# Patient Record
Sex: Male | Born: 1940 | Race: White | Hispanic: No | Marital: Married | Smoking: Heavy tobacco smoker
Health system: Southern US, Community
[De-identification: ages and names within clinical notes are randomized; demographics above are authoritative.]

## PROBLEM LIST (undated history)

## (undated) DIAGNOSIS — I714 Abdominal aortic aneurysm, without rupture, unspecified: Secondary | ICD-10-CM

## (undated) DIAGNOSIS — E119 Type 2 diabetes mellitus without complications: Secondary | ICD-10-CM

## (undated) HISTORY — PX: OTHER SURGICAL HISTORY: SHX169

## (undated) HISTORY — PX: ABDOMINAL AORTIC ANEURYSM REPAIR: SUR1152

## (undated) HISTORY — DX: Type 2 diabetes mellitus without complications: E11.9

---

## 2014-04-03 ENCOUNTER — Ambulatory Visit: Payer: Self-pay | Admitting: Internal Medicine

## 2014-04-04 ENCOUNTER — Ambulatory Visit: Payer: Self-pay | Admitting: Internal Medicine

## 2014-04-30 ENCOUNTER — Ambulatory Visit: Payer: Self-pay | Admitting: Vascular Surgery

## 2014-04-30 DIAGNOSIS — R079 Chest pain, unspecified: Secondary | ICD-10-CM

## 2014-04-30 LAB — BASIC METABOLIC PANEL
Anion Gap: 6 — ABNORMAL LOW (ref 7–16)
BUN: 14 mg/dL (ref 7–18)
CREATININE: 1.03 mg/dL (ref 0.60–1.30)
Calcium, Total: 9.4 mg/dL (ref 8.5–10.1)
Chloride: 106 mmol/L (ref 98–107)
Co2: 28 mmol/L (ref 21–32)
EGFR (African American): >60
EGFR (Non-African Amer.): >60
GLUCOSE: 200 mg/dL — AB (ref 65–99)
OSMOLALITY: 286 (ref 275–301)
POTASSIUM: 4.1 mmol/L (ref 3.5–5.1)
SODIUM: 140 mmol/L (ref 136–145)

## 2014-04-30 LAB — CBC
HCT: 46.9 % (ref 40.0–52.0)
HGB: 15.7 g/dL (ref 13.0–18.0)
MCH: 33.4 pg (ref 26.0–34.0)
MCHC: 33.5 g/dL (ref 32.0–36.0)
MCV: 100 fL (ref 80–100)
PLATELETS: 165 10*3/uL (ref 150–440)
RBC: 4.7 10*6/uL (ref 4.40–5.90)
RDW: 13.2 % (ref 11.5–14.5)
WBC: 6.7 10*3/uL (ref 3.8–10.6)

## 2014-05-14 ENCOUNTER — Inpatient Hospital Stay: Payer: Self-pay | Admitting: Vascular Surgery

## 2014-05-15 LAB — PROTIME-INR
INR: 1.3
Prothrombin Time: 15.5 secs — ABNORMAL HIGH (ref 11.5–14.7)

## 2014-05-15 LAB — CBC WITH DIFFERENTIAL/PLATELET
BASOS ABS: 0 10*3/uL (ref 0.0–0.1)
Basophil %: 0.5 %
EOS ABS: 0 10*3/uL (ref 0.0–0.7)
Eosinophil %: 0.5 %
HCT: 39.1 % — ABNORMAL LOW (ref 40.0–52.0)
HGB: 13.1 g/dL (ref 13.0–18.0)
LYMPHS ABS: 1.2 10*3/uL (ref 1.0–3.6)
Lymphocyte %: 14.8 %
MCH: 33.1 pg (ref 26.0–34.0)
MCHC: 33.5 g/dL (ref 32.0–36.0)
MCV: 99 fL (ref 80–100)
Monocyte #: 0.8 x10 3/mm (ref 0.2–1.0)
Monocyte %: 10.1 %
NEUTROS PCT: 74.1 %
Neutrophil #: 6.2 10*3/uL (ref 1.4–6.5)
PLATELETS: 130 10*3/uL — AB (ref 150–440)
RBC: 3.95 10*6/uL — ABNORMAL LOW (ref 4.40–5.90)
RDW: 12.8 % (ref 11.5–14.5)
WBC: 8.4 10*3/uL (ref 3.8–10.6)

## 2014-05-15 LAB — COMPREHENSIVE METABOLIC PANEL
ALBUMIN: 3.1 g/dL — AB (ref 3.4–5.0)
ALK PHOS: 69 U/L
Anion Gap: 7 (ref 7–16)
BUN: 10 mg/dL (ref 7–18)
Bilirubin,Total: 0.5 mg/dL (ref 0.2–1.0)
CALCIUM: 8.3 mg/dL — AB (ref 8.5–10.1)
Chloride: 109 mmol/L — ABNORMAL HIGH (ref 98–107)
Co2: 25 mmol/L (ref 21–32)
Creatinine: 1.05 mg/dL (ref 0.60–1.30)
EGFR (African American): >60
EGFR (Non-African Amer.): >60
GLUCOSE: 168 mg/dL — AB (ref 65–99)
Osmolality: 284 (ref 275–301)
Potassium: 3.9 mmol/L (ref 3.5–5.1)
SGOT(AST): 17 U/L (ref 15–37)
SGPT (ALT): 15 U/L
Sodium: 141 mmol/L (ref 136–145)
Total Protein: 6.1 g/dL — ABNORMAL LOW (ref 6.4–8.2)

## 2014-05-15 LAB — PHOSPHORUS: Phosphorus: 2.1 mg/dL — ABNORMAL LOW (ref 2.5–4.9)

## 2014-05-15 LAB — APTT: Activated PTT: 28.1 secs (ref 23.6–35.9)

## 2014-05-15 LAB — MAGNESIUM: Magnesium: 1.6 mg/dL — ABNORMAL LOW

## 2014-05-17 LAB — CBC WITH DIFFERENTIAL/PLATELET
BASOS ABS: 0 10*3/uL (ref 0.0–0.1)
Basophil %: 0.2 %
Eosinophil #: 0.2 10*3/uL (ref 0.0–0.7)
Eosinophil %: 1.9 %
HCT: 39.9 % — ABNORMAL LOW (ref 40.0–52.0)
HGB: 13.7 g/dL (ref 13.0–18.0)
Lymphocyte #: 1.2 10*3/uL (ref 1.0–3.6)
Lymphocyte %: 15.6 %
MCH: 33.1 pg (ref 26.0–34.0)
MCHC: 34.2 g/dL (ref 32.0–36.0)
MCV: 97 fL (ref 80–100)
MONO ABS: 0.9 x10 3/mm (ref 0.2–1.0)
Monocyte %: 10.7 %
NEUTROS PCT: 71.6 %
Neutrophil #: 5.7 10*3/uL (ref 1.4–6.5)
PLATELETS: 149 10*3/uL — AB (ref 150–440)
RBC: 4.13 10*6/uL — AB (ref 4.40–5.90)
RDW: 12.8 % (ref 11.5–14.5)
WBC: 7.9 10*3/uL (ref 3.8–10.6)

## 2014-05-17 LAB — BASIC METABOLIC PANEL
ANION GAP: 7 (ref 7–16)
BUN: 8 mg/dL (ref 7–18)
CALCIUM: 8.7 mg/dL (ref 8.5–10.1)
CHLORIDE: 105 mmol/L (ref 98–107)
Co2: 27 mmol/L (ref 21–32)
Creatinine: 0.95 mg/dL (ref 0.60–1.30)
EGFR (African American): >60
EGFR (Non-African Amer.): >60
GLUCOSE: 113 mg/dL — AB (ref 65–99)
OSMOLALITY: 277 (ref 275–301)
Potassium: 3.6 mmol/L (ref 3.5–5.1)
Sodium: 139 mmol/L (ref 136–145)

## 2014-05-20 DIAGNOSIS — I70219 Atherosclerosis of native arteries of extremities with intermittent claudication, unspecified extremity: Secondary | ICD-10-CM | POA: Insufficient documentation

## 2014-05-20 DIAGNOSIS — I739 Peripheral vascular disease, unspecified: Secondary | ICD-10-CM | POA: Insufficient documentation

## 2014-05-21 ENCOUNTER — Ambulatory Visit: Payer: Self-pay | Admitting: Internal Medicine

## 2014-05-21 ENCOUNTER — Encounter: Payer: Self-pay | Admitting: Internal Medicine

## 2014-05-26 ENCOUNTER — Encounter: Payer: Self-pay | Admitting: Internal Medicine

## 2014-06-17 ENCOUNTER — Encounter: Payer: Self-pay | Admitting: Internal Medicine

## 2014-06-24 ENCOUNTER — Encounter
Admit: 2014-06-24 | Disposition: A | Discharge status: Home / Self Care | Payer: Self-pay | Attending: Internal Medicine | Admitting: Internal Medicine

## 2014-06-24 ENCOUNTER — Encounter: Payer: Self-pay | Admitting: Internal Medicine

## 2014-07-25 ENCOUNTER — Encounter
Admit: 2014-07-25 | Disposition: A | Discharge status: Home / Self Care | Payer: Self-pay | Attending: Internal Medicine | Admitting: Internal Medicine

## 2014-08-18 LAB — SURGICAL PATHOLOGY

## 2014-08-24 NOTE — Op Note (Signed)
PATIENT NAME:  Kurt [name], Adger D MR#:  [id number] DATE OF BIRTH:  02/27/1941  DATE OF PROCEDURE:  05/14/2014  PREOPERATIVE DIAGNOSES:  1.  Abdominal aortic aneurysm.  2.  Peripheral arterial disease with claudication, bilateral lower extremities.  3.  Right external iliac artery occlusion.   POSTOPERATIVE DIAGNOSES:   1.  Abdominal aortic aneurysm.  2.  Peripheral arterial disease with claudication, bilateral lower extremities.  3.  Right external iliac artery occlusion.   PROCEDURE:   1.  Ultrasound guidance for vascular access to bilateral femoral arteries.  2.  Cut down the bilateral femoral arteries for placement of endoprosthesis.  3.  Crosser atherectomy to right iliac artery.  4.  Placement of coil embolization of the right common iliac artery for extravasation and exclusion of aneurysm.  5.  Placement of an aorto-uni-iliac iliac device using a 31 mm proximal Gore Excluder placed twice to create the aorto-uni-iliac device.  6.  Placement of a left iliac extension with an 18 mm bellbottom cuff to the left common iliac artery.  7.  Right common femoral, profunda femoris and superficial femoral endarterectomy.  8.  Placement of a left to right femoral-femoral bypass.  SURGEONS:  Dr. Marlow BaarsJason S. Errik Mitchelle and Dr. Renford DillsGregory G. Schnier.   ANESTHESIA: General.   ESTIMATED BLOOD LOSS: 100 mL.  INDICATIONS AND PROCEDURE:  This is a 74 year old gentleman who I saw in the office for abdominal aortic aneurysm. He also had lifestyle limiting claudication of both lower extremities with multilevel disease.  He had a right external iliac artery occlusion and we planned repair of his aneurysm. We had discussed crossing the occlusion if possible, and treating this with conventional stent graft and if not possible creating an aorto-uni-iliac stent in the femoral-femoral bypass. Risks and benefits were discussed. Informed consent was obtained.   DESCRIPTION OF PROCEDURE: The patient is brought to the vascular  suite and was placed on the table in the supine position. General anesthesia was achieved. The abdomen and groins were sterilely prepped and draped and a sterile surgical field was created. We started with a percutaneous approach with ultrasound guidance for both femoral arteries, and patent areas.  Dr. Gilda CreaseSchnier performed the left and I performed the right, and permanent image was recorded. On the right we upsized to a 6 French sheath and imaging was performed showing the right iliac occlusion with reconstitution in the common iliac artery. We approached this occlusion both from a retrograde fashion from the right common femoral, and an antegrade fashion from an up and over approach from the left femoral and despite multiple attempts with different catheters and wires were unable to cross the occlusion.  We switched out for the crosser atherectomy device using the Usher catheter in the S6 device, but were unable to cross the occlusion and, again, tried from both an antegrade and retrograde fashion. From the antegrade fashion there was actually some extravasation in the distal common iliac artery to treat this area as well as to exclude the aneurysm sac from retrograde flow.  Five 20 mm diameter coils were placed in the right common iliac artery, and we did this from the left femoral approach. We then proceeded with stiff wire and an 18 French sheath on the left. Pigtail catheter was placed up.  We were able to pass into a dissection plane in the common iliac and the aorta from the right femoral approach but could never re-enter the true lumen. The renal arteries were marked at about the same level  and we started with a 31 mm device using a short 13 cm length device.  A second device was placed up through there to create an aorto-uni-iliac device, crushing the contralateral limb and excluding the initial contralateral limb.  This functionally creates an aorto-uni-iliac device that terminated in the proximal left  common iliac artery. 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 At this point, it should be noted we had converted to full femoral artery cut downs for placement of the endoprosthesis on the left and planned femoral-femoral bypass on the right. At this point vertical incisions had been created. The 18 French sheath was removed on the left and the 6 French sheath was removed on the right. Vertical arteriotomies were created, on the right side there was dense plaque in the common femoral, profunda femoris, and proximal superficial femoral artery.  I elected to perform an endarterectomy. a nice eversion endarterectomy was performed on the profunda femoris artery with a very long piece of plaque that was removed and brisk backbleeding. The proximal endpoint was cut flush with tenotomy scissors on the common femoral artery and again, an eversion endarterectomy was performed on the proximal superficial femoral artery with good back bleed. After the specimen was removed all loose flecks were removed and the vessel was locally heparinized. We selected a 6 mm diameter ring Propaten PTFE graft and tunneled this in a suprafascial location. The femoral arteries anastomoses were performed with CV-6 sutures on each side after the grafts were cut and beveled to an appropriate length to match the arteriotomies.  Two CV-6 patch sutures was used on the left, the wounds were then irrigated. There was excellent pulse within the graft and in the femoral arteries distal to the graft on the left and the right. Surgicel and Evicel topical hemostatic agents were placed and hemostasis complete. The wound was then closed with a layer of 2-0 Vicryl, two layers of 3-0 Vicryl and 4-0 Monocryl in each side.  The patient was then awakened from anesthesia and taken  to the recovery room in stable condition, having tolerated the procedure well.      ____________________________ Annice Needy, MD jsd:at D: 05/14/2014 17:08:27 ET T: 05/14/2014 17:49:36 ET JOB#: [id number]  cc: Annice Needy, MD, <Dictator> Jillene Bucks. Arlana Pouch, MD Annice Needy MD ELECTRONICALLY SIGNED 05/15/2014 10:41

## 2014-08-24 NOTE — Op Note (Signed)
PATIENT NAME:  Kurt [name], Kurt [name] MR#:  [id number] DATE OF BIRTH:  1940/09/14  DATE OF PROCEDURE:  05/14/2014  PREOPERATIVE DIAGNOSES:  1. Abdominal aortic aneurysm.  2. Right external iliac occlusion.  3. Atherosclerosis bilateral lower extremities with lifestyle limiting claudication.   POSTOPERATIVE DIAGNOSES:  1. Abdominal aortic aneurysm.  2. Right external iliac occlusion.  3. Atherosclerosis bilateral lower extremities with lifestyle limiting claudication.   PROCEDURES PERFORMED:   1. Endovascular repair of abdominal aortic aneurysm using Gore Excluder in a uni-iliac configuration.  2. Coil embolization of the right common iliac secondary to extravasation.  3. Crosser atherectomy of right external iliac artery unsuccessful, complicated with extravasation of the contrast.  4. Right common femoral artery endarterectomy.  5. Right profunda femoris endarterectomy.  6. Femoral to femoral bypass grafting.   SURGEON: Festus BarrenJason Dew, MD, Levora DredgeGregory Schnier, MD, co-surgeons.   ANESTHESIA: General by endotracheal intubation.   FLUIDS: Per anesthesia record.   ESTIMATED BLOOD LOSS: 100 mL.   FLUOROSCOPY TIME: Per interventional record.    CONTRAST USED: Approximately 70 mL.   COMPLICATIONS: Extravasation during attempted atherectomy and crossing of occluded lesion.   INDICATIONS: Mr. Lanae BoastGarner is a 74 year old gentleman who presented to the office for evaluation of abdominal aortic aneurysm. It was found to be greater than 5 cm and he has been worked up by Dr. Wyn Quakerew for further repair to prevent lethal rupture. Risks and benefits were reviewed with the patient by Dr. Wyn Quakerew. All questions answered. He has agreed to proceed.   DESCRIPTION OF PROCEDURE: The patient is taken to the special procedure suite, placed in the supine position. After adequate general anesthesia is induced and appropriate invasive monitors are placed he is positioned supine and he is prepped and draped in a sterile fashion.  Appropriate timeout is called.   With Dr. Wyn Quakerew working on the right and myself working on the  left, ultrasound is utilized to evaluate the common femoral arteries. Common femoral arteries are identified. They are echolucent and pulsatile indicating patency. Image is recorded for the permanent record and on the right side access is obtained with a Seldinger needle under direct visualization, on the left side access is obtained with a microneedle followed by micro sheath, followed by J-wire and a 6 French sheath. On the left side 2 Perclose devices are then deployed in a pre-close fashion and the sheath is upsized to an 8 [place]French.   Initially working both from the right femoral approach as well as crossing over the bifurcation and working from the left femoral approach, attempts at crossing the occluded iliac on the right side are made. They are not successful. Ultimately a Crosser catheter is deployed from the right as well as from the left. Unfortunately on the left side after it appeared the Crosser had engaged the iliac and had begun to work its way through indeed we encountered extravasation. Based on the inability to cross, but of concern also is the active bleeding, it was elected to coil embolize the common iliac distally. This was done with 6 coils all 20 x 14 length. They were deployed without difficulty. We then committed to an aorta-uni-iliac configuration.   Bilateral femoral cutdowns were then performed, making vertical incisions with Dr. Wyn Quakerew again working on the right and myself working on the left. Common femoral arteries exposed, on the left it is looped proximally and distally, on the right the dissection is carried down to loop the origin of the SFA as well as the profunda femoris. Tunnel  is created with a Gore tunneling device.   The sheath on the left side is then upsized over an Amplatz Super Stiff wire to 18 [place]. Pigtail catheter is then inserted and an AP aortogram is obtained, noting  the level of the renals. Subsequently a main body device, a 31 x 14 x 13 was deployed just below the renals. The second main body 31 by 14 x 13 is then deployed in a reverse orientation into the original, this effectively occludes the contralateral limb extending the ipsilateral down.  An extension with an 18 x 10 extender limb was then deployed creating a seal in the left common iliac. The entire system was then ballooned with a Coda balloon. A 12 x 4 by angioplasty balloon was then also used to dilate an area in the iliac.  Then the system was flushed copiously with heparinized saline. The final imaging of the aneurysm demonstrated excellent flow through the system with no evidence of endoleak.   A 6 mm ringed PTFE graft was then pulled through the subcutaneous tunnel. The proximal and distal left common femoral was occluded as was the right side. Arteriotomy was made with Potts scissors. The graft was beveled and an end graft to side common femoral artery anastomosis was fashioned with running CV-6 suture.   After adjusting for length on the left side again the common femoral artery was opened in a vertical fashion with Potts scissors. Significant plaque was noted and the common femoral and profunda femoris were then treated with femoral endarterectomy and a specimen was sent to pathology for permanent section. The graft was then beveled and an end graft to side femoral artery anastomosis was fashioned with running CV-6 suture. Flushing maneuvers were performed and flow was established through the graft. It should be noted that the flushing maneuvers were performed prior to completing the suture line on the right side to allow for appropriate purging of the graft.   Both suture lines were inspected for hemostasis. Subsequently the wounds were irrigated, reinspected for hemostasis as well, and then Evicel and Surgicel were placed in both wounds and both wounds were closed in layers using 2-0 and 3-0 Vicryl  over approximately 4 levels of closure and then 4-0 Monocryl subcuticular for the skin.   The patient tolerated the procedure well. There were no immediate complications. Sponge and needle counts were correct and he was taken to the recovery area in excellent condition.  ____________________________ Renford Dills, MD ggs:bu [name]: 05/14/2014 17:38:00 ET T: 05/14/2014 20:00:18 ET JOB#: [id number]  cc: Renford Dills, MD, <Dictator> Renford Dills MD ELECTRONICALLY SIGNED 05/28/2014 17:43

## 2014-08-24 NOTE — Discharge Summary (Signed)
PATIENT NAME:  Kurt [name], Champ D MR#:  [id number] DATE OF BIRTH:  1941/02/07  DATE OF ADMISSION:  05/14/2014 DATE OF DISCHARGE:  05/20/2014   ADMITTING AND DISCHARGE DIAGNOSES:  1.  Peripheral arterial disease claudication, bilateral lower extremities.  2.  Abdominal aortic aneurysm.  3.  Right iliac artery occlusion.  4.  Tobacco dependence.   PROCEDURES PERFORMED WHILE IN HOSPITAL: Aorta uni-iliac with femoral to femoral bypass and right femoral endarterectomy.  For full details of that, please see that dictated operative summary.   BRIEF HISTORY: A 74 year old gentleman with the above-mentioned issues. He was prepared for elective surgery to address them as an outpatient and was brought into the hospital on 05/14/2014.   HOSPITAL COURSE: The patient was admitted after surgery on 05/14/2014.  He was monitored in the Critical Care Unit overnight and was able to be transferred to the floor on day 1.  His mobility was reasonably poor to baseline and this did not advance, particularly well, even with physical therapy seeing the patient on multiple occasions.  By postoperative day 3, he was advanced to a regular diet. He was able to be off of IV pain medications and was afebrile. Vital signs stable, but his mobility was not good.  It was recommended he get inpatient rehab at discharge and by postoperative day 6 this was available and ready.  The patient will be discharged to rehab accompanied by family. His diet will be regular. His activity will be as tolerated with physical therapy daily.   He had aspirin, Plavix, and pain medications added after surgery and was resuming all of his previous home medications.  He will see us back in the office in 3 to 4 weeks in follow-up.     ____________________________ Annice NeedyJason S. Brenner Visconti, MD jsd:DT D: 05/20/2014 15:31:57 ET T: 05/20/2014 16:01:49 ET JOB#: [id number]  cc: Annice NeedyJason S. Taraneh Metheney, MD, <Dictator> Annice NeedyJASON S Cassidey Barrales MD ELECTRONICALLY SIGNED 05/28/2014 11:53

## 2015-01-26 ENCOUNTER — Other Ambulatory Visit: Payer: Self-pay | Admitting: Neurology

## 2015-01-26 DIAGNOSIS — M48062 Spinal stenosis, lumbar region with neurogenic claudication: Secondary | ICD-10-CM

## 2015-01-26 DIAGNOSIS — G9519 Other vascular myelopathies: Secondary | ICD-10-CM

## 2015-02-03 ENCOUNTER — Ambulatory Visit
Admission: RE | Admit: 2015-02-03 | Discharge: 2015-02-03 | Disposition: A | Discharge status: Home / Self Care | Payer: Medicare PPO | Source: Ambulatory Visit | Attending: Neurology | Admitting: Neurology

## 2015-02-03 DIAGNOSIS — M4806 Spinal stenosis, lumbar region: Secondary | ICD-10-CM | POA: Diagnosis not present

## 2015-02-03 DIAGNOSIS — G9519 Other vascular myelopathies: Secondary | ICD-10-CM

## 2015-02-03 DIAGNOSIS — M48062 Spinal stenosis, lumbar region with neurogenic claudication: Secondary | ICD-10-CM

## 2015-02-18 ENCOUNTER — Ambulatory Visit: Payer: Medicare PPO | Attending: Neurology | Admitting: Physical Therapy

## 2015-02-18 ENCOUNTER — Encounter: Payer: Self-pay | Admitting: Physical Therapy

## 2015-02-18 DIAGNOSIS — R2681 Unsteadiness on feet: Secondary | ICD-10-CM | POA: Diagnosis present

## 2015-02-18 DIAGNOSIS — R262 Difficulty in walking, not elsewhere classified: Secondary | ICD-10-CM | POA: Diagnosis present

## 2015-02-18 DIAGNOSIS — R531 Weakness: Secondary | ICD-10-CM | POA: Diagnosis present

## 2015-02-18 NOTE — Therapy (Signed)
[facility] MAIN [facility] SERVICES [address] [place], [place], [address] Phone: [phone]   Fax:  [phone]  Physical Therapy Evaluation  Patient Details  Name: Kurt [name] MRN: [id number] Date of Birth: 02/13/1941 Referring Provider: shah  Encounter Date: 02/18/2015      PT End of Session - 02/18/15 1002    Visit Number 1   Number of Visits 25   Date for PT Re-Evaluation May 18, 2014   Authorization Type g codes   PT Start Time 0915   PT Stop Time 1015   PT Time Calculation (min) 60 min   Equipment Utilized During Treatment Gait belt   Activity Tolerance Patient limited by lethargy;Patient limited by fatigue;Patient limited by pain   Behavior During Therapy [facility] for tasks assessed/performed      No past medical history on file.  No past surgical history on file.  There were no vitals filed for this visit.  Visit Diagnosis:  Weakness  Unsteady gait  Difficulty walking      Subjective Assessment - 02/18/15 0923    Subjective Patient has unsteady gait and uses a spc outdoors.    Patient is accompained by: Family member   Currently in Pain? Yes   Pain Score 0-No pain   Pain Location Hip   Pain Orientation Right   Pain Descriptors / Indicators Aching   Pain Type Chronic pain   Pain Onset More than a month ago   Pain Frequency Intermittent        PAIN: intermittent right hip pain 0/10-8/10  POSTURE: WFL   PROM/AROM:  STRENGTH:  Graded on a 0-5 scale Muscle Group Left Right  Shoulder flex    Shoulder Abd    Shoulder Ext    Shoulder IR/ER    Elbow    Wrist/hand    Hip Flex 3+ 3+  Hip Abd 3 3  Hip Add 2 2  Hip Ext 2 2  Hip IR/ER 3 3  Knee Flex 4 4  Knee Ext 4 4  Ankle DF 4 4  Ankle PF 4 4   SENSATION: Numbness to RLE calf intermittent      FUNCTIONAL MOBILITY:   BALANCE: 44/56 berg balance, unable to single leg stand , unable to tandem stand   GAIT: Patient is able to ambulate with spc and  shuffling feet, decreased step length and decreased step height  OUTCOME MEASURES: TEST Outcome Interpretation  5 times sit<>stand 18.74sec >11 yo, >15 sec indicates increased risk for falls  10 meter walk test    . 76             m/s <1.0 m/s indicates increased risk for falls; limited community ambulator  Timed up and Go   14.16               sec <14 sec indicates increased risk for falls  6 minute walk test    650            Feet 1000 feet is community Forensic scientist Assessment 44/56 <36/56 (100% risk for falls), 37-45 (80% risk for falls); 46-51 (>50% risk for falls); 52-55 (lower risk <25% of falls)             [facility] PT Assessment - 02/18/15 0001    Assessment   Medical Diagnosis unsteady gait   Referring Provider shah   Hand Dominance Right   Next MD Visit 04/25/14   Balance Screen   Has the patient fallen  in the past 6 months Yes   How many times? 7   Has the patient had a decrease in activity level because of a fear of falling?  Yes   Is the patient reluctant to leave their home because of a fear of falling?  No   Home [employer] manag[employer]vironment   Living Environment Private residence   Living Arrangements Spouse/significant other   Available Help at Discharge Family   Type of Home House   Home Access Stairs to enter   Entrance Stairs-Number of Steps 2   Entrance Stairs-Rails Left   Home Layout One level   Home Equipment Walker - 2 wheels;Cane - single point   Prior Function   Level of Independence Independent   Vocation Retired     Therapeutic exercise: Leg press  90 lbsx 20 x 3 sets, heel raises 90 lbs x 20 x 3 sets Standing 4 way hip YTB x 20                           PT Long Term Goals - 02/18/15 1006    PT LONG TERM GOAL #1   Title Patient will increase BLE gross strength to 4+/5 as to improve functional strength for independent gait, increased standing tolerance and increased ADL ability   Time 12   Period Weeks   Status New   PT LONG  TERM GOAL #2   Title Patient will report a worst pain of 3/10 on VAS in right hip            to improve tolerance with ADLs and reduced symptoms with activities.    Time 12   Status New   PT LONG TERM GOAL #3   Title Patient will tolerate 5 seconds of single leg stance without loss of balance to improve ability to get in and out of shower safely.   Time 12   Period Weeks   Status New   PT LONG TERM GOAL #4   Title Patient will demonstrate an improved Berg Balance Score of > as to demonstrate improved balance with ADLs such as sitting/standing and transfer balance and reduced fall risk   Time 12   Period Weeks   Status New   PT LONG TERM GOAL #5   Title Patient will increase six minute walk test distance to >1000 for progression to community ambulator and improve gait ability   Time 12   Period Weeks   Status New               Plan - 02/18/15 1003    Clinical Impression Statement Patient is 74 yr old male with unsteady gait, decreased dynamic standing balance, decreased strength and right hip pain. He has decreased outcome measures  that indicate increased falls risk.    Pt will benefit from skilled therapeutic intervention in order to improve on the following deficits Abnormal gait;Decreased balance;Decreased endurance;Decreased mobility;Difficulty walking;Dizziness;Decreased activity tolerance;Decreased strength;Pain   Rehab Potential Good   PT Frequency 2x / week   PT Duration 12 weeks   PT Treatment/Interventions Manual techniques;Balance training;Neuromuscular re-education;Therapeutic exercise;Therapeutic activities;Gait training;Stair training;Cryotherapy;Canalith Repostioning;Electrical Stimulation   PT Next Visit Plan Elys maneuver, balance training, strengthening   PT Home Exercise Plan 4 way hip, sit to stand   Consulted and Agree with Plan of Care Patient          G-Codes - 02/18/15 1016    Functional Assessment Tool Used tug, 5 x sit to stand,  BERG, 6 MW    Functional Limitation Mobility: Walking and moving around   Mobility: Walking and Moving Around Current Status [phone]) At least 40 percent but less than 60 percent impaired, limited or restricted   Mobility: Walking and Moving Around Goal Status [phone]) At least 20 percent but less than 40 percent impaired, limited or restricted       Problem List There are no active problems to display for this patient.   Kurt [name] 02/18/2015, 10:18 AM  [facility] MAIN [facility] SERVICES [address] [place], [place], [address] Phone: [phone]   Fax:  [phone]  Name: Kurt [name] MRN: [id number] Date of Birth: 03-Jul-1940

## 2015-02-23 ENCOUNTER — Ambulatory Visit: Payer: Medicare PPO | Admitting: Physical Therapy

## 2015-02-23 ENCOUNTER — Encounter: Payer: Self-pay | Admitting: Physical Therapy

## 2015-02-23 DIAGNOSIS — R531 Weakness: Secondary | ICD-10-CM | POA: Diagnosis not present

## 2015-02-23 DIAGNOSIS — R262 Difficulty in walking, not elsewhere classified: Secondary | ICD-10-CM

## 2015-02-23 DIAGNOSIS — R2681 Unsteadiness on feet: Secondary | ICD-10-CM

## 2015-02-23 NOTE — Therapy (Signed)
[place] [facility] MAIN [facility] SERVICES [address] [place], [place], [address] Phone: [phone]   Fax:  [phone]  Physical Therapy Treatment  Patient Details  Name: Kurt [name] MRN: [id number] Date of Birth: 10-06-1940 Referring Provider: shah  Encounter Date: 02/23/2015      PT End of Session - 02/23/15 0854    Visit Number 2   Number of Visits 25   Date for PT Re-Evaluation 05/26/2014   Authorization Type g codes   PT Start Time 0830   PT Stop Time 0915   PT Time Calculation (min) 45 min   Equipment Utilized During Treatment Gait belt   Activity Tolerance Patient limited by lethargy;Patient limited by fatigue;Patient limited by pain   Behavior During Therapy [facility] for tasks assessed/performed      No past medical history on file.  No past surgical history on file.  There were no vitals filed for this visit.  Visit Diagnosis:  Weakness  Unsteady gait  Difficulty walking      Subjective Assessment - 02/23/15 0852    Subjective Patient has unsteady gait and uses a spc outdoors. Patient is not having any pain today in his hip     standing on balance foam and tapping 6 inch stool Standing from stool and eccentric step downs x 10 BLE  standing hip abd with YTB x 20  side stepping left and right in parallel bars 10 feet x 3 standing on blue foam with cone reaching x 20 across midline step ups from floor to 6 inch stool x 20 bilateral sit to stand x 10 marching in parallel bars x 20 stepping pattern with weight shifting fwd/bwd x 10.  Min cueing needed to appropriately perform  tasks with leg, hand, and head position. Decreased coordination demonstrated requiring consistent verbal cueing to correct form. Patient continues to demonstrate some in coordination of movement with select exercises such as rock and reach and stepping backwards. Patient responds well to verbal and tactile cues to correct form and technique.  CGA to SBA for  safety with activities.                          PT Education - 02/23/15 0854    Education provided Yes   Person(s) Educated Patient   Methods Explanation   Comprehension Verbalized understanding             PT Long Term Goals - 02/18/15 1006    PT LONG TERM GOAL #1   Title Patient will increase BLE gross strength to 4+/5 as to improve functional strength for independent gait, increased standing tolerance and increased ADL ability   Time 12   Period Weeks   Status New   PT LONG TERM GOAL #2   Title Patient will report a worst pain of 3/10 on VAS in right hip            to improve tolerance with ADLs and reduced symptoms with activities.    Time 12   Status New   PT LONG TERM GOAL #3   Title Patient will tolerate 5 seconds of single leg stance without loss of balance to improve ability to get in and out of shower safely.   Time 12   Period Weeks   Status New   PT LONG TERM GOAL #4   Title Patient will demonstrate an improved Berg Balance Score of > as to demonstrate improved balance with ADLs  such as sitting/standing and transfer balance and reduced fall risk   Time 12   Period Weeks   Status New   PT LONG TERM GOAL #5   Title Patient will increase six minute walk test distance to >1000 for progression to community ambulator and improve gait ability   Time 12   Period Weeks   Status New               Plan - 02/23/15 [id number]    Clinical Impression Statement PT provided min - moderate verbal instruction to improve set up, proper use of UE, and improved posture and gait mechanics. Patient responded moderately to instruction   Pt will benefit from skilled therapeutic intervention in order to improve on the following deficits Abnormal gait;Decreased balance;Decreased endurance;Decreased mobility;Difficulty walking;Dizziness;Decreased activity tolerance;Decreased strength;Pain   Rehab Potential Good   PT Frequency 2x / week   PT Duration 12 weeks    PT Treatment/Interventions Manual techniques;Balance training;Neuromuscular re-education;Therapeutic exercise;Therapeutic activities;Gait training;Stair training;Cryotherapy;Canalith Repostioning;Electrical Stimulation   PT Next Visit Plan Elys maneuver, balance training, strengthening   PT Home Exercise Plan 4 way hip, sit to stand   Consulted and Agree with Plan of Care Patient        Problem List There are no active problems to display for this patient.   Ezekiel InaMansfield, Dina Mobley S 02/23/2015, 9:01 AM  [place] [facility] [address] [place], [place], [address] Phone: [phone]   Fax:  [phone]  Name: Kurt [name] MRN: [id number] Date of Birth: 16-Sep-1940

## 2015-02-25 ENCOUNTER — Encounter: Payer: Self-pay | Admitting: Physical Therapy

## 2015-02-25 ENCOUNTER — Ambulatory Visit: Payer: Medicare PPO | Attending: Neurology | Admitting: Physical Therapy

## 2015-02-25 DIAGNOSIS — R2681 Unsteadiness on feet: Secondary | ICD-10-CM | POA: Diagnosis present

## 2015-02-25 DIAGNOSIS — R262 Difficulty in walking, not elsewhere classified: Secondary | ICD-10-CM | POA: Insufficient documentation

## 2015-02-25 DIAGNOSIS — R531 Weakness: Secondary | ICD-10-CM

## 2015-02-25 NOTE — Therapy (Signed)
[place] [facility] [address] [place], [place], [address] Phone: [phone]   Fax:  [phone]  Physical Therapy Treatment  Patient Details  Name: Kurt [name] MRN: [id number] Date of Birth: Oct 09, 1940 Referring Provider: shah  Encounter Date: 02/25/2015      PT End of Session - 02/25/15 0853    Visit Number 3   Number of Visits 25   Date for PT Re-Evaluation 05/12/14   Authorization Type g codes   PT Start Time 0830   PT Stop Time 0915   PT Time Calculation (min) 45 min   Equipment Utilized During Treatment Gait belt   Activity Tolerance Patient limited by lethargy;Patient limited by fatigue;Patient limited by pain   Behavior During Therapy [facility] for tasks assessed/performed      History reviewed. No pertinent past medical history.  History reviewed. No pertinent past surgical history.  There were no vitals filed for this visit.  Visit Diagnosis:  Weakness  Unsteady gait  Difficulty walking      Subjective Assessment - 02/25/15 0852    Subjective Patient has unsteady gait and uses a spc outdoors. Patient is not having any pain today in his hip, but he feels a little light headed.   Patient is accompained by: Family member   Pain Score 0-No pain   Pain Onset More than a month ago        standing hip abd with YTB x 20 x 2 Eccentric step downs from 6 inch stool x 10 x 2 Standing on 1/2 foam with correct head position side stepping left and right in parallel bars 10 feet x 3 standing on blue foam with cone reaching x 20 across midline step ups from floor to 6 inch stool x 20 bilateral sit to stand x 10 marching in parallel bars x 20 stepping pattern with weight shifting fwd/bwd x 10. . Min cueing needed to appropriately perform tasks with leg, hand, and head position. Decreased coordination demonstrated requiring consistent verbal cueing to correct form.  Patient continues to demonstrate some in  coordination of movement with select exercises such as stepping backwards. Patient responds well to verbal and tactile cues to correct form and technique.  CGA to SBA for safety with activities.  Uses to increase intensity of movements throughout session                          PT Education - 02/25/15 0853    Education provided Yes   Person(s) Educated Patient   Methods Explanation   Comprehension Verbalized understanding             PT Long Term Goals - 02/18/15 1006    PT LONG TERM GOAL #1   Title Patient will increase BLE gross strength to 4+/5 as to improve functional strength for independent gait, increased standing tolerance and increased ADL ability   Time 12   Period Weeks   Status New   PT LONG TERM GOAL #2   Title Patient will report a worst pain of 3/10 on VAS in right hip            to improve tolerance with ADLs and reduced symptoms with activities.    Time 12   Status New   PT LONG TERM GOAL #3   Title Patient will tolerate 5 seconds of single leg stance without loss of balance to improve ability to get in and out of shower  safely.   Time 12   Period Weeks   Status New   PT LONG TERM GOAL #4   Title Patient will demonstrate an improved Berg Balance Score of > as to demonstrate improved balance with ADLs such as sitting/standing and transfer balance and reduced fall risk   Time 12   Period Weeks   Status New   PT LONG TERM GOAL #5   Title Patient will increase six minute walk test distance to >1000 for progression to community ambulator and improve gait ability   Time 12   Period Weeks   Status New               Plan - 02/25/15 0854    Clinical Impression Statement Patient is negative for Epley test L and R . Patient feels light headed and he tested his sugar today and it was 175 this AM.  He participates in strengthening and balance training with improvement with practice.    Pt will benefit from skilled therapeutic intervention  in order to improve on the following deficits Abnormal gait;Decreased balance;Decreased endurance;Decreased mobility;Difficulty walking;Dizziness;Decreased activity tolerance;Decreased strength;Pain   Rehab Potential Good   PT Frequency 2x / week   PT Duration 12 weeks   PT Treatment/Interventions Manual techniques;Balance training;Neuromuscular re-education;Therapeutic exercise;Therapeutic activities;Gait training;Stair training;Cryotherapy;Canalith Repostioning;Electrical Stimulation   PT Next Visit Plan Elys maneuver, balance training, strengthening   PT Home Exercise Plan 4 way hip, sit to stand   Consulted and Agree with Plan of Care Patient        Problem List There are no active problems to display for this patient.   Ezekiel Ina 02/25/2015, 9:16 AM  [place] [facility] MAIN [facility] SERVICES [address] [place], [place], [address] Phone: [phone]   Fax:  [phone]  Name: Kurt [name] MRN: [id number] Date of Birth: 25-Jul-1940

## 2015-03-02 ENCOUNTER — Encounter: Payer: Self-pay | Admitting: Physical Therapy

## 2015-03-02 ENCOUNTER — Ambulatory Visit: Payer: Medicare PPO | Admitting: Physical Therapy

## 2015-03-02 DIAGNOSIS — R531 Weakness: Secondary | ICD-10-CM | POA: Diagnosis not present

## 2015-03-02 DIAGNOSIS — R262 Difficulty in walking, not elsewhere classified: Secondary | ICD-10-CM

## 2015-03-02 DIAGNOSIS — R2681 Unsteadiness on feet: Secondary | ICD-10-CM

## 2015-03-02 NOTE — Therapy (Signed)
[facility] [address] [place], [place], [address] Phone: [phone]   Fax:  [phone]  Physical Therapy Treatment  Patient Details  Name: Kurt GipLarry D [name] MRN: [id number] Date of Birth: 1940/11/26 Referring Provider: shah  Encounter Date: 03/02/2015      PT End of Session - 03/02/15 0839    Visit Number 4   Number of Visits 25   Date for PT Re-Evaluation 05/12/14   Authorization Type g codes   PT Start Time 0830   PT Stop Time 0915   PT Time Calculation (min) 45 min   Equipment Utilized During Treatment Gait belt   Activity Tolerance Patient limited by lethargy;Patient limited by fatigue;Patient limited by pain   Behavior During Therapy [facility] for tasks assessed/performed      History reviewed. No pertinent past medical history.  History reviewed. No pertinent past surgical history.  There were no vitals filed for this visit.  Visit Diagnosis:  Weakness  Unsteady gait  Difficulty walking  leg press 90 lbs x 20 x 3 Eccentric step downs x 10 x 3 BLE  standing hip abd with YTB x 20  side stepping left and right in parallel bars 10 feet x 3 standing on blue foam with cone reaching x 20 across midline step ups from floor to 6 inch stool x 20 bilateral sit to stand x 10 marching in parallel bars x 20 stepping pattern with weight shifting fwd/bwd x 10.  Min cueing needed to appropriately perform  tasks with leg, hand, and head position. . Patient continues to demonstrate some in coordination of movement with select exercises such as rock and reach and stepping backwards. Patient responds well to verbal and tactile cues to correct form and technique.  CGA to SBA for safety with activities.  Uses to increase intensity and amplitude of movements throughout session                            PT Education - 03/02/15 0839    Education provided Yes   Education Details progression of strengthening  exercises   Person(s) Educated Patient   Methods Explanation   Comprehension Verbalized understanding             PT Long Term Goals - 02/18/15 1006    PT LONG TERM GOAL #1   Title Patient will increase BLE gross strength to 4+/5 as to improve functional strength for independent gait, increased standing tolerance and increased ADL ability   Time 12   Period Weeks   Status New   PT LONG TERM GOAL #2   Title Patient will report a worst pain of 3/10 on VAS in right hip            to improve tolerance with ADLs and reduced symptoms with activities.    Time 12   Status New   PT LONG TERM GOAL #3   Title Patient will tolerate 5 seconds of single leg stance without loss of balance to improve ability to get in and out of shower safely.   Time 12   Period Weeks   Status New   PT LONG TERM GOAL #4   Title Patient will demonstrate an improved Berg Balance Score of > as to demonstrate improved balance with ADLs such as sitting/standing and transfer balance and reduced fall risk   Time 12   Period Weeks   Status New   PT  LONG TERM GOAL #5   Title Patient will increase six minute walk test distance to >1000 for progression to community ambulator and improve gait ability   Time 12   Period Weeks   Status New               Plan - 03/02/15 0840    Clinical Impression Statement Patient reports that his right hip is not hurting this AM. He is able to perform LE strengthening exercises and balance training without increased pdain.    Pt will benefit from skilled therapeutic intervention in order to improve on the following deficits Abnormal gait;Decreased balance;Decreased endurance;Decreased mobility;Difficulty walking;Dizziness;Decreased activity tolerance;Decreased strength;Pain   Rehab Potential Good   PT Frequency 2x / week   PT Duration 12 weeks   PT Treatment/Interventions Manual techniques;Balance training;Neuromuscular re-education;Therapeutic exercise;Therapeutic  activities;Gait training;Stair training;Cryotherapy;Canalith Repostioning;Electrical Stimulation   PT Next Visit Plan Elys maneuver, balance training, strengthening   PT Home Exercise Plan 4 way hip, sit to stand   Consulted and Agree with Plan of Care Patient        Problem List There are no active problems to display for this patient.   Ezekiel Ina 03/02/2015, 8:41 AM  [place] [facility] MAIN [facility] SERVICES [address] [place], [place], [address] Phone: [phone]   Fax:  [phone]  Name: Kurt [name] MRN: [id number] Date of Birth: Sep 06, 1940

## 2015-03-04 ENCOUNTER — Encounter: Payer: Self-pay | Admitting: Physical Therapy

## 2015-03-04 ENCOUNTER — Ambulatory Visit: Payer: Medicare PPO | Admitting: Physical Therapy

## 2015-03-04 DIAGNOSIS — R531 Weakness: Secondary | ICD-10-CM

## 2015-03-04 DIAGNOSIS — R2681 Unsteadiness on feet: Secondary | ICD-10-CM

## 2015-03-04 DIAGNOSIS — R262 Difficulty in walking, not elsewhere classified: Secondary | ICD-10-CM

## 2015-03-04 NOTE — Therapy (Signed)
[facility] [facility] [address] [place], [place], [address] Phone: [phone]   Fax:  [phone]  Physical Therapy Treatment  Patient Details  Name: Kurt [name] MRN: [id number] Date of Birth: 1940/11/08 Referring Provider: shah  Encounter Date: 03/04/2015      PT End of Session - 03/04/15 0850    Visit Number 5   Number of Visits 25   Date for PT Re-Evaluation 05/12/14   Authorization Type g codes   PT Start Time 0836   PT Stop Time 0915   PT Time Calculation (min) 39 min   Equipment Utilized During Treatment Gait belt   Activity Tolerance Patient limited by lethargy;Patient limited by fatigue;Patient limited by pain   Behavior During Therapy [facility] for tasks assessed/performed      History reviewed. No pertinent past medical history.  History reviewed. No pertinent past surgical history.  There were no vitals filed for this visit.  Visit Diagnosis:  Weakness  Unsteady gait  Difficulty walking      Subjective Assessment - 03/04/15 0848    Subjective Patient is not having right leg pain but does have some soreness.    Patient is accompained by: Family member   Pain Onset More than a month ago      standing hip abd with YTB x 20  side stepping left and right in parallel bars 10 feet x 3 standing on blue foam with cone reaching x 20 across midline step ups from floor to 6 inch stool x 20 bilateral sit to stand x 10 marching in parallel bars x 20 stepping pattern with weight shifting fwd/bwd x 10.  Patient needs occasional verbal cueing to improve posture and cueing to correctly perform exercises slowly, holding at end of range to increase motor firing of desired muscle to encourage fatigue.                            PT Education - 03/04/15 0849    Education provided Yes   Education Details progression of HEP   Person(s) Educated Patient   Methods Explanation   Comprehension  Verbalized understanding             PT Long Term Goals - 02/18/15 1006    PT LONG TERM GOAL #1   Title Patient will increase BLE gross strength to 4+/5 as to improve functional strength for independent gait, increased standing tolerance and increased ADL ability   Time 12   Period Weeks   Status New   PT LONG TERM GOAL #2   Title Patient will report a worst pain of 3/10 on VAS in right hip            to improve tolerance with ADLs and reduced symptoms with activities.    Time 12   Status New   PT LONG TERM GOAL #3   Title Patient will tolerate 5 seconds of single leg stance without loss of balance to improve ability to get in and out of shower safely.   Time 12   Period Weeks   Status New   PT LONG TERM GOAL #4   Title Patient will demonstrate an improved Berg Balance Score of > as to demonstrate improved balance with ADLs such as sitting/standing and transfer balance and reduced fall risk   Time 12   Period Weeks   Status New   PT LONG TERM GOAL #5   Title  Patient will increase six minute walk test distance to >1000 for progression to community ambulator and improve gait ability   Time 12   Period Weeks   Status New               Plan - 03/04/15 1610    Clinical Impression Statement PT provided min - moderate verbal instruction to improve set up, proper use of UE, and improved posture and gait mechanics. Patient responded moderately to instruction   Pt will benefit from skilled therapeutic intervention in order to improve on the following deficits Abnormal gait;Decreased balance;Decreased endurance;Decreased mobility;Difficulty walking;Dizziness;Decreased activity tolerance;Decreased strength;Pain   Rehab Potential Good   PT Frequency 2x / week   PT Duration 12 weeks   PT Treatment/Interventions Manual techniques;Balance training;Neuromuscular re-education;Therapeutic exercise;Therapeutic activities;Gait training;Stair training;Cryotherapy;Canalith  Repostioning;Electrical Stimulation   PT Next Visit Plan Elys maneuver, balance training, strengthening   PT Home Exercise Plan 4 way hip, sit to stand   Consulted and Agree with Plan of Care Patient        Problem List There are no active problems to display for this patient.   Ezekiel Ina 03/04/2015, 8:53 AM  [name] MAIN [facility] SERVICES [address] [place], [place], [address] Phone: [phone]   Fax:  [phone]  Name: Kurt [name] MRN: [id number] Date of Birth: October 02, 1940

## 2015-03-09 ENCOUNTER — Ambulatory Visit: Payer: Medicare PPO | Admitting: Physical Therapy

## 2015-03-09 ENCOUNTER — Encounter: Payer: Self-pay | Admitting: Physical Therapy

## 2015-03-09 DIAGNOSIS — R262 Difficulty in walking, not elsewhere classified: Secondary | ICD-10-CM

## 2015-03-09 DIAGNOSIS — R531 Weakness: Secondary | ICD-10-CM | POA: Diagnosis not present

## 2015-03-09 DIAGNOSIS — R2681 Unsteadiness on feet: Secondary | ICD-10-CM

## 2015-03-09 NOTE — Therapy (Signed)
[place] [facility] MAIN [facility] SERVICES [address] [place], [place], [address] Phone: [phone]   Fax:  [phone]  Physical Therapy Treatment  Patient Details  Name: Kurt [name] MRN: [id number] Date of Birth: [birth year] Referring Provider: shah  Encounter Date: 03/09/2015      PT End of Session - 03/09/15 0838    Visit Number 6   Number of Visits 25   Date for PT Re-Evaluation 05/12/14   PT Start Time 0830   PT Stop Time 0910   PT Time Calculation (min) 40 min      History reviewed. No pertinent past medical history.  History reviewed. No pertinent past surgical history.  There were no vitals filed for this visit.  Visit Diagnosis:  Weakness  Unsteady gait  Difficulty walking      Subjective Assessment - 03/09/15 0837    Subjective Patient is having pain today in both legs 4/10.   Patient is accompained by: Family member   Pain Score 4    Pain Location Leg   Pain Orientation Right;Left   Pain Descriptors / Indicators Aching   Pain Onset More than a month ago      Neuromuscular training: Blue foam with head turns left and right, catching a ball, tandem stand on blue foam and modified tandem on blue foam, cone reaching on foam 1/2 foam with static stand,  Walking on blue foam balance beam, side stepping on blue foam balance , balloon tapping on blue foam 4 square side stepping, fwd/bwd stepping, diagonal stepping left and diagonal stepping right CGA and Min to mod verbal cues used throughout with increased in postural sway and LOB most seen with narrow base of support and while on uneven surfaces.                           PT Education - 03/09/15 [id number]    Education provided Yes   Education Details HEP   Person([name]) Educated Patient   Methods Explanation   Comprehension Verbalized understanding             PT Long Term Goals - 02/18/15 1006    PT LONG TERM GOAL #1   Title Patient will  increase BLE gross strength to 4+/5 as to improve functional strength for independent gait, increased standing tolerance and increased ADL ability   Time 12   Period Weeks   Status New   PT LONG TERM GOAL #2   Title Patient will report a worst pain of 3/10 on VAS in right hip            to improve tolerance with ADLs and reduced symptoms with activities.    Time 12   Status New   PT LONG TERM GOAL #3   Title Patient will tolerate 5 seconds of single leg stance without loss of balance to improve ability to get in and out of shower safely.   Time 12   Period Weeks   Status New   PT LONG TERM GOAL #4   Title Patient will demonstrate an improved Berg Balance Score of > as to demonstrate improved balance with ADLs such as sitting/standing and transfer balance and reduced fall risk   Time 12   Period Weeks   Status New   PT LONG TERM GOAL #5   Title Patient will increase six minute walk test distance to >1000 for progression to community ambulator and improve gait ability  Time 12   Period Weeks   Status New               Plan - 03/09/15 [id number]    Clinical Impression Statement Patient performs intermediate level exercises without pain behaviors and needs verbal cuing for postural alignment and head positioning Tactile cues and assistance needed to keep lower leg and knee in neutral to avoid compensations with ankle motion   Pt will benefit from skilled therapeutic intervention in order to improve on the following deficits Abnormal gait;Decreased balance;Decreased endurance;Decreased mobility;Difficulty walking;Dizziness;Decreased activity tolerance;Decreased strength;Pain   Rehab Potential Good   PT Frequency 2x / week   PT Duration 12 weeks   PT Treatment/Interventions Manual techniques;Balance training;Neuromuscular re-education;Therapeutic exercise;Therapeutic activities;Gait training;Stair training;Cryotherapy;Canalith Repostioning;Electrical Stimulation   PT Next Visit Plan Elys  maneuver, balance training, strengthening   PT Home Exercise Plan 4 way hip, sit to stand   Consulted and Agree with Plan of Care Patient        Problem List There are no active problems to display for this patient.   Kurt [name], Kurt [name] 03/09/2015, 8:40 AM  [place] [facility] [address] [place], [place], [address] Phone: [phone]   Fax:  [phone]  Name: Kurt [name] MRN: [id number] Date of Birth: [birth year]

## 2015-03-11 ENCOUNTER — Encounter: Payer: Self-pay | Admitting: Physical Therapy

## 2015-03-11 ENCOUNTER — Ambulatory Visit: Payer: Medicare PPO | Admitting: Physical Therapy

## 2015-03-11 DIAGNOSIS — R2681 Unsteadiness on feet: Secondary | ICD-10-CM

## 2015-03-11 DIAGNOSIS — R531 Weakness: Secondary | ICD-10-CM | POA: Diagnosis not present

## 2015-03-11 DIAGNOSIS — R262 Difficulty in walking, not elsewhere classified: Secondary | ICD-10-CM

## 2015-03-11 NOTE — Therapy (Addendum)
[place] [facility] MAIN [facility] SERVICES [address] [place], [place], [address] Phone: [phone]   Fax:  [phone]  Physical Therapy Treatment  Patient Details  Name: Kurt [name] MRN: [id number] Date of Birth: 1941-04-07 Referring Provider: shah  Encounter Date: 03/11/2015      PT End of Session - 03/11/15 0854    Visit Number 7   Number of Visits 25   Date for PT Re-Evaluation 05/12/14   PT Start Time 0830   PT Stop Time 0910   PT Time Calculation (min) 40 min   Equipment Utilized During Treatment Gait belt   Activity Tolerance Patient limited by lethargy;Patient limited by fatigue;Patient limited by pain   Behavior During Therapy [facility] for tasks assessed/performed      No past medical history on file.  No past surgical history on file.  There were no vitals filed for this visit.  Visit Diagnosis:  Weakness  Unsteady gait  Difficulty walking      Subjective Assessment - 03/11/15 0848    Subjective Patient is having pain today in both legs 8/10. He had to walk in from the parking lot and it bothered his legs.    Patient is accompained by: Family member   Pain Score 8    Pain Location Leg   Pain Onset More than a month ago      standing hip abd with YTB x 20  side stepping left and right in parallel bars 10 feet x 3 standing on blue foam with cone reaching x 20 across midline step ups from floor to 6 inch stool x 20 bilateral sit to stand x 10 marching in parallel bars x 20 stepping pattern with weight shifting fwd/bwd x 10.  Patient needs occasional verbal cueing to improve posture and cueing to correctly perform exercises slowly, holding at end of range to increase motor firing of desired muscle to encourage fatigue.      OUTCOME MEASURES: TEST Outcome Interpretation  5 times sit<>stand 16.97sec >31 yo, >15 sec indicates increased risk for falls  10 meter walk test  . 92 m/s <1.0 m/s indicates  increased risk for falls; limited community ambulator  Timed up and Go  12.15  sec <14 sec indicates increased risk for falls  6 minute walk test  650 Feet 1000 feet is community Forensic scientist Assessment 46/56 <36/56 (100% risk for falls), 37-45 (80% risk for falls); 46-51 (>50% risk for falls); 52-55 (lower risk <25% of falls)                                 PT Education - 03/11/15 0853    Education provided Yes   Education Details safety with balance and transfers   Person(s) Educated Patient   Comprehension Verbalized understanding             PT Long Term Goals - 02/18/15 1006    PT LONG TERM GOAL #1   Title Patient will increase BLE gross strength to 4+/5 as to improve functional strength for independent gait, increased standing tolerance and increased ADL ability   Time 12   Period Weeks   Status New   PT LONG TERM GOAL #2   Title Patient will report a worst pain of 3/10 on VAS in right hip            to improve tolerance with ADLs and reduced symptoms with  activities.    Time 12   Status New   PT LONG TERM GOAL #3   Title Patient will tolerate 5 seconds of single leg stance without loss of balance to improve ability to get in and out of shower safely.   Time 12   Period Weeks   Status New   PT LONG TERM GOAL #4   Title Patient will demonstrate an improved Berg Balance Score of > as to demonstrate improved balance with ADLs such as sitting/standing and transfer balance and reduced fall risk   Time 12   Period Weeks   Status New   PT LONG TERM GOAL #5   Title Patient will increase six minute walk test distance to >1000 for progression to community ambulator and improve gait ability   Time 12   Period Weeks   Status New               Plan - 03/11/15 0854    Clinical Impression Statement Patient is limited by pain today in his legs. He is able to perform standing balance and therapeutic  exericse with cuing.    Pt will benefit from skilled therapeutic intervention in order to improve on the following deficits Abnormal gait;Decreased balance;Decreased endurance;Decreased mobility;Difficulty walking;Dizziness;Decreased activity tolerance;Decreased strength;Pain   Rehab Potential Good   PT Frequency 2x / week   PT Duration 12 weeks   PT Treatment/Interventions Manual techniques;Balance training;Neuromuscular re-education;Therapeutic exercise;Therapeutic activities;Gait training;Stair training;Cryotherapy;Canalith Repostioning;Electrical Stimulation   PT Next Visit Plan Elys maneuver, balance training, strengthening   PT Home Exercise Plan 4 way hip, sit to stand   Consulted and Agree with Plan of Care Patient        Problem List There are no active problems to display for this patient.   Ezekiel InaMansfield, Devine Dant S 03/11/2015, 8:57 AM  [place] [facility] [address] [place], [place], [address] Phone: [phone]   Fax:  [phone]  Name: Kurt [name] MRN: [id number] Date of Birth: 1940/05/05

## 2015-03-12 NOTE — Addendum Note (Signed)
Addended by: Ezekiel InaMANSFIELD, Aleysia Oltmann S on: 03/12/2015 11:36 AM   Modules accepted: Orders

## 2015-03-16 ENCOUNTER — Ambulatory Visit: Payer: Medicare PPO | Admitting: Physical Therapy

## 2015-03-18 ENCOUNTER — Ambulatory Visit: Payer: Medicare PPO | Admitting: Physical Therapy

## 2015-03-23 ENCOUNTER — Ambulatory Visit: Payer: Medicare PPO | Admitting: Physical Therapy

## 2015-03-25 ENCOUNTER — Ambulatory Visit: Payer: Medicare PPO | Admitting: Physical Therapy

## 2015-03-30 ENCOUNTER — Ambulatory Visit: Payer: Medicare PPO | Attending: Neurology | Admitting: Physical Therapy

## 2015-03-30 DIAGNOSIS — R2681 Unsteadiness on feet: Secondary | ICD-10-CM | POA: Diagnosis present

## 2015-03-30 DIAGNOSIS — R262 Difficulty in walking, not elsewhere classified: Secondary | ICD-10-CM | POA: Diagnosis present

## 2015-03-30 DIAGNOSIS — R531 Weakness: Secondary | ICD-10-CM | POA: Diagnosis present

## 2015-03-30 NOTE — Therapy (Signed)
[place] MAIN [facility] SERVICES [address] [place], [place], [address] Phone: [phone]   Fax:  [phone]  Physical Therapy Treatment  Patient Details  Name: Kurt [name] MRN: [id number] Date of Birth: Jul 11, 1940 Referring Provider: shah  Encounter Date: 03/30/2015      PT End of Session - 03/30/15 1031    Visit Number 8   Number of Visits 25   Date for PT Re-Evaluation 05/12/14   Authorization Type 2/10   PT Start Time 0934   PT Stop Time 1020   PT Time Calculation (min) 46 min   Equipment Utilized During Treatment Gait belt   Activity Tolerance Patient tolerated treatment well   Behavior During Therapy [facility] for tasks assessed/performed      No past medical history on file.  No past surgical history on file.  There were no vitals filed for this visit.  Visit Diagnosis:  Weakness  Unsteady gait  Difficulty walking      Subjective Assessment - 03/30/15 0934    Subjective Patient reports he has less hip pain today. Reports no falls at home, states he has not been doing a HEP consistently.    Currently in Pain? Yes   Pain Score 5    Pain Location Hip   Pain Orientation Right   Pain Descriptors / Indicators Aching      Standing heel raises x 20 repetitions  Tandem stance x 10 seconds bilaterally   Tandem walking in // bars x 6 rounds with several instances of lateral loss of balance, cuing to have weight shifted anteriorly onto toes rather than heels.   Retro ambulation x 20' 2 instances of loss of balance secondary to decreased hip abductor strength (lateral loss of balance)  Leg Press 120# x 10 repetitions, for 3 sets   Sit to stand x 3 sets for 5 reps with 5# weight (mild compensations of trunk flexion noted, though no pain noted)   Side stepping with green t-band x 10 steps bilaterally for 3 sets   Blue foam pad balance while shooting foam basketballs with eyes open, closed, and narrow stance x 30" for  each.   Tandem walking on blue foam pad x 4 rounds with 2 finger assist to manage lateral loss of balance.                            PT Education - 03/30/15 1030    Education provided Yes   Education Details Importance of HEP, techniques for maintenance of balance (coming anteriorly)    Person(s) Educated Patient   Methods Explanation;Demonstration;Handout   Comprehension Verbalized understanding;Returned demonstration             PT Long Term Goals - 03/12/15 1114    PT LONG TERM GOAL #1   Title Patient will increase BLE gross strength to 4+/5 as to improve functional strength for independent gait, increased standing tolerance and increased ADL ability   Time 12   Period Weeks   Status Partially Met   PT LONG TERM GOAL #2   Title Patient will report a worst pain of 3/10 on VAS in right hip            to improve tolerance with ADLs and reduced symptoms with activities.    Time 12   Period Weeks   Status Partially Met   PT LONG TERM GOAL #3   Title Patient will tolerate 5 seconds  of single leg stance without loss of balance to improve ability to get in and out of shower safely.   Time 12   Period Weeks   Status Partially Met   PT LONG TERM GOAL #4   Title Patient will demonstrate an improved Berg Balance Score of > as to demonstrate improved balance with ADLs such as sitting/standing and transfer balance and reduced fall risk   Status Partially Met               Plan - 03/30/15 1031    Clinical Impression Statement Patient demonstrates good tolerance for higher level balance and LE strengthening activities in this session. He demonstrates LE weakness, particularly in proximal musculature which is impacting his balance and gait speed. Skilled PT services recommended to increase his LE strength for higher level balance deficits.    Pt will benefit from skilled therapeutic intervention in order to improve on the following deficits Abnormal  gait;Decreased balance;Decreased endurance;Decreased mobility;Difficulty walking;Dizziness;Decreased activity tolerance;Decreased strength;Pain   Rehab Potential Good   PT Frequency 2x / week   PT Duration 12 weeks   PT Treatment/Interventions Manual techniques;Balance training;Neuromuscular re-education;Therapeutic exercise;Therapeutic activities;Gait training;Stair training;Cryotherapy;Canalith Repostioning;Electrical Stimulation   PT Next Visit Plan Progress higher level balance and LE strengthening activities.    PT Home Exercise Plan Side stepping with green band and sit to stand with 5# weight    Consulted and Agree with Plan of Care Patient        Problem List There are no active problems to display for this patient.  Kurt [name], PT, DPT    03/30/2015, 10:39 AM  [place] MAIN [facility] SERVICES [address] [place], [place], [address] Phone: [phone]   Fax:  [phone]  Name: Kurt [name] MRN: [id number] Date of Birth: August 17, 1940

## 2015-04-01 ENCOUNTER — Ambulatory Visit: Payer: Medicare PPO

## 2015-04-01 ENCOUNTER — Ambulatory Visit: Payer: Medicare PPO | Admitting: Physical Therapy

## 2015-04-01 ENCOUNTER — Encounter: Payer: Self-pay | Admitting: Physical Therapy

## 2015-04-01 DIAGNOSIS — R262 Difficulty in walking, not elsewhere classified: Secondary | ICD-10-CM

## 2015-04-01 DIAGNOSIS — R531 Weakness: Secondary | ICD-10-CM

## 2015-04-01 DIAGNOSIS — R2681 Unsteadiness on feet: Secondary | ICD-10-CM

## 2015-04-01 NOTE — Therapy (Signed)
[place] MAIN [facility] SERVICES [address] [place], [place], [address] Phone: [phone]   Fax:  [phone]  Physical Therapy Treatment  Patient Details  Name: RUEBEN KASSIM MRN: [id number] Date of Birth: 1940-06-12 Referring Provider: shah  Encounter Date: 04/01/2015      PT End of Session - 04/01/15 0953    Visit Number 9   Number of Visits 25   Date for PT Re-Evaluation 05/12/14   Authorization Type 2/10   PT Start Time 0925   PT Stop Time 1005   PT Time Calculation (min) 40 min   Equipment Utilized During Treatment Gait belt   Activity Tolerance Patient tolerated treatment well   Behavior During Therapy [facility] for tasks assessed/performed      History reviewed. No pertinent past medical history.  History reviewed. No pertinent past surgical history.  There were no vitals filed for this visit.  Visit Diagnosis:  Weakness  Unsteady gait  Difficulty walking      Subjective Assessment - 04/01/15 0952    Subjective Patient reports he has 7/10 hip pain today. Reports no falls at home, states he has not been doing a HEP consistently.    Currently in Pain? Yes   Pain Score 7    Pain Location Leg   Pain Orientation Left        Neuromuscular Re-education   Marching in place on blue foam pad x 30 seconds for 2 sets   Tandem standing in // bars  x 1 minute Step ups to blue foam pad x 10 bilaterally for 2 sets bilaterally   Obstacle course with stepping over objects biodex x 5 reps fwd/bwd/side stepping Four square stepping.side to side, front and back and diagonals  Therapeutic exercise; Leg press x 20 x 3 100 lbs, heel raises with 90 lbs x 20 x 3 Heel raises standing x 20 4 way hip RTB x 20 x 2                          PT Education - 04/01/15 0953    Education provided Yes   Education Details HEP progression   Person(s) Educated Patient   Methods Explanation   Comprehension Verbalized  understanding             PT Long Term Goals - 03/12/15 1114    PT LONG TERM GOAL #1   Title Patient will increase BLE gross strength to 4+/5 as to improve functional strength for independent gait, increased standing tolerance and increased ADL ability   Time 12   Period Weeks   Status Partially Met   PT LONG TERM GOAL #2   Title Patient will report a worst pain of 3/10 on VAS in right hip            to improve tolerance with ADLs and reduced symptoms with activities.    Time 12   Period Weeks   Status Partially Met   PT LONG TERM GOAL #3   Title Patient will tolerate 5 seconds of single leg stance without loss of balance to improve ability to get in and out of shower safely.   Time 12   Period Weeks   Status Partially Met   PT LONG TERM GOAL #4   Title Patient will demonstrate an improved Berg Balance Score of > as to demonstrate improved balance with ADLs such as sitting/standing and transfer balance and reduced fall risk  Status Partially Met               Plan - 04/01/15 0954    Clinical Impression Statement Patient has better stepping pattern with less stopping and better posture. He has limited exercise tolerance due to leg pain.    Pt will benefit from skilled therapeutic intervention in order to improve on the following deficits Abnormal gait;Decreased balance;Decreased endurance;Decreased mobility;Difficulty walking;Dizziness;Decreased activity tolerance;Decreased strength;Pain   Rehab Potential Good   PT Frequency 2x / week   PT Duration 12 weeks   PT Treatment/Interventions Manual techniques;Balance training;Neuromuscular re-education;Therapeutic exercise;Therapeutic activities;Gait training;Stair training;Cryotherapy;Canalith Repostioning;Electrical Stimulation   PT Next Visit Plan Progress higher level balance and LE strengthening activities.    PT Home Exercise Plan Side stepping with green band and sit to stand with 5# weight    Consulted and Agree with  Plan of Care Patient        Problem List There are no active problems to display for this patient.   Alanson Puls 04/01/2015, 9:58 AM  [place] MAIN [facility] SERVICES [address] [place], [place], [address] Phone: [phone]   Fax:  [phone]  Name: CHARLI LIBERATORE MRN: [id number] Date of Birth: 1940/11/26

## 2015-04-06 ENCOUNTER — Ambulatory Visit: Payer: Medicare PPO | Admitting: Physical Therapy

## 2015-04-06 ENCOUNTER — Ambulatory Visit: Payer: Medicare PPO

## 2015-04-06 NOTE — Therapy (Signed)
[place] [facility] [address] [place], [place], [address] Phone: [phone]   Fax:  [phone]  Patient Details  Name: Kurt [name] MRN: [id number] Date of Birth: 01-31-1941 Referring Provider:  Lonell FaceShah, Hemang K, MD  Encounter Date: 04/06/2015 Patient arrived too late to have his therapy and he rescheduled his appointment.   Ezekiel InaMansfield, Erick Murin S 04/06/2015, 10:36 AM  [place] [facility] [address] [place], [place], [address] Phone: [phone]   Fax:  [phone]

## 2015-04-08 ENCOUNTER — Ambulatory Visit: Payer: Medicare PPO | Admitting: Physical Therapy

## 2015-04-08 ENCOUNTER — Ambulatory Visit: Payer: Medicare PPO

## 2015-04-08 ENCOUNTER — Encounter: Payer: Self-pay | Admitting: Physical Therapy

## 2015-04-08 DIAGNOSIS — R531 Weakness: Secondary | ICD-10-CM

## 2015-04-08 DIAGNOSIS — R2681 Unsteadiness on feet: Secondary | ICD-10-CM

## 2015-04-08 DIAGNOSIS — R262 Difficulty in walking, not elsewhere classified: Secondary | ICD-10-CM

## 2015-04-08 NOTE — Therapy (Signed)
[place] MAIN [facility] SERVICES [address] [place], [place], [address] Phone: [phone]   Fax:  [phone]  Physical Therapy Treatment  Patient Details  Name: Kurt [name] MRN: [id number] Date of Birth: Aug 17, 1940 Referring Provider: shah  Encounter Date: 04/08/2015      PT End of Session - 04/08/15 0919    Visit Number 10   Number of Visits 25   Date for PT Re-Evaluation 05/12/14   Authorization Type 2/10   PT Start Time 0910   PT Stop Time 0950   PT Time Calculation (min) 40 min   Equipment Utilized During Treatment Gait belt   Activity Tolerance Patient tolerated treatment well   Behavior During Therapy [facility] for tasks assessed/performed      History reviewed. No pertinent past medical history.  History reviewed. No pertinent past surgical history.  There were no vitals filed for this visit.  Visit Diagnosis:  Weakness  Unsteady gait  Difficulty walking      Subjective Assessment - 04/08/15 0918    Subjective Patient reports he has 0/10 hip pain today. Reports no falls at home, states he has not been doing a HEP consistently.    Patient is accompained by: Family member   Currently in Pain? No/denies      Therapeutic exercise; Leg press x 20 x 3 90 lbs, heel raises with 90 lbs x 20 x 3 Heel raises standing  4 way hip RTB x 20 Matrix with 17 lbs fwd/bwd/side stepping left and right x 5 each way to challenge balance  side stepping left and right in parallel bars 10 feet x 3 standing on blue foam with cone reaching x 20 across midline step ups from floor to 6 inch stool x 20 bilateral Patient needs occasional verbal cueing to improve posture and cueing to correctly perform exercises slowly, holding at end of range to increase motor firing of desired muscle to encourage fatigue.                           PT Education - 04/08/15 0919    Education provided Yes   Education Details HEP   Person(s) Educated Patient   Methods Explanation   Comprehension Verbalized understanding             PT Long Term Goals - 03/12/15 1114    PT LONG TERM GOAL #1   Title Patient will increase BLE gross strength to 4+/5 as to improve functional strength for independent gait, increased standing tolerance and increased ADL ability   Time 12   Period Weeks   Status Partially Met   PT LONG TERM GOAL #2   Title Patient will report a worst pain of 3/10 on VAS in right hip            to improve tolerance with ADLs and reduced symptoms with activities.    Time 12   Period Weeks   Status Partially Met   PT LONG TERM GOAL #3   Title Patient will tolerate 5 seconds of single leg stance without loss of balance to improve ability to get in and out of shower safely.   Time 12   Period Weeks   Status Partially Met   PT LONG TERM GOAL #4   Title Patient will demonstrate an improved Berg Balance Score of > as to demonstrate improved balance with ADLs such as sitting/standing and transfer balance and reduced fall risk  Status Partially Met               Plan - 04/08/15 0920    Clinical Impression Statement Min cueing needed to appropriately perform  tasks with leg, hand, and head position. Decreased coordination demonstrated requiring consistent verbal cueing to correct form   Pt will benefit from skilled therapeutic intervention in order to improve on the following deficits Abnormal gait;Decreased balance;Decreased endurance;Decreased mobility;Difficulty walking;Dizziness;Decreased activity tolerance;Decreased strength;Pain   Rehab Potential Good   PT Frequency 2x / week   PT Duration 12 weeks   PT Treatment/Interventions Manual techniques;Balance training;Neuromuscular re-education;Therapeutic exercise;Therapeutic activities;Gait training;Stair training;Cryotherapy;Canalith Repostioning;Electrical Stimulation   PT Next Visit Plan Progress higher level balance and LE strengthening  activities.    PT Home Exercise Plan Side stepping with green band and sit to stand with 5# weight    Consulted and Agree with Plan of Care Patient        Problem List There are no active problems to display for this patient.   Alanson Puls 04/08/2015, 9:21 AM  [place] MAIN [facility] SERVICES [address] [place], [place], [address] Phone: [phone]   Fax:  [phone]  Name: Kurt [name] MRN: [id number] Date of Birth: Mar 04, 1941

## 2015-04-13 ENCOUNTER — Ambulatory Visit: Payer: Medicare PPO

## 2015-04-13 ENCOUNTER — Ambulatory Visit: Payer: Medicare PPO | Admitting: Physical Therapy

## 2015-04-13 DIAGNOSIS — R262 Difficulty in walking, not elsewhere classified: Secondary | ICD-10-CM

## 2015-04-13 DIAGNOSIS — R2681 Unsteadiness on feet: Secondary | ICD-10-CM

## 2015-04-13 DIAGNOSIS — R531 Weakness: Secondary | ICD-10-CM

## 2015-04-13 NOTE — Therapy (Signed)
[place] MAIN [facility] SERVICES [address] [place], [place], [address] Phone: [phone]   Fax:  [phone]  Physical Therapy Treatment/Discharge summary  Patient Details  Name: NKOSI CORTRIGHT MRN: [id number] Date of Birth: 18-Jul-1940 Referring Provider: shah  Encounter Date: 04/13/2015      PT End of Session - 04/13/15 1108    Visit Number 11   Number of Visits 25   Date for PT Re-Evaluation 05/12/14   Authorization Type 2/10   PT Start Time 1100   PT Stop Time 1140   PT Time Calculation (min) 40 min   Equipment Utilized During Treatment Gait belt   Activity Tolerance Patient tolerated treatment well   Behavior During Therapy [facility] for tasks assessed/performed      No past medical history on file.  No past surgical history on file.  There were no vitals filed for this visit.  Visit Diagnosis:  Weakness  Unsteady gait  Difficulty walking      Subjective Assessment - 04/13/15 1107    Subjective Patient reports he has 8/10 hip pain today. Reports no falls at home, states he has not been doing a HEP consistently.    Patient is accompained by: Family member   Currently in Pain? Yes   Pain Score 8        outcome measures: TUG = 11.30 10 MW = . 94 m/sec 5 x sit to stand = 12.01 sec 6  Mw= 800 feet  standing hip abd with YTB x 20  side stepping left and right in parallel bars 10 feet x 3  step ups from floor to 6 inch stool x 20 bilateral  stepping / tapping to stool  x 10 x 2 sets  Sit to stand x 10 with fatigue.CGA and Min  verbal cues used throughout with increased in postural sway and LOB most seen with narrow base of support and while on uneven surfaces. Continues to have balance deficits typical with diagnosis. Patient performs intermediate level exercises without pain behaviors and needs verbal cuing for posture.                            PT Education - 04/13/15 1108    Education  provided Yes   Education Details HEP   Person(s) Educated Patient   Methods Explanation   Comprehension Verbalized understanding             PT Long Term Goals - 03/12/15 1114    PT LONG TERM GOAL #1   Title Patient will increase BLE gross strength to 4+/5 as to improve functional strength for independent gait, increased standing tolerance and increased ADL ability   Time 12   Period Weeks   Status Partially Met   PT LONG TERM GOAL #2   Title Patient will report a worst pain of 3/10 on VAS in right hip            to improve tolerance with ADLs and reduced symptoms with activities.    Time 12   Period Weeks   Status Partially Met   PT LONG TERM GOAL #3   Title Patient will tolerate 5 seconds of single leg stance without loss of balance to improve ability to get in and out of shower safely.   Time 12   Period Weeks   Status Partially Met   PT LONG TERM GOAL #4   Title Patient will demonstrate an improved  Berg Balance Score of > as to demonstrate improved balance with ADLs such as sitting/standing and transfer balance and reduced fall risk   Status Partially Met               Plan - 04/13/15 1109    Clinical Impression Statement Patient has pain today in his calfs and thighs that causes him to ambulate slowly . He performed exercise and balance exercises and HEP for discharge plans today.    Pt will benefit from skilled therapeutic intervention in order to improve on the following deficits Abnormal gait;Decreased balance;Decreased endurance;Decreased mobility;Difficulty walking;Dizziness;Decreased activity tolerance;Decreased strength;Pain   Rehab Potential Good   PT Frequency 2x / week   PT Duration 12 weeks   PT Treatment/Interventions Manual techniques;Balance training;Neuromuscular re-education;Therapeutic exercise;Therapeutic activities;Gait training;Stair training;Cryotherapy;Canalith Repostioning;Electrical Stimulation   PT Next Visit Plan Progress higher level  balance and LE strengthening activities.    PT Home Exercise Plan Side stepping with green band and sit to stand with 5# weight    Consulted and Agree with Plan of Care Patient        Problem List There are no active problems to display for this patient.   Alanson Puls 04/13/2015, 1:05 PM  [place] MAIN [facility] SERVICES [address] [place], [place], [address] Phone: [phone]   Fax:  [phone]  Name: JAIME GRIZZELL MRN: [id number] Date of Birth: 1940/05/15

## 2015-04-15 ENCOUNTER — Ambulatory Visit: Payer: Medicare PPO | Admitting: Physical Therapy

## 2015-04-15 ENCOUNTER — Ambulatory Visit: Payer: Medicare PPO

## 2016-03-29 ENCOUNTER — Encounter (INDEPENDENT_AMBULATORY_CARE_PROVIDER_SITE_OTHER): Payer: Medicare PPO

## 2016-03-29 ENCOUNTER — Ambulatory Visit (INDEPENDENT_AMBULATORY_CARE_PROVIDER_SITE_OTHER): Payer: Self-pay | Admitting: Vascular Surgery

## 2016-03-29 ENCOUNTER — Other Ambulatory Visit (INDEPENDENT_AMBULATORY_CARE_PROVIDER_SITE_OTHER): Payer: Self-pay

## 2016-05-06 ENCOUNTER — Other Ambulatory Visit (INDEPENDENT_AMBULATORY_CARE_PROVIDER_SITE_OTHER): Payer: Self-pay | Admitting: Vascular Surgery

## 2016-05-06 ENCOUNTER — Encounter (INDEPENDENT_AMBULATORY_CARE_PROVIDER_SITE_OTHER): Payer: Self-pay | Admitting: Vascular Surgery

## 2016-05-06 ENCOUNTER — Ambulatory Visit (INDEPENDENT_AMBULATORY_CARE_PROVIDER_SITE_OTHER): Payer: Medicare Other

## 2016-05-06 ENCOUNTER — Ambulatory Visit (INDEPENDENT_AMBULATORY_CARE_PROVIDER_SITE_OTHER): Payer: Medicare Other | Admitting: Vascular Surgery

## 2016-05-06 VITALS — BP 147/86 | HR 86 | Resp 16 | Ht 74.0 in | Wt 160.0 lb

## 2016-05-06 DIAGNOSIS — Z48812 Encounter for surgical aftercare following surgery on the circulatory system: Secondary | ICD-10-CM

## 2016-05-06 DIAGNOSIS — I70213 Atherosclerosis of native arteries of extremities with intermittent claudication, bilateral legs: Secondary | ICD-10-CM | POA: Diagnosis not present

## 2016-05-06 DIAGNOSIS — I745 Embolism and thrombosis of iliac artery: Secondary | ICD-10-CM | POA: Diagnosis not present

## 2016-05-06 DIAGNOSIS — I70219 Atherosclerosis of native arteries of extremities with intermittent claudication, unspecified extremity: Secondary | ICD-10-CM | POA: Insufficient documentation

## 2016-05-06 DIAGNOSIS — I714 Abdominal aortic aneurysm, without rupture, unspecified: Secondary | ICD-10-CM | POA: Insufficient documentation

## 2016-05-06 DIAGNOSIS — E119 Type 2 diabetes mellitus without complications: Secondary | ICD-10-CM | POA: Insufficient documentation

## 2016-05-06 DIAGNOSIS — F17210 Nicotine dependence, cigarettes, uncomplicated: Secondary | ICD-10-CM | POA: Diagnosis not present

## 2016-05-06 DIAGNOSIS — F17200 Nicotine dependence, unspecified, uncomplicated: Secondary | ICD-10-CM | POA: Insufficient documentation

## 2016-05-06 DIAGNOSIS — E118 Type 2 diabetes mellitus with unspecified complications: Secondary | ICD-10-CM

## 2016-05-06 NOTE — Progress Notes (Signed)
MRN : [id number]  Kurt [name] is a 76 y.o. (1940/06/26) male who presents with chief complaint of  Chief Complaint  Patient presents with  . Re-evaluation    Ultrasound follow up  .  History of Present Illness: Patient returns today in follow up of PAD with claudication and abdominal aortic aneurysm. 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 He has no aneurysm related symptoms today, but he continues to be bothered by short distance claudication symptoms. He reports no ulceration or infection. He remains on aspirin and Plavix. His noninvasive studies today show a decrease in the size of the aorta now down to 4.0 cm AP by 4.7 cm transverse. The stent graft is patent with no endoleak. 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 His ABIs today are stable at 0.75 on the right and 0.69 on the left with reasonably good digital pressures and waveforms bilaterally.  Current Outpatient Prescriptions  Medication Sig Dispense Refill  . aspirin EC 81 MG tablet Take by mouth.    . clopidogrel (PLAVIX) 75 MG tablet Take by mouth.    . gabapentin (NEURONTIN) 300 MG capsule   1  . glimepiride (AMARYL) 2 MG tablet TK 1 T PO QD  12  . metFORMIN (GLUCOPHAGE) 1000 MG tablet TK 1 T PO  BID  3  . traMADol (ULTRAM) 50 MG tablet TK 1 T PO Q 6 H PRN  3   No current facility-administered medications for this visit.     Past Medical History:  Diagnosis Date  . 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 Smoking status: Heavy Tobacco Smoker  . Smokeless tobacco: Current User  . Alcohol use Not on file  Married, wife accompanies him today  Family History No  bleeding or clotting disorders  No Known Allergies   REVIEW OF SYSTEMS (Negative unless checked)  Constitutional: [] Weight loss  [] Fever  [] Chills Cardiac: [] Chest pain   [] Chest pressure   [] Palpitations   [] Shortness of breath when laying flat   [] Shortness of breath at rest   [] Shortness of breath with exertion. Vascular:  [x] Pain in legs with walking   [] Pain in legs at rest   [] Pain in legs when laying flat   [x] Claudication   [] Pain in feet when walking  [] Pain in feet at rest  [] Pain in feet when laying flat   [] History of DVT   [] Phlebitis   [] Swelling in legs   [] Varicose veins   [] Non-healing ulcers Pulmonary:   [] Uses home oxygen   [] Productive cough   [] Hemoptysis   [] Wheeze  [] COPD   [] Asthma Neurologic:  [] Dizziness  [] Blackouts   [] Seizures   [] History of stroke   [] History of TIA  [] Aphasia   [] Temporary blindness   [] Dysphagia   [] Weakness or numbness in arms   [] Weakness or numbness in legs Musculoskeletal:  [] Arthritis   [] Joint swelling   [] Joint pain   [] Low back pain Hematologic:  [] Easy bruising  [] Easy bleeding   [] Hypercoagulable state   [] Anemic   Gastrointestinal:  [] Blood in stool   [] Vomiting blood  [] Gastroesophageal reflux/heartburn   [] Abdominal pain Genitourinary:  [] Chronic kidney disease   [] Difficult urination  [] Frequent urination  [] Burning with  urination   [] Hematuria Skin:  [] Rashes   [] Ulcers   [] Wounds Psychological:  [] History of anxiety   []  History of major depression.  Physical Examination  BP (!) 147/86 (BP Location: Right Arm)   Pulse 86   Resp 16   Ht 6\' 2"  (1.88 m)   Wt 160 lb (72.6 kg)   BMI 20.54 kg/m  Gen:  WD/WN, NAD Head: [place]/AT, No temporalis wasting. Ear/Nose/Throat: Hearing grossly intact, nares w/o erythema or drainage, trachea midline Eyes: Conjunctiva clear. Sclera non-icteric Neck: Supple.  No JVD.  Pulmonary:  Good air movement, no use of accessory muscles.  Cardiac: RRR, normal S1, S2 Vascular:  Vessel Right Left    Radial Palpable Palpable  Ulnar Palpable Palpable  Brachial Palpable Palpable  Carotid Palpable, without bruit Palpable, without bruit  Aorta Not palpable N/A  Femoral Palpable Palpable  Popliteal 1+ Palpable 1+ Palpable  PT 1+ Palpable Trace Palpable  DP 1+ Palpable 1+ Palpable   Gastrointestinal: soft, non-tender/non-distended. No guarding/reflex.  Musculoskeletal: M/S 5/5 throughout.  No deformity or atrophy. No edema. Neurologic: Sensation grossly intact in extremities.  Symmetrical.  Speech is fluent.  Psychiatric: Judgment intact, Mood & affect appropriate for pt's clinical situation. Dermatologic: No rashes or ulcers noted.  No cellulitis or open wounds. Lymph : No Cervical, Axillary, or Inguinal lymphadenopathy.      Labs No results found for this or any previous visit (from the past 2160 hour(s)).  Radiology No results found.    Assessment/Plan  Tobacco dependence We had a discussion for approximately 3 minutes regarding the absolute need for smoking cessation due to the deleterious nature of tobacco on the vascular system. We discussed the tobacco use would diminish patency of any intervention, and likely significantly worsen progressio of disease. We discussed multiple agents for quitting including replacement therapy or medications to reduce cravings such as Chantix. The patient voices their understanding of the importance of smoking cessation but really has no interest in quitting today.   Diabetes (HCC) blood glucose control important in reducing the progression of atherosclerotic disease. Also, involved in wound healing. On appropriate medications.   AAA (abdominal aortic aneurysm) without rupture (HCC)  His noninvasive studies today show a decrease in the size of the aorta now down to 4.0 cm AP by 4.7 cm transverse. The stent graft is patent with no endoleak. 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 The femoral to femoral bypass is patent. This  is doing well. He is now 2 years status post repair. He still has claudication issues with iliac disease and we will plan on repeat imaging in 6 months.  Atherosclerosis of native arteries of extremity with intermittent claudication (HCC)  His noninvasive studies today show a decrease in the size of the aorta now down to 4.0 cm AP by 4.7 cm transverse. The stent graft is patent with no endoleak. 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 The femoral to femoral bypass is patent His ABIs today are stable at 0.75 on the right and 0.69 on the left with reasonably good digital pressures and waveforms bilaterally. He is still bothered by lifestyle limiting claudication but is not going to stop smoking. With the femoral-femoral bypass and place it is unlikely there will be much that we would be able to do from an endovascular standpoint, and his symptoms certainly wouldn't warn to an open surgical bypass in either leg. We are going to add Zontivity to his regimen as it has  been shown to improve claudication symptoms with minimal increase in bleeding risk.  Plan to return in 6 months with noninvasive studies    [name], MD  05/06/2016 11:17 AM    This note was created with Dragon medical transcription system.  Any errors from dictation are purely unintentional

## 2016-05-06 NOTE — Assessment & Plan Note (Signed)
His noninvasive studies today show a decrease in the size of the aorta now down to 4.0 cm AP by 4.7 cm transverse. The stent graft is patent with no endoleak. 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 The femoral to femoral bypass is patent. This is doing well. He is now 2 years status post repair. He still has claudication issues with iliac disease and we will plan on repeat imaging in 6 months.

## 2016-05-06 NOTE — Assessment & Plan Note (Signed)
His noninvasive studies today show a decrease in the size of the aorta now down to 4.0 cm AP by 4.7 cm transverse. The stent graft is patent with no endoleak. 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 The femoral to femoral bypass is patent His ABIs today are stable at 0.75 on the right and 0.69 on the left with reasonably good digital pressures and waveforms bilaterally. He is still bothered by lifestyle limiting claudication but is not going to stop smoking. With the femoral-femoral bypass and place it is unlikely there will be much that we would be able to do from an endovascular standpoint, and his symptoms certainly wouldn't warn to an open surgical bypass in either leg. We are going to add Zontivity to his regimen as it has been shown to improve claudication symptoms with minimal increase in bleeding risk.  Plan to return in 6 months with noninvasive studies

## 2016-05-06 NOTE — Assessment & Plan Note (Signed)
We had a discussion for approximately 3 minutes regarding the absolute need for smoking cessation due to the deleterious nature of tobacco on the vascular system. We discussed the tobacco use would diminish patency of any intervention, and likely significantly worsen progressio of disease. We discussed multiple agents for quitting including replacement therapy or medications to reduce cravings such as Chantix. The patient voices their understanding of the importance of smoking cessation but really has no interest in quitting today.

## 2016-05-06 NOTE — Assessment & Plan Note (Signed)
blood glucose control important in reducing the progression of atherosclerotic disease. Also, involved in wound healing. On appropriate medications.  

## 2016-07-06 DIAGNOSIS — G608 Other hereditary and idiopathic neuropathies: Secondary | ICD-10-CM | POA: Insufficient documentation

## 2016-07-06 DIAGNOSIS — R2689 Other abnormalities of gait and mobility: Secondary | ICD-10-CM | POA: Insufficient documentation

## 2016-07-06 DIAGNOSIS — R259 Unspecified abnormal involuntary movements: Secondary | ICD-10-CM

## 2016-07-06 DIAGNOSIS — G629 Polyneuropathy, unspecified: Secondary | ICD-10-CM

## 2016-07-06 DIAGNOSIS — G252 Other specified forms of tremor: Secondary | ICD-10-CM | POA: Insufficient documentation

## 2016-11-08 ENCOUNTER — Ambulatory Visit (INDEPENDENT_AMBULATORY_CARE_PROVIDER_SITE_OTHER): Payer: Medicare Other

## 2016-11-08 ENCOUNTER — Encounter (INDEPENDENT_AMBULATORY_CARE_PROVIDER_SITE_OTHER): Payer: Self-pay | Admitting: Vascular Surgery

## 2016-11-08 ENCOUNTER — Ambulatory Visit (INDEPENDENT_AMBULATORY_CARE_PROVIDER_SITE_OTHER): Payer: Medicare Other | Admitting: Vascular Surgery

## 2016-11-08 ENCOUNTER — Other Ambulatory Visit (INDEPENDENT_AMBULATORY_CARE_PROVIDER_SITE_OTHER): Payer: Self-pay | Admitting: Vascular Surgery

## 2016-11-08 VITALS — BP 133/79 | HR 77 | Resp 16 | Ht 74.5 in | Wt 151.0 lb

## 2016-11-08 DIAGNOSIS — I714 Abdominal aortic aneurysm, without rupture, unspecified: Secondary | ICD-10-CM

## 2016-11-08 DIAGNOSIS — I70213 Atherosclerosis of native arteries of extremities with intermittent claudication, bilateral legs: Secondary | ICD-10-CM

## 2016-11-08 DIAGNOSIS — F17200 Nicotine dependence, unspecified, uncomplicated: Secondary | ICD-10-CM | POA: Diagnosis not present

## 2016-11-08 DIAGNOSIS — E118 Type 2 diabetes mellitus with unspecified complications: Secondary | ICD-10-CM | POA: Diagnosis not present

## 2016-11-08 DIAGNOSIS — I745 Embolism and thrombosis of iliac artery: Secondary | ICD-10-CM

## 2016-11-08 NOTE — Patient Instructions (Signed)
Abdominal Aortic Aneurysm Blood pumps away from the heart through tubes (blood vessels) called arteries. Aneurysms are weak or damaged places in the wall of an artery. It bulges out like a balloon. An abdominal aortic aneurysm happens in the main artery of the body (aorta). It can burst or tear, causing bleeding inside the body. This is an emergency. It needs treatment right away. What are the causes? The exact cause is unknown. Things that could cause this problem include:  Fat and other substances building up in the lining of a tube.  Swelling of the walls of a blood vessel.  Certain tissue diseases.  Belly (abdominal) trauma.  An infection in the main artery of the body.  What increases the risk? There are things that make it more likely for you to have an aneurysm. These include:  Being over the age of 74 years old.  Having high blood pressure (hypertension).  Being a male.  Being white.  Being very overweight (obese).  Having a family history of aneurysm.  Using tobacco products.  What are the signs or symptoms? Symptoms depend on the size of the aneurysm and how fast it grows. There may not be symptoms. If symptoms occur, they can include:  Pain (belly, side, lower back, or groin).  Feeling full after eating a small amount of food.  Feeling sick to your stomach (nauseous), throwing up (vomiting), or both.  Feeling a lump in your belly that feels like it is beating (pulsating).  Feeling like you will pass out (faint).  How is this treated?  Medicine to control blood pressure and pain.  Imaging tests to see if the aneurysm gets bigger.  Surgery. How is this prevented? To lessen your chance of getting this condition:  Stop smoking. Stop chewing tobacco.  Limit or avoid alcohol.  Keep your blood pressure, blood sugar, and cholesterol within normal limits.  Eat less salt.  Eat foods low in saturated fats and cholesterol. These are found in animal and  whole dairy products.  Eat more fiber. Fiber is found in whole grains, vegetables, and fruits.  Keep a healthy weight.  Stay active and exercise often.  This information is not intended to replace advice given to you by your health care provider. Make sure you discuss any questions you have with your health care provider. Document Released: 08/06/2012 Document Revised: 09/17/2015 Document Reviewed: 05/11/2012 Elsevier Interactive Patient Education  2017 [employer] Inc[employer]  

## 2016-11-08 NOTE — Assessment & Plan Note (Signed)
His duplex today shows continued shrinkage of his aortic sac now measuring 3.7 x 4.4 cm. His stent graft is patent without endoleak or stenosis present. He is doing well from an aneurysm standpoint. Plan to recheck in 1 year.

## 2016-11-08 NOTE — Assessment & Plan Note (Signed)
blood glucose control important in reducing the progression of atherosclerotic disease. Also, involved in wound healing. On appropriate medications.  

## 2016-11-08 NOTE — Assessment & Plan Note (Signed)
He had a femoral-femoral bypass performed which is also patent without significant stenosis. His ABIs are stable at 0.77 on the right and 0.69 on the left. This is unchanged from his last study. He does still have claudication symptoms but at this point they are really difficult to discern whether or not they are lifestyle limiting due to his overall decline. If his ulceration does not heal, we may have to perform some sort of intervention. As long as this will heal, I would just plan on rechecking his perfusion in 1 year.

## 2016-11-08 NOTE — Progress Notes (Signed)
MRN : [id number]  Kurt [name] is a 76 y.o. (1940/12/28) male who presents with chief complaint of  Chief Complaint  Patient presents with  . Re-evaluation    One year EVAR and ABI  .  History of Present Illness: Patient returns today in follow up of AAA repair and peripheral vascular disease. This was done about 2-1/2 years ago. He has no current aneurysm related symptoms. Specifically, the patient denies new back or abdominal pain, or signs of peripheral embolization. His duplex today shows continued shrinkage of his aortic sac now measuring 3.7 x 4.4 cm. His stent graft is patent without endoleak or stenosis present. He had a femoral-femoral bypass performed which is also patent without significant stenosis. His ABIs are stable at 0.77 on the right and 0.69 on the left. This is unchanged from his last study.  Current Outpatient Prescriptions  Medication Sig Dispense Refill  . aspirin EC 81 MG tablet Take by mouth.    . co-enzyme Q-10 30 MG capsule Take 30 mg by mouth 3 (three) times daily.    Marland Kitchen docusate sodium (COLACE) 100 MG capsule Take 100 mg by mouth 2 (two) times daily.    Marland Kitchen gabapentin (NEURONTIN) 300 MG capsule   1  . glimepiride (AMARYL) 2 MG tablet TK 1 T PO QD  12  . metFORMIN (GLUCOPHAGE) 1000 MG tablet TK 1 T PO  BID  3  . Red Yeast Rice 600 MG CAPS Take by mouth.    . traMADol (ULTRAM) 50 MG tablet TK 1 T PO Q 6 H PRN  3  . clopidogrel (PLAVIX) 75 MG tablet Take by mouth.     No current facility-administered medications for this visit.     Past Medical History:  Diagnosis Date  . Diabetes mellitus without complication (HCC)   PAD AAA   Past Surgical History AAA repair (endo) Fem-fem bypass  Social History Social History  Substance Use Topics  . Smoking status: Heavy Tobacco Smoker  . Smokeless tobacco: Current User  . Alcohol use Not on file    Family History No bleeding or clotting disorders  No Known Allergies   REVIEW OF SYSTEMS (Negative  unless checked)  Constitutional: [] Weight loss  [] Fever  [] Chills Cardiac: [] Chest pain   [] Chest pressure   [] Palpitations   [] Shortness of breath when laying flat   [] Shortness of breath at rest   [] Shortness of breath with exertion. Vascular:  [x] Pain in legs with walking   [] Pain in legs at rest   [] Pain in legs when laying flat   [x] Claudication   [] Pain in feet when walking  [] Pain in feet at rest  [] Pain in feet when laying flat   [] History of DVT   [] Phlebitis   [] Swelling in legs   [] Varicose veins   [] Non-healing ulcers Pulmonary:   [] Uses home oxygen   [] Productive cough   [] Hemoptysis   [] Wheeze  [] COPD   [] Asthma Neurologic:  [] Dizziness  [] Blackouts   [] Seizures   [] History of stroke   [] History of TIA  [] Aphasia   [] Temporary blindness   [] Dysphagia   [] Weakness or numbness in arms   [] Weakness or numbness in legs Musculoskeletal:  [x] Arthritis   [] Joint swelling   [] Joint pain   [] Low back pain Hematologic:  [] Easy bruising  [] Easy bleeding   [] Hypercoagulable state   [] Anemic   Gastrointestinal:  [] Blood in stool   [] Vomiting blood  [] Gastroesophageal reflux/heartburn   [] Abdominal pain Genitourinary:  [] Chronic kidney disease   []   Difficult urination  [] Frequent urination  [] Burning with urination   [] Hematuria Skin:  [] Rashes   [] Ulcers   [] Wounds Psychological:  [] History of anxiety   []  History of major depression.  Physical Examination  BP 133/79 (BP Location: Right Arm)   Pulse 77   Resp 16   Ht 6' 2.5" (1.892 m)   Wt 151 lb (68.5 kg)   BMI 19.13 kg/m  Gen:  WD/WN, NAD. Thin and frail-appearing Head: [place]/AT, No temporalis wasting. Ear/Nose/Throat: Hearing grossly intact, nares w/o erythema or drainage, trachea midline Eyes: Conjunctiva clear. Sclera non-icteric Neck: Supple.  No JVD.  Pulmonary:  Good air movement, no use of accessory muscles.  Cardiac: RRR, normal S1, S2 Vascular:  Vessel Right Left  Radial Palpable Palpable              Aorta Not palpable  N/A      Popliteal Not Palpable Not Palpable  PT 1+ Palpable Trace Palpable  DP Not Palpable 1+ Palpable    Musculoskeletal: M/S 5/5 throughout.  No deformity or atrophy. No edema. Neurologic: Sensation grossly intact in extremities.  Symmetrical.  Speech is fluent.  Psychiatric: Judgment intact, Mood & affect appropriate for pt's clinical situation. Dermatologic: Eschar is present on the left lateral heel area.       Labs No results found for this or any previous visit (from the past 2160 hour(s)).  Radiology No results found.   Assessment/Plan  Diabetes (HCC) blood glucose control important in reducing the progression of atherosclerotic disease. Also, involved in wound healing. On appropriate medications.   Tobacco dependence We discussed this with deleterious to his vascular system and he is very well aware of this. He has no interest in smoking cessation  Atherosclerosis of native arteries of extremity with intermittent claudication (HCC) He had a femoral-femoral bypass performed which is also patent without significant stenosis. His ABIs are stable at 0.77 on the right and 0.69 on the left. This is unchanged from his last study. He does still have claudication symptoms but at this point they are really difficult to discern whether or not they are lifestyle limiting due to his overall decline. If his ulceration does not heal, we may have to perform some sort of intervention. As long as this will heal, I would just plan on rechecking his perfusion in 1 year.  AAA (abdominal aortic aneurysm) without rupture (HCC) His duplex today shows continued shrinkage of his aortic sac now measuring 3.7 x 4.4 cm. His stent graft is patent without endoleak or stenosis present. He is doing well from an aneurysm standpoint. Plan to recheck in 1 year.    Kurt BarrenJason Kamaree Berkel, MD  11/08/2016 10:01 AM    This note was created with Dragon medical transcription system.  Any errors from dictation  are purely unintentional

## 2016-11-08 NOTE — Assessment & Plan Note (Signed)
We discussed this with deleterious to his vascular system and he is very well aware of this. He has no interest in smoking cessation

## 2017-03-02 DIAGNOSIS — E119 Type 2 diabetes mellitus without complications: Secondary | ICD-10-CM | POA: Insufficient documentation

## 2017-03-03 ENCOUNTER — Encounter: Payer: Self-pay | Admitting: Podiatry

## 2017-03-03 ENCOUNTER — Ambulatory Visit: Payer: Medicare Other | Admitting: Podiatry

## 2017-03-03 VITALS — BP 171/90 | HR 79 | Temp 98.7°F | Resp 16

## 2017-03-03 DIAGNOSIS — M79676 Pain in unspecified toe(s): Secondary | ICD-10-CM

## 2017-03-03 DIAGNOSIS — B353 Tinea pedis: Secondary | ICD-10-CM

## 2017-03-03 DIAGNOSIS — L84 Corns and callosities: Secondary | ICD-10-CM | POA: Diagnosis not present

## 2017-03-03 DIAGNOSIS — E0843 Diabetes mellitus due to underlying condition with diabetic autonomic (poly)neuropathy: Secondary | ICD-10-CM | POA: Diagnosis not present

## 2017-03-03 DIAGNOSIS — B351 Tinea unguium: Secondary | ICD-10-CM

## 2017-03-03 MED ORDER — CLOTRIMAZOLE-BETAMETHASONE 1-0.05 % EX CREA: 1.0000 "application " | TOPICAL_CREAM | Freq: Two times a day (BID) | CUTANEOUS | 1 refills | Status: DC

## 2017-03-03 MED ORDER — TERBINAFINE HCL 250 MG PO TABS: 250.0000 mg | ORAL_TABLET | Freq: Every day | ORAL | 0 refills | Status: DC

## 2017-03-03 NOTE — Progress Notes (Signed)
   Subjective:    Patient ID: Kurt [name], male    DOB: 1940/06/20, 76 y.o.   MRN: [id number]  HPI    Review of Systems     Objective:   Physical Exam        Assessment & Plan:

## 2017-03-06 NOTE — Progress Notes (Signed)
   Subjective: Patient with diabetes mellitus presents to the office today for chief complaint of painful callus lesions of the bilateral heels, right great toe and right foot. He also reports scaly, peeling skin to the bilateral heel and possible fungus of the right great toenail. Patient states that the pain is ongoing and is affecting their ability to ambulate without pain. He has used a DM foot cream for the heels and soaked the feet in Epsom salt for treatment. He reports h/o diabetic ulcerations to the right heel. Patient presents today for further treatment and evaluation.   Past Medical History:  Diagnosis Date  . Diabetes mellitus without complication (HCC)      Objective:  Physical Exam General: Alert and oriented x3 in no acute distress  Dermatology: Hyperkeratotic lesion present on the bilateral heels. Pain on palpation with a central nucleated core noted.  Skin is warm, dry and supple bilateral lower extremities. Negative for open lesions or macerations. Pruritus to bilateral heels with hyperkeratosis.   Vascular: Palpable pedal pulses bilaterally. No edema or erythema noted. Capillary refill within normal limits.  Neurological: Epicritic and protective threshold diminished bilaterally.   Musculoskeletal Exam: Pain on palpation at the keratotic lesion noted. Range of motion within normal limits bilateral. Muscle strength 5/5 in all groups bilateral.  Assessment: #1 Diabetes mellitus w/ peripheral neuropathy #2 Pre-ulcerative calluses bilateral feet #3 Tinea pedis bilateral heels #4 h/o diabetic foot ulcer   Plan of Care:  #1 Patient evaluated. #2 Prescription for Lamisil 250 mg #28 given to patient. #3 Prescription for Lotrisone cream given to patient. #4 Appt with Raiford Nobleick for DM shoes. #5 Return to clinic in 4 weeks.    Felecia ShellingBrent M. Evans, DPM [facility]  Dr. Felecia ShellingBrent M. Evans, DPM    [address]                                          [place], [place] [address]                Office [phone]  Fax [phone]

## 2017-03-15 ENCOUNTER — Other Ambulatory Visit: Payer: Medicare Other | Admitting: Orthotics

## 2017-03-31 ENCOUNTER — Ambulatory Visit: Payer: Medicare Other | Admitting: Podiatry

## 2017-03-31 DIAGNOSIS — B353 Tinea pedis: Secondary | ICD-10-CM | POA: Diagnosis not present

## 2017-03-31 DIAGNOSIS — B351 Tinea unguium: Secondary | ICD-10-CM

## 2017-03-31 DIAGNOSIS — L603 Nail dystrophy: Secondary | ICD-10-CM | POA: Diagnosis not present

## 2017-03-31 DIAGNOSIS — M79676 Pain in unspecified toe(s): Secondary | ICD-10-CM

## 2017-03-31 MED ORDER — TERBINAFINE HCL 250 MG PO TABS: 250.0000 mg | ORAL_TABLET | Freq: Every day | ORAL | 0 refills | Status: DC

## 2017-04-01 ENCOUNTER — Other Ambulatory Visit: Payer: Self-pay | Admitting: Podiatry

## 2017-04-03 NOTE — Progress Notes (Signed)
   Subjective: Patient with diabetes mellitus presents to the office today for follow-up evaluation pre-ulcerative calluses to bilateral feet and tinea pedis to bilateral heels.  He states he has been using the Lotrisone and taking the Lamisil as directed with significant improvement of his symptoms.  He reports a new complaint of soreness to the right great toe that began 2-3 weeks ago.  Touching the toe increases the pain.  He denies alleviating factors.  Patient is here for follow-up evaluation and treatment.   Past Medical History:  Diagnosis Date  . Diabetes mellitus without complication (HCC)      Objective:  Physical Exam General: Alert and oriented x3 in no acute distress  Dermatology: Hyperkeratotic, discolored, thickened, onychodystrophy of right great toenail.  Skin is warm, dry and supple bilateral lower extremities. Negative for open lesions or macerations.  Mild pruritus to bilateral heels with hyperkeratosis.   Vascular: Palpable pedal pulses bilaterally. No edema or erythema noted. Capillary refill within normal limits.  Neurological: Epicritic and protective threshold diminished bilaterally.   Musculoskeletal Exam: Pain on palpation at the keratotic lesion noted. Range of motion within normal limits bilateral. Muscle strength 5/5 in all groups bilateral.  Assessment: 1.  Painful dystrophic nail right great toe 2.  Tinea pedis right heel - improved   Plan of Care:  1. Patient evaluated. 2. Discussed treatment alternatives and plan of care. Explained nail avulsion procedure and post procedure course to patient. 3. Patient opted for total temporary nail avulsion of the right great toenail.  4. Prior to procedure, local anesthesia infiltration utilized using 3 ml of a 50:50 mixture of 2% plain lidocaine and 0.5% plain marcaine in a normal hallux block fashion and a betadine prep performed. 5.  Light dressing applied. 6.  Refill prescription for Lotrisone cream provided  to patient. 7.  Continue taking oral Lamisil and until prescription is completed. 8.  Return to clinic as needed.    Felecia ShellingBrent M. Evans, DPM [facility]  Dr. Felecia ShellingBrent M. Evans, DPM    [address]                                        [place], [place] [address]                Office [phone]  Fax [phone]

## 2017-04-19 ENCOUNTER — Encounter: Payer: Medicare Other | Admitting: Orthotics

## 2017-07-16 ENCOUNTER — Emergency Department: Payer: No Typology Code available for payment source

## 2017-07-16 ENCOUNTER — Emergency Department
Admission: EM | Admit: 2017-07-16 | Discharge: 2017-07-16 | Disposition: A | Discharge status: Home / Self Care | Payer: No Typology Code available for payment source | Attending: Student in an Organized Health Care Education/Training Program | Admitting: Student in an Organized Health Care Education/Training Program

## 2017-07-16 DIAGNOSIS — I70219 Atherosclerosis of native arteries of extremities with intermittent claudication, unspecified extremity: Secondary | ICD-10-CM | POA: Diagnosis not present

## 2017-07-16 DIAGNOSIS — Y9389 Activity, other specified: Secondary | ICD-10-CM | POA: Insufficient documentation

## 2017-07-16 DIAGNOSIS — Z79899 Other long term (current) drug therapy: Secondary | ICD-10-CM | POA: Diagnosis not present

## 2017-07-16 DIAGNOSIS — Z7982 Long term (current) use of aspirin: Secondary | ICD-10-CM | POA: Diagnosis not present

## 2017-07-16 DIAGNOSIS — E119 Type 2 diabetes mellitus without complications: Secondary | ICD-10-CM | POA: Diagnosis not present

## 2017-07-16 DIAGNOSIS — R0789 Other chest pain: Secondary | ICD-10-CM

## 2017-07-16 DIAGNOSIS — F17290 Nicotine dependence, other tobacco product, uncomplicated: Secondary | ICD-10-CM | POA: Diagnosis not present

## 2017-07-16 DIAGNOSIS — Y92410 Unspecified street and highway as the place of occurrence of the external cause: Secondary | ICD-10-CM | POA: Insufficient documentation

## 2017-07-16 DIAGNOSIS — S2222XA Fracture of body of sternum, initial encounter for closed fracture: Secondary | ICD-10-CM | POA: Insufficient documentation

## 2017-07-16 DIAGNOSIS — Y999 Unspecified external cause status: Secondary | ICD-10-CM | POA: Insufficient documentation

## 2017-07-16 LAB — BASIC METABOLIC PANEL
Anion gap: 10 (ref 5–15)
BUN: 10 mg/dL (ref 6–20)
CALCIUM: 9.2 mg/dL (ref 8.9–10.3)
CO2: 25 mmol/L (ref 22–32)
Chloride: 104 mmol/L (ref 101–111)
Creatinine, Ser: 1.08 mg/dL (ref 0.61–1.24)
GFR calc Af Amer: >60 mL/min (ref >60)
Glucose, Bld: 191 mg/dL — ABNORMAL HIGH (ref 65–99)
POTASSIUM: 3.9 mmol/L (ref 3.5–5.1)
Sodium: 139 mmol/L (ref 135–145)

## 2017-07-16 LAB — CBC
HEMATOCRIT: 41.6 % (ref 40.0–52.0)
Hemoglobin: 14.6 g/dL (ref 13.0–18.0)
MCH: 33.3 pg (ref 26.0–34.0)
MCHC: 35.2 g/dL (ref 32.0–36.0)
MCV: 94.8 fL (ref 80.0–100.0)
Platelets: 192 10*3/uL (ref 150–440)
RBC: 4.38 MIL/uL — ABNORMAL LOW (ref 4.40–5.90)
RDW: 13.2 % (ref 11.5–14.5)
WBC: 7.7 10*3/uL (ref 3.8–10.6)

## 2017-07-16 LAB — TROPONIN I: Troponin I: <0.03 ng/mL (ref <0.03)

## 2017-07-16 MED ORDER — LIDOCAINE 5 % EX PTCH
1.0000 | MEDICATED_PATCH | CUTANEOUS | Status: DC
  Administered 2017-07-16: 1 via TRANSDERMAL
  Filled 2017-07-16: qty 1

## 2017-07-16 MED ORDER — LIDOCAINE 5 % EX PTCH: 1.0000 | MEDICATED_PATCH | Freq: Two times a day (BID) | CUTANEOUS | 0 refills | Status: DC

## 2017-07-16 MED ORDER — FENTANYL CITRATE (PF) 100 MCG/2ML IJ SOLN: 50.0000 ug | INTRAMUSCULAR | Status: DC | PRN

## 2017-07-16 MED ORDER — TRAMADOL HCL 50 MG PO TABS
50.0000 mg | ORAL_TABLET | Freq: Once | ORAL | Status: AC
  Administered 2017-07-16: 50 mg via ORAL
  Filled 2017-07-16: qty 1

## 2017-07-16 MED ORDER — TRAMADOL HCL 50 MG PO TABS: 50.0000 mg | ORAL_TABLET | Freq: Four times a day (QID) | ORAL | 0 refills | Status: DC | PRN

## 2017-07-16 NOTE — ED Notes (Signed)
ED Provider at bedside. 
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
## 2017-07-16 NOTE — ED Notes (Signed)
Patient transported to X-ray 

## 2017-08-10 ENCOUNTER — Other Ambulatory Visit: Payer: Self-pay | Admitting: Internal Medicine

## 2017-08-10 ENCOUNTER — Ambulatory Visit
Admission: RE | Admit: 2017-08-10 | Discharge: 2017-08-10 | Disposition: A | Discharge status: Home / Self Care | Payer: Medicare Other | Source: Ambulatory Visit | Attending: Internal Medicine | Admitting: Internal Medicine

## 2017-08-10 DIAGNOSIS — R918 Other nonspecific abnormal finding of lung field: Secondary | ICD-10-CM | POA: Diagnosis not present

## 2017-08-10 DIAGNOSIS — R05 Cough: Secondary | ICD-10-CM

## 2017-08-10 DIAGNOSIS — R059 Cough, unspecified: Secondary | ICD-10-CM

## 2017-11-14 ENCOUNTER — Ambulatory Visit (INDEPENDENT_AMBULATORY_CARE_PROVIDER_SITE_OTHER): Payer: Medicare Other

## 2017-11-14 ENCOUNTER — Ambulatory Visit (INDEPENDENT_AMBULATORY_CARE_PROVIDER_SITE_OTHER): Payer: Medicare Other | Admitting: Vascular Surgery

## 2017-11-14 ENCOUNTER — Encounter (INDEPENDENT_AMBULATORY_CARE_PROVIDER_SITE_OTHER): Payer: Self-pay | Admitting: Vascular Surgery

## 2017-11-14 VITALS — BP 151/84 | HR 70 | Resp 16 | Ht 75.0 in | Wt 156.6 lb

## 2017-11-14 DIAGNOSIS — F17200 Nicotine dependence, unspecified, uncomplicated: Secondary | ICD-10-CM

## 2017-11-14 DIAGNOSIS — I714 Abdominal aortic aneurysm, without rupture, unspecified: Secondary | ICD-10-CM

## 2017-11-14 DIAGNOSIS — E119 Type 2 diabetes mellitus without complications: Secondary | ICD-10-CM | POA: Diagnosis not present

## 2017-11-14 DIAGNOSIS — I70213 Atherosclerosis of native arteries of extremities with intermittent claudication, bilateral legs: Secondary | ICD-10-CM | POA: Diagnosis not present

## 2017-11-14 DIAGNOSIS — I6529 Occlusion and stenosis of unspecified carotid artery: Secondary | ICD-10-CM

## 2017-11-14 NOTE — Progress Notes (Signed)
MRN : [id number]  Kurt [name] is a 77 y.o. (May 11, 1940) male who presents with chief complaint of  Chief Complaint  Patient presents with  . Follow-up    60yr abi,evar  .  History of Present Illness: Patient returns today in follow up of multiple vascular issues.  He is about 3 to 4 years status post endovascular aneurysm repair and treatment for PAD with an aorta uni-iliac bypass and left to right femoral to femoral bypass.  He continues to have claudication symptoms which he says are stable but his wife says are a little worse.  His right ABI has dropped to 0.54 and his left ABI is stable at 0.68.  His aneurysm has continued to shrink and is now down to just below 3 cm in maximal diameter. He is also complaining of more left neck pain.  He had his carotids checked several years ago with subcritical disease found per the wife, but these have not been checked in some time.  Current Outpatient Medications  Medication Sig Dispense Refill  . aspirin EC 81 MG tablet Take by mouth.    . Cholecalciferol (D-5000) 5000 units TABS Take by mouth.    . co-enzyme Q-10 30 MG capsule Take 30 mg by mouth 3 (three) times daily.    Marland Kitchen docusate sodium (COLACE) 100 MG capsule Take 100 mg by mouth 2 (two) times daily.    Marland Kitchen donepezil (ARICEPT) 5 MG tablet TK 1 T PO NIGHTLY  1  . FLUAD 0.5 ML SUSY ADM 0.5ML IM UTD  0  . gabapentin (NEURONTIN) 300 MG capsule   1  . glimepiride (AMARYL) 2 MG tablet TK 1 T PO QD  12  . metFORMIN (GLUCOPHAGE) 1000 MG tablet TK 1 T PO  BID  3  . Misc Natural Products (PROSTATE THERAPY COMPLEX PO) Take 562 mg by mouth.    . Red Yeast Rice 600 MG CAPS Take by mouth.    . terbinafine (LAMISIL) 250 MG tablet Take 1 tablet (250 mg total) by mouth daily. 28 tablet 0  . traMADol (ULTRAM) 50 MG tablet TK 1 T PO Q 6 H PRN  3  . vitamin B-12 (CYANOCOBALAMIN) 1000 MCG tablet Take by mouth.    . clopidogrel (PLAVIX) 75 MG tablet Take by mouth.    . clotrimazole-betamethasone (LOTRISONE)  cream APPLY EXTERNALLY TO THE AFFECTED AREA TWICE DAILY (Patient not taking: Reported on 11/14/2017) 45 g 1  . Coconut Oil 1000 MG CAPS Take by mouth.    . Coenzyme Q10 10 MG capsule Take by mouth.    . donepezil (ARICEPT) 5 MG tablet Take by mouth.    Marland Kitchen GARLIC OIL PO Take by mouth.    . lidocaine (LIDODERM) 5 % Place 1 patch onto the skin every 12 (twelve) hours. Remove & Discard patch within 12 hours or as directed by MD (Patient not taking: Reported on 11/14/2017) 10 patch 0  . traMADol (ULTRAM) 50 MG tablet Take 1 tablet (50 mg total) by mouth every 6 (six) hours as needed. (Patient not taking: Reported on 11/14/2017) 10 tablet 0   No current facility-administered medications for this visit.    Past Medical History:  Diagnosis Date  . Diabetes mellitus without complication (HCC)   PAD AAA   Past Surgical History AAA repair (endo) Fem-fem bypass  Social History     Social History  Substance Use Topics  . Smoking status: Heavy Tobacco Smoker  . Smokeless tobacco: Current User  .  Alcohol use Not on file    Family History No bleeding or clotting disorders  No Known Allergies   REVIEW OF SYSTEMS (Negative unless checked)  Constitutional: [] Weight loss  [] Fever  [] Chills Cardiac: [] Chest pain   [] Chest pressure   [] Palpitations   [] Shortness of breath when laying flat   [] Shortness of breath at rest   [] Shortness of breath with exertion. Vascular:  [x] Pain in legs with walking   [] Pain in legs at rest   [] Pain in legs when laying flat   [x] Claudication   [] Pain in feet when walking  [] Pain in feet at rest  [] Pain in feet when laying flat   [] History of DVT   [] Phlebitis   [] Swelling in legs   [] Varicose veins   [] Non-healing ulcers Pulmonary:   [] Uses home oxygen   [] Productive cough   [] Hemoptysis   [] Wheeze  [] COPD   [] Asthma Neurologic:  [] Dizziness  [] Blackouts   [] Seizures   [] History of stroke   [] History of TIA  [] Aphasia   [] Temporary blindness   [] Dysphagia    [] Weakness or numbness in arms   [] Weakness or numbness in legs Musculoskeletal:  [x] Arthritis   [] Joint swelling   [] Joint pain   [] Low back pain Hematologic:  [] Easy bruising  [] Easy bleeding   [] Hypercoagulable state   [] Anemic   Gastrointestinal:  [] Blood in stool   [] Vomiting blood  [] Gastroesophageal reflux/heartburn   [] Abdominal pain Genitourinary:  [] Chronic kidney disease   [] Difficult urination  [] Frequent urination  [] Burning with urination   [] Hematuria Skin:  [] Rashes   [] Ulcers   [] Wounds Psychological:  [] History of anxiety   []  History of major depression.    Physical Examination  BP (!) 151/84 (BP Location: Right Arm)   Pulse 70   Resp 16   Ht 6\' 3"  (1.905 m)   Wt 156 lb 9.6 oz (71 kg)   BMI 19.57 kg/m  Gen: Thin, NAD Head: [place]/AT, No temporalis wasting. Ear/Nose/Throat: Hearing grossly intact, nares w/o erythema or drainage Eyes: Conjunctiva clear. Sclera non-icteric Neck: Supple.  Trachea midline Pulmonary:  Good air movement, no use of accessory muscles.  Cardiac: RRR, no JVD Vascular:  Vessel Right Left  Radial Palpable Palpable                          PT  not palpable  1+ palpable  DP  1+ palpable  1+ palpable   Gastrointestinal: soft, non-tender/non-distended.  Aorta not palpable Musculoskeletal: M/S 5/5 throughout.  No deformity or atrophy.  Trace lower extremity edema. Neurologic: Sensation grossly intact in extremities.  Symmetrical.  Speech is fluent.  Psychiatric: Judgment intact, Mood & affect appropriate for pt's clinical situation. Dermatologic: Several small scabs on the right calf but no open ulcerations       Labs No results found for this or any previous visit (from the past 2160 hour(s)).  Radiology No results found.  Assessment/Plan Diabetes (HCC) blood glucose control important in reducing the progression of atherosclerotic disease. Also, involved in wound healing. On appropriate medications.   Tobacco dependence We  discussed this with deleterious to his vascular system and he is very well aware of this. He has no interest in smoking cessation   Atherosclerosis of native arteries of extremity with intermittent claudication (HCC) Some drop in his ABI on the right and stable on the left.  No limb threatening symptoms.  Patient does not desire any intervention.  Recheck in 1 year  Carotid stenosis  Per the wife.  This has not been checked in many years.  I have offered this to be done at their convenience, but he prefers to have it done when he comes back in 1 year.  Abdominal aortic aneurysm without rupture (HCC) Status post repair about 3 to 4 years ago.  Now measuring under 3 cm in maximal diameter with a patent stent graft and patent femoral to femoral bypass.  Recheck in 1 year    [name], MD  11/14/2017 11:02 AM    This note was created with Dragon medical transcription system.  Any errors from dictation are purely unintentional

## 2017-11-14 NOTE — Assessment & Plan Note (Signed)
Per the wife.  This has not been checked in many years.  I have offered this to be done at their convenience, but he prefers to have it done when he comes back in 1 year.

## 2017-11-14 NOTE — Assessment & Plan Note (Signed)
Some drop in his ABI on the right and stable on the left.  No limb threatening symptoms.  Patient does not desire any intervention.  Recheck in 1 year

## 2017-11-14 NOTE — Assessment & Plan Note (Signed)
Status post repair about 3 to 4 years ago.  Now measuring under 3 cm in maximal diameter with a patent stent graft and patent femoral to femoral bypass.  Recheck in 1 year

## 2018-02-08 ENCOUNTER — Other Ambulatory Visit: Payer: Self-pay | Admitting: Neurology

## 2018-02-08 DIAGNOSIS — R531 Weakness: Secondary | ICD-10-CM

## 2018-02-17 ENCOUNTER — Other Ambulatory Visit: Payer: Self-pay

## 2018-02-17 ENCOUNTER — Emergency Department: Payer: Medicare Other

## 2018-02-17 ENCOUNTER — Encounter
Admission: EM | Disposition: A | Discharge status: Skilled Nursing Facility | Payer: Self-pay | Source: Home / Self Care | Attending: Internal Medicine

## 2018-02-17 ENCOUNTER — Encounter: Payer: Self-pay | Admitting: Emergency Medicine

## 2018-02-17 ENCOUNTER — Inpatient Hospital Stay
Admission: EM | Admit: 2018-02-17 | Discharge: 2018-02-23 | DRG: 253 | Disposition: A | Discharge status: Skilled Nursing Facility | Payer: Medicare Other | Attending: Internal Medicine | Admitting: Internal Medicine

## 2018-02-17 ENCOUNTER — Inpatient Hospital Stay: Payer: Medicare Other | Admitting: Certified Registered Nurse Anesthetist

## 2018-02-17 DIAGNOSIS — Z7902 Long term (current) use of antithrombotics/antiplatelets: Secondary | ICD-10-CM | POA: Diagnosis not present

## 2018-02-17 DIAGNOSIS — I441 Atrioventricular block, second degree: Secondary | ICD-10-CM | POA: Diagnosis present

## 2018-02-17 DIAGNOSIS — J449 Chronic obstructive pulmonary disease, unspecified: Secondary | ICD-10-CM | POA: Diagnosis present

## 2018-02-17 DIAGNOSIS — I4510 Unspecified right bundle-branch block: Secondary | ICD-10-CM | POA: Diagnosis present

## 2018-02-17 DIAGNOSIS — I472 Ventricular tachycardia: Secondary | ICD-10-CM | POA: Diagnosis not present

## 2018-02-17 DIAGNOSIS — Z79899 Other long term (current) drug therapy: Secondary | ICD-10-CM | POA: Diagnosis not present

## 2018-02-17 DIAGNOSIS — Z23 Encounter for immunization: Secondary | ICD-10-CM | POA: Diagnosis not present

## 2018-02-17 DIAGNOSIS — R231 Pallor: Secondary | ICD-10-CM | POA: Diagnosis not present

## 2018-02-17 DIAGNOSIS — E1151 Type 2 diabetes mellitus with diabetic peripheral angiopathy without gangrene: Secondary | ICD-10-CM | POA: Diagnosis present

## 2018-02-17 DIAGNOSIS — I9789 Other postprocedural complications and disorders of the circulatory system, not elsewhere classified: Secondary | ICD-10-CM | POA: Diagnosis present

## 2018-02-17 DIAGNOSIS — B359 Dermatophytosis, unspecified: Secondary | ICD-10-CM | POA: Diagnosis present

## 2018-02-17 DIAGNOSIS — I714 Abdominal aortic aneurysm, without rupture: Secondary | ICD-10-CM | POA: Diagnosis present

## 2018-02-17 DIAGNOSIS — I2510 Atherosclerotic heart disease of native coronary artery without angina pectoris: Secondary | ICD-10-CM | POA: Diagnosis present

## 2018-02-17 DIAGNOSIS — M25551 Pain in right hip: Secondary | ICD-10-CM | POA: Diagnosis present

## 2018-02-17 DIAGNOSIS — F17200 Nicotine dependence, unspecified, uncomplicated: Secondary | ICD-10-CM | POA: Diagnosis present

## 2018-02-17 DIAGNOSIS — Z8679 Personal history of other diseases of the circulatory system: Secondary | ICD-10-CM | POA: Diagnosis not present

## 2018-02-17 DIAGNOSIS — I998 Other disorder of circulatory system: Secondary | ICD-10-CM | POA: Diagnosis present

## 2018-02-17 DIAGNOSIS — I743 Embolism and thrombosis of arteries of the lower extremities: Secondary | ICD-10-CM | POA: Diagnosis present

## 2018-02-17 DIAGNOSIS — T82868A Thrombosis of vascular prosthetic devices, implants and grafts, initial encounter: Secondary | ICD-10-CM | POA: Diagnosis not present

## 2018-02-17 DIAGNOSIS — Z794 Long term (current) use of insulin: Secondary | ICD-10-CM

## 2018-02-17 DIAGNOSIS — Z7984 Long term (current) use of oral hypoglycemic drugs: Secondary | ICD-10-CM

## 2018-02-17 DIAGNOSIS — R001 Bradycardia, unspecified: Secondary | ICD-10-CM | POA: Diagnosis not present

## 2018-02-17 DIAGNOSIS — L8990 Pressure ulcer of unspecified site, unspecified stage: Secondary | ICD-10-CM | POA: Diagnosis present

## 2018-02-17 DIAGNOSIS — Z7982 Long term (current) use of aspirin: Secondary | ICD-10-CM | POA: Diagnosis not present

## 2018-02-17 DIAGNOSIS — I739 Peripheral vascular disease, unspecified: Secondary | ICD-10-CM | POA: Diagnosis not present

## 2018-02-17 HISTORY — DX: Abdominal aortic aneurysm, without rupture, unspecified: I71.40

## 2018-02-17 HISTORY — DX: Abdominal aortic aneurysm, without rupture: I71.4

## 2018-02-17 HISTORY — PX: THROMBECTOMY FEMORAL ARTERY: SHX6406

## 2018-02-17 HISTORY — PX: EMBOLECTOMY: SHX44

## 2018-02-17 HISTORY — PX: FEMORAL-FEMORAL BYPASS GRAFT: SHX936

## 2018-02-17 LAB — PROTIME-INR
INR: 1.16
INR: 1.2
Prothrombin Time: 15.1 seconds (ref 11.4–15.2)
Prothrombin Time: 14.7 seconds (ref 11.4–15.2)

## 2018-02-17 LAB — CBC
HEMATOCRIT: 47.8 % (ref 39.0–52.0)
HEMOGLOBIN: 16 g/dL (ref 13.0–17.0)
MCH: 32.5 pg (ref 26.0–34.0)
MCHC: 33.5 g/dL (ref 30.0–36.0)
MCV: 97.2 fL (ref 80.0–100.0)
Platelets: 225 10*3/uL (ref 150–400)
RBC: 4.92 MIL/uL (ref 4.22–5.81)
RDW: 12.3 % (ref 11.5–15.5)
WBC: 10.6 10*3/uL — AB (ref 4.0–10.5)
nRBC: 0 % (ref 0.0–0.2)

## 2018-02-17 LAB — BASIC METABOLIC PANEL
ANION GAP: 18 — AB (ref 5–15)
BUN: 18 mg/dL (ref 8–23)
CO2: 20 mmol/L — ABNORMAL LOW (ref 22–32)
CREATININE: 1.09 mg/dL (ref 0.61–1.24)
Calcium: 9.8 mg/dL (ref 8.9–10.3)
Chloride: 104 mmol/L (ref 98–111)
GFR calc non Af Amer: >60 mL/min (ref >60)
Glucose, Bld: 235 mg/dL — ABNORMAL HIGH (ref 70–99)
POTASSIUM: 5.3 mmol/L — AB (ref 3.5–5.1)
Sodium: 142 mmol/L (ref 135–145)

## 2018-02-17 LAB — URINALYSIS, COMPLETE (UACMP) WITH MICROSCOPIC
BACTERIA UA: NONE SEEN
BILIRUBIN URINE: NEGATIVE
Glucose, UA: NEGATIVE mg/dL
KETONES UR: 20 mg/dL — AB
Leukocytes, UA: NEGATIVE
Nitrite: NEGATIVE
PH: 5 (ref 5.0–8.0)
Protein, ur: NEGATIVE mg/dL
SQUAMOUS EPITHELIAL / LPF: NONE SEEN (ref 0–5)
Specific Gravity, Urine: 1.045 — ABNORMAL HIGH (ref 1.005–1.030)

## 2018-02-17 LAB — GLUCOSE, CAPILLARY
GLUCOSE-CAPILLARY: 183 mg/dL — AB (ref 70–99)
Glucose-Capillary: 243 mg/dL — ABNORMAL HIGH (ref 70–99)
Glucose-Capillary: 179 mg/dL — ABNORMAL HIGH (ref 70–99)
Glucose-Capillary: 147 mg/dL — ABNORMAL HIGH (ref 70–99)

## 2018-02-17 LAB — HEPARIN LEVEL (UNFRACTIONATED): Heparin Unfractionated: 0.88 [IU]/mL — ABNORMAL HIGH (ref 0.30–0.70)

## 2018-02-17 LAB — APTT: aPTT: 124 seconds — ABNORMAL HIGH (ref 24–36)

## 2018-02-17 SURGERY — THROMBECTOMY, ARTERY, FEMORAL
Anesthesia: General | Site: Groin | Laterality: Right

## 2018-02-17 SURGERY — ENDARTERECTOMY, FEMORAL
Anesthesia: General | Laterality: Bilateral

## 2018-02-17 MED ORDER — NICOTINE 21 MG/24HR TD PT24
21.0000 mg | MEDICATED_PATCH | Freq: Every day | TRANSDERMAL | Status: DC
  Administered 2018-02-17 – 2018-02-23 (×7): 21 mg via TRANSDERMAL
  Filled 2018-02-17 (×7): qty 1

## 2018-02-17 MED ORDER — SODIUM CHLORIDE 0.9 % IJ SOLN
INTRAMUSCULAR | Status: AC
  Filled 2018-02-17: qty 10

## 2018-02-17 MED ORDER — CEFAZOLIN SODIUM 1 G IJ SOLR
INTRAMUSCULAR | Status: AC
  Filled 2018-02-17: qty 10

## 2018-02-17 MED ORDER — HEPARIN SODIUM (PORCINE) 5000 UNIT/ML IJ SOLN
INTRAMUSCULAR | Status: AC
  Filled 2018-02-17: qty 1

## 2018-02-17 MED ORDER — CEFAZOLIN SODIUM-DEXTROSE 1-4 GM/50ML-% IV SOLN
INTRAVENOUS | Status: DC | PRN
  Administered 2018-02-17: 1 g via INTRAVENOUS

## 2018-02-17 MED ORDER — ROCURONIUM BROMIDE 50 MG/5ML IV SOLN
INTRAVENOUS | Status: AC
  Filled 2018-02-17: qty 1

## 2018-02-17 MED ORDER — ROCURONIUM BROMIDE 100 MG/10ML IV SOLN
INTRAVENOUS | Status: DC | PRN
  Administered 2018-02-17: 50 mg via INTRAVENOUS
  Administered 2018-02-17: 10 mg via INTRAVENOUS

## 2018-02-17 MED ORDER — EVICEL 2 ML EX KIT
PACK | CUTANEOUS | Status: DC | PRN
  Administered 2018-02-17: 2 mL via TOPICAL

## 2018-02-17 MED ORDER — PROPOFOL 10 MG/ML IV BOLUS
INTRAVENOUS | Status: AC
  Filled 2018-02-17: qty 20

## 2018-02-17 MED ORDER — PROPOFOL 10 MG/ML IV BOLUS
INTRAVENOUS | Status: DC | PRN
  Administered 2018-02-17: 140 mg via INTRAVENOUS

## 2018-02-17 MED ORDER — SODIUM CHLORIDE 0.9 % IV SOLN
INTRAVENOUS | Status: DC
  Administered 2018-02-17: 15:00:00 via INTRAVENOUS

## 2018-02-17 MED ORDER — LIDOCAINE HCL (PF) 2 % IJ SOLN
INTRAMUSCULAR | Status: AC
  Filled 2018-02-17: qty 10

## 2018-02-17 MED ORDER — TRAMADOL HCL 50 MG PO TABS
50.0000 mg | ORAL_TABLET | Freq: Four times a day (QID) | ORAL | Status: DC | PRN
  Administered 2018-02-17 – 2018-02-18 (×2): 50 mg via ORAL
  Filled 2018-02-17 (×2): qty 1

## 2018-02-17 MED ORDER — DONEPEZIL HCL 5 MG PO TABS
5.0000 mg | ORAL_TABLET | Freq: Every day | ORAL | Status: DC
  Administered 2018-02-17 – 2018-02-22 (×6): 5 mg via ORAL
  Filled 2018-02-17 (×7): qty 1

## 2018-02-17 MED ORDER — OXYCODONE HCL 5 MG PO TABS: 5.0000 mg | ORAL_TABLET | Freq: Once | ORAL | Status: DC | PRN

## 2018-02-17 MED ORDER — SODIUM CHLORIDE 0.9 % IV SOLN
INTRAVENOUS | Status: DC | PRN
  Administered 2018-02-17: 13:00:00 via INTRAVENOUS

## 2018-02-17 MED ORDER — SUGAMMADEX SODIUM 200 MG/2ML IV SOLN
INTRAVENOUS | Status: DC | PRN
  Administered 2018-02-17: 200 mg via INTRAVENOUS

## 2018-02-17 MED ORDER — SODIUM CHLORIDE 0.9 % IV SOLN
INTRAVENOUS | Status: DC | PRN
  Administered 2018-02-17: 250 mL via INTRAMUSCULAR

## 2018-02-17 MED ORDER — FENTANYL CITRATE (PF) 100 MCG/2ML IJ SOLN
INTRAMUSCULAR | Status: AC
  Filled 2018-02-17: qty 2

## 2018-02-17 MED ORDER — FENTANYL CITRATE (PF) 100 MCG/2ML IJ SOLN
INTRAMUSCULAR | Status: DC | PRN
  Administered 2018-02-17: 100 ug via INTRAVENOUS
  Administered 2018-02-17: 25 ug via INTRAVENOUS
  Administered 2018-02-17: 50 ug via INTRAVENOUS
  Administered 2018-02-17: 25 ug via INTRAVENOUS

## 2018-02-17 MED ORDER — HEPARIN BOLUS VIA INFUSION
4900.0000 [IU] | Freq: Once | INTRAVENOUS | Status: AC
  Administered 2018-02-17: 4900 [IU] via INTRAVENOUS
  Filled 2018-02-17: qty 4900

## 2018-02-17 MED ORDER — ASPIRIN EC 81 MG PO TBEC
81.0000 mg | DELAYED_RELEASE_TABLET | Freq: Every day | ORAL | Status: DC
  Administered 2018-02-17 – 2018-02-22 (×6): 81 mg via ORAL
  Filled 2018-02-17 (×6): qty 1

## 2018-02-17 MED ORDER — ONDANSETRON HCL 4 MG/2ML IJ SOLN
INTRAMUSCULAR | Status: DC | PRN
  Administered 2018-02-17: 4 mg via INTRAVENOUS

## 2018-02-17 MED ORDER — VITAMIN D3 25 MCG (1000 UNIT) PO TABS
5000.0000 [IU] | ORAL_TABLET | Freq: Every day | ORAL | Status: DC
  Administered 2018-02-17 – 2018-02-22 (×6): 5000 [IU] via ORAL
  Filled 2018-02-17 (×9): qty 5

## 2018-02-17 MED ORDER — PHENYLEPHRINE HCL 10 MG/ML IJ SOLN
INTRAMUSCULAR | Status: DC | PRN
  Administered 2018-02-17: 100 ug via INTRAVENOUS

## 2018-02-17 MED ORDER — VITAMIN B-12 1000 MCG PO TABS
1000.0000 ug | ORAL_TABLET | Freq: Every day | ORAL | Status: DC
  Administered 2018-02-17 – 2018-02-23 (×7): 1000 ug via ORAL
  Filled 2018-02-17 (×7): qty 1

## 2018-02-17 MED ORDER — ONDANSETRON HCL 4 MG/2ML IJ SOLN
INTRAMUSCULAR | Status: AC
  Filled 2018-02-17: qty 2

## 2018-02-17 MED ORDER — ONDANSETRON HCL 4 MG/2ML IJ SOLN: 4.0000 mg | Freq: Four times a day (QID) | INTRAMUSCULAR | Status: DC | PRN

## 2018-02-17 MED ORDER — COENZYME Q10 30 MG PO CAPS: 30.0000 mg | ORAL_CAPSULE | Freq: Three times a day (TID) | ORAL | Status: DC

## 2018-02-17 MED ORDER — INFLUENZA VAC SPLIT HIGH-DOSE 0.5 ML IM SUSY
0.5000 mL | PREFILLED_SYRINGE | INTRAMUSCULAR | Status: AC
  Administered 2018-02-19: 0.5 mL via INTRAMUSCULAR
  Filled 2018-02-17 (×2): qty 0.5

## 2018-02-17 MED ORDER — MIDAZOLAM HCL 2 MG/2ML IJ SOLN
INTRAMUSCULAR | Status: AC
  Filled 2018-02-17: qty 2

## 2018-02-17 MED ORDER — ACETAMINOPHEN 325 MG PO TABS: 650.0000 mg | ORAL_TABLET | Freq: Four times a day (QID) | ORAL | Status: DC | PRN

## 2018-02-17 MED ORDER — ONDANSETRON HCL 4 MG PO TABS: 4.0000 mg | ORAL_TABLET | Freq: Four times a day (QID) | ORAL | Status: DC | PRN

## 2018-02-17 MED ORDER — SODIUM CHLORIDE 0.9 % IV SOLN
INTRAVENOUS | Status: DC | PRN
  Administered 2018-02-17: 50 ug/min via INTRAVENOUS

## 2018-02-17 MED ORDER — OXYCODONE HCL 5 MG PO TABS: 5.0000 mg | ORAL_TABLET | ORAL | Status: DC | PRN

## 2018-02-17 MED ORDER — FENTANYL CITRATE (PF) 100 MCG/2ML IJ SOLN: 25.0000 ug | INTRAMUSCULAR | Status: DC | PRN

## 2018-02-17 MED ORDER — INSULIN ASPART 100 UNIT/ML ~~LOC~~ SOLN
0.0000 [IU] | Freq: Three times a day (TID) | SUBCUTANEOUS | Status: DC
  Administered 2018-02-17 – 2018-02-18 (×3): 2 [IU] via SUBCUTANEOUS
  Administered 2018-02-19 (×3): 1 [IU] via SUBCUTANEOUS
  Administered 2018-02-20 (×2): 2 [IU] via SUBCUTANEOUS
  Administered 2018-02-20: 3 [IU] via SUBCUTANEOUS
  Administered 2018-02-21 – 2018-02-22 (×5): 2 [IU] via SUBCUTANEOUS
  Administered 2018-02-23: 1 [IU] via SUBCUTANEOUS
  Administered 2018-02-23: 2 [IU] via SUBCUTANEOUS
  Filled 2018-02-17 (×16): qty 1

## 2018-02-17 MED ORDER — GABAPENTIN 300 MG PO CAPS: 300.0000 mg | ORAL_CAPSULE | Freq: Every day | ORAL | Status: DC | PRN

## 2018-02-17 MED ORDER — HEPARIN (PORCINE) IN NACL 100-0.45 UNIT/ML-% IJ SOLN
1300.0000 [IU]/h | INTRAMUSCULAR | Status: DC
  Administered 2018-02-17: 1300 [IU]/h via INTRAVENOUS
  Administered 2018-02-18: 1100 [IU]/h via INTRAVENOUS
  Administered 2018-02-19 – 2018-02-20 (×2): 1000 [IU]/h via INTRAVENOUS
  Administered 2018-02-21: 1150 [IU]/h via INTRAVENOUS
  Filled 2018-02-17 (×6): qty 250

## 2018-02-17 MED ORDER — CLOPIDOGREL BISULFATE 75 MG PO TABS
75.0000 mg | ORAL_TABLET | Freq: Every day | ORAL | Status: DC
  Administered 2018-02-17 – 2018-02-23 (×7): 75 mg via ORAL
  Filled 2018-02-17 (×8): qty 1

## 2018-02-17 MED ORDER — OXYCODONE HCL 5 MG/5ML PO SOLN: 5.0000 mg | Freq: Once | ORAL | Status: DC | PRN

## 2018-02-17 MED ORDER — GLIMEPIRIDE 2 MG PO TABS
2.0000 mg | ORAL_TABLET | Freq: Every day | ORAL | Status: DC
  Administered 2018-02-18 – 2018-02-23 (×6): 2 mg via ORAL
  Filled 2018-02-17 (×6): qty 1

## 2018-02-17 MED ORDER — LIDOCAINE HCL (CARDIAC) PF 100 MG/5ML IV SOSY
PREFILLED_SYRINGE | INTRAVENOUS | Status: DC | PRN
  Administered 2018-02-17: 100 mg via INTRAVENOUS

## 2018-02-17 MED ORDER — ACETAMINOPHEN 650 MG RE SUPP: 650.0000 mg | Freq: Four times a day (QID) | RECTAL | Status: DC | PRN

## 2018-02-17 MED ORDER — HEPARIN SODIUM (PORCINE) 1000 UNIT/ML IJ SOLN
INTRAMUSCULAR | Status: DC | PRN
  Administered 2018-02-17: 2500 [IU] via INTRAVENOUS

## 2018-02-17 MED ORDER — DOCUSATE SODIUM 100 MG PO CAPS
100.0000 mg | ORAL_CAPSULE | Freq: Two times a day (BID) | ORAL | Status: DC
  Administered 2018-02-17 – 2018-02-23 (×7): 100 mg via ORAL
  Filled 2018-02-17 (×8): qty 1

## 2018-02-17 MED ORDER — IOPAMIDOL (ISOVUE-370) INJECTION 76%
125.0000 mL | Freq: Once | INTRAVENOUS | Status: AC | PRN
  Administered 2018-02-17: 125 mL via INTRAVENOUS

## 2018-02-17 SURGICAL SUPPLY — 68 items
APPLIER CLIP 11 MED OPEN (CLIP)
APPLIER CLIP 13 LRG OPEN (CLIP)
APPLIER CLIP 9.375 SM OPEN (CLIP)
BAG COUNTER SPONGE EZ (MISCELLANEOUS) IMPLANT
BAG DECANTER FOR FLEXI CONT (MISCELLANEOUS) ×3 IMPLANT
BAG ISOLATATION DRAPE 20X20 ST (DRAPES) ×1 IMPLANT
BLADE SURG 15 STRL LF DISP TIS (BLADE) ×1 IMPLANT
BLADE SURG 15 STRL SS (BLADE) ×2
BLADE SURG SZ11 CARB STEEL (BLADE) ×3 IMPLANT
BOOT SUTURE AID YELLOW STND (SUTURE) ×3 IMPLANT
BRUSH SCRUB EZ  4% CHG (MISCELLANEOUS) ×2
BRUSH SCRUB EZ 4% CHG (MISCELLANEOUS) ×1 IMPLANT
CANISTER SUCT 1200ML W/VALVE (MISCELLANEOUS) ×3 IMPLANT
CATH EMBOLECTOMY 3X80 (CATHETERS) ×3 IMPLANT
CATH FOGERTY 4X80 WAS (CATHETERS) ×3 IMPLANT
CLIP APPLIE 11 MED OPEN (CLIP) IMPLANT
CLIP APPLIE 13 LRG OPEN (CLIP) IMPLANT
CLIP APPLIE 9.375 SM OPEN (CLIP) IMPLANT
COUNTER SPONGE BAG EZ (MISCELLANEOUS)
COVER WAND RF STERILE (DRAPES) ×3 IMPLANT
DERMABOND ADVANCED (GAUZE/BANDAGES/DRESSINGS) ×2
DERMABOND ADVANCED .7 DNX12 (GAUZE/BANDAGES/DRESSINGS) ×1 IMPLANT
DRAPE INCISE IOBAN 66X45 STRL (DRAPES) ×3 IMPLANT
DRAPE ISOLATE BAG 20X20 STRL (DRAPES) ×2
DURAPREP 26ML APPLICATOR (WOUND CARE) ×6 IMPLANT
ELECT CAUTERY BLADE 6.4 (BLADE) ×3 IMPLANT
ELECT REM PT RETURN 9FT ADLT (ELECTROSURGICAL) ×3
ELECTRODE REM PT RTRN 9FT ADLT (ELECTROSURGICAL) ×1 IMPLANT
GEL ULTRASOUND 20GR AQUASONIC (MISCELLANEOUS) IMPLANT
GLOVE BIO SURGEON STRL SZ7 (GLOVE) ×6 IMPLANT
GLOVE INDICATOR 7.5 STRL GRN (GLOVE) ×3 IMPLANT
GOWN STRL REUS W/ TWL LRG LVL3 (GOWN DISPOSABLE) ×2 IMPLANT
GOWN STRL REUS W/ TWL XL LVL3 (GOWN DISPOSABLE) ×1 IMPLANT
GOWN STRL REUS W/TWL LRG LVL3 (GOWN DISPOSABLE) ×4
GOWN STRL REUS W/TWL XL LVL3 (GOWN DISPOSABLE) ×2
HEMOSTAT SURGICEL 2X3 (HEMOSTASIS) ×6 IMPLANT
IV NS 500ML (IV SOLUTION) ×2
IV NS 500ML BAXH (IV SOLUTION) ×1 IMPLANT
KIT PREVENA INCISION MGT 13 (CANNISTER) ×3 IMPLANT
KIT TURNOVER KIT A (KITS) ×3 IMPLANT
LABEL OR SOLS (LABEL) ×3 IMPLANT
LOOP RED MAXI  1X406MM (MISCELLANEOUS) ×4
LOOP VESSEL MAXI 1X406 RED (MISCELLANEOUS) ×2 IMPLANT
LOOP VESSEL MINI 0.8X406 BLUE (MISCELLANEOUS) ×2 IMPLANT
LOOPS BLUE MINI 0.8X406MM (MISCELLANEOUS) ×4
MICROPUNCTURE 5FR NT-SST (MISCELLANEOUS) IMPLANT
NS IRRIG 500ML POUR BTL (IV SOLUTION) ×3 IMPLANT
PACK BASIN MAJOR ARMC (MISCELLANEOUS) ×3 IMPLANT
PACK UNIVERSAL (MISCELLANEOUS) ×3 IMPLANT
PAD PREP 24X41 OB/GYN DISP (PERSONAL CARE ITEMS) ×3 IMPLANT
PENCIL ELECTRO HAND CTR (MISCELLANEOUS) ×3 IMPLANT
SUT PROLENE 5 0 RB 1 DA (SUTURE) ×3 IMPLANT
SUT PROLENE 6 0 BV (SUTURE) ×12 IMPLANT
SUT PROLENE 7 0 BV 1 (SUTURE) ×3 IMPLANT
SUT SILK 2 0 (SUTURE) ×2
SUT SILK 2-0 18XBRD TIE 12 (SUTURE) ×1 IMPLANT
SUT SILK 3 0 (SUTURE) ×6
SUT SILK 3-0 18XBRD TIE 12 (SUTURE) ×3 IMPLANT
SUT SILK 4 0 (SUTURE)
SUT SILK 4-0 18XBRD TIE 12 (SUTURE) IMPLANT
SUT VIC AB 2-0 CT1 27 (SUTURE) ×4
SUT VIC AB 2-0 CT1 TAPERPNT 27 (SUTURE) ×2 IMPLANT
SUT VIC AB 3-0 SH 27 (SUTURE)
SUT VIC AB 3-0 SH 27X BRD (SUTURE) IMPLANT
SUT VICRYL+ 3-0 36IN CT-1 (SUTURE) ×6 IMPLANT
SYR 20CC LL (SYRINGE) ×3 IMPLANT
SYR 5ML LL (SYRINGE) ×3 IMPLANT
TRAY FOLEY MTR SLVR 16FR STAT (SET/KITS/TRAYS/PACK) ×3 IMPLANT

## 2018-02-17 NOTE — ED Provider Notes (Signed)
[facility] Emergency Department Provider Note  Time seen: 9:59 AM  I have reviewed the triage vital signs and the nursing notes.   HISTORY  Chief Complaint Fall and Weakness    HPI Kurt [name] is a 76 y.o. male the past medical history of AAA status post repair, diabetes, intermittent claudication, presents to the emergency department for lower extremity weakness.  According to the patient he states he fell 2 days ago, was having some right hip pain but states right hip pain has since gone away.  This morning he got up out of bed and felt that he could not walk on his legs.  States they both felt very weak and he was unable to get up.  He states normally he ambulates without any assistance although has a walker but states he rarely uses it.  Patient does state pain in the right lower extremity near his foot.  Largely negative review of systems otherwise.   Past Medical History:  Diagnosis Date  . AAA (abdominal aortic aneurysm) (HCC)   . Diabetes mellitus without complication [facility])     Patient Active Problem List   Diagnosis Date Noted  . Carotid stenosis 11/14/2017  . Diabetes mellitus type 2, uncomplicated (HCC) 03/02/2017  . Imbalance 07/06/2016  . 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 Tobacco dependence 05/06/2016  . Diabetes (HCC) 05/06/2016  . Abdominal aortic aneurysm without rupture (HCC) 05/06/2016  . Atherosclerosis of native arteries of extremity with intermittent claudication (HCC) 05/06/2016  . Atherosclerotic PVD with intermittent claudication (HCC) 05/20/2014    Past Surgical History:  Procedure Laterality Date  . ABDOMINAL AORTIC ANEURYSM REPAIR    . arm surgery Left     Prior to Admission medications   Medication Sig Start Date End Date Taking? Authorizing Provider  aspirin EC 81 MG tablet Take by mouth.    [provider]  Cholecalciferol (D-5000) 5000 units TABS Take by mouth.     [provider]  clopidogrel (PLAVIX) 75 MG tablet Take by mouth.    [provider]  clotrimazole-betamethasone (LOTRISONE) cream APPLY EXTERNALLY TO THE AFFECTED AREA TWICE DAILY Patient not taking: Reported on 11/14/2017 04/07/17   Felecia Shelling, DPM  co-enzyme Q-10 30 MG capsule Take 30 mg by mouth 3 (three) times daily.    [provider]  Coconut Oil 1000 MG CAPS Take by mouth.    [provider]  Coenzyme Q10 10 MG capsule Take by mouth.    [provider]  docusate sodium (COLACE) 100 MG capsule Take 100 mg by mouth 2 (two) times daily.    [provider]  donepezil (ARICEPT) 5 MG tablet Take by mouth. 02/15/17 03/17/17  [provider]  donepezil (ARICEPT) 5 MG tablet TK 1 T PO NIGHTLY 02/15/17   [provider]  FLUAD 0.5 ML SUSY ADM 0.5ML IM UTD 02/10/17   [provider]  gabapentin (NEURONTIN) 300 MG capsule  03/15/16   [provider]  GARLIC OIL PO Take by mouth.    [provider]  glimepiride (AMARYL) 2 MG tablet TK 1 T PO QD 03/14/16   [provider]  lidocaine (LIDODERM) 5 % Place 1 patch onto the skin every 12 (twelve) hours. Remove & Discard patch within 12 hours or as directed by MD Patient not taking: Reported on 11/14/2017 07/16/17 07/16/18  Willy Eddy, MD  metFORMIN (GLUCOPHAGE) 1000 MG tablet TK 1 T PO  BID 02/18/16  [provider]  Misc Natural Products (PROSTATE THERAPY COMPLEX PO) Take 562 mg by mouth.    [provider]  Red Yeast Rice 600 MG CAPS Take by mouth.    [provider]  terbinafine (LAMISIL) 250 MG tablet Take 1 tablet (250 mg total) by mouth daily. 03/31/17   Felecia Shelling, DPM  traMADol (ULTRAM) 50 MG tablet TK 1 T PO Q 6 H PRN 04/27/16   [provider]  traMADol (ULTRAM) 50 MG tablet Take 1 tablet (50 mg total) by mouth every 6 (six) hours as needed. Patient not taking: Reported on 11/14/2017 07/16/17  07/16/18  Willy Eddy, MD  vitamin B-12 (CYANOCOBALAMIN) 1000 MCG tablet Take by mouth.    [provider]    No Known Allergies  Family History  Problem Relation Age of Onset  . Alzheimer's disease Mother   . Heart disease Brother     Social History Social History   Tobacco Use  . Smoking status: Heavy Tobacco Smoker  . Smokeless tobacco: Current User  Substance Use Topics  . Alcohol use: Not Currently    Frequency: Never  . Drug use: Never    Review of Systems Constitutional: Negative for fever. Cardiovascular: Negative for chest pain. Respiratory: Negative for shortness of breath. Gastrointestinal: Negative for abdominal pain, vomiting  Musculoskeletal: Right lower extremity pain, weakness in both lower extremities. Skin: Negative for skin complaints  Neurological: Negative for headache All other ROS negative  ____________________________________________   PHYSICAL EXAM:  VITAL SIGNS: ED Triage Vitals  Enc Vitals Group     BP 02/17/18 0931 (!) 145/90     Pulse Rate 02/17/18 0935 (!) 107     Resp 02/17/18 0931 15     Temp 02/17/18 0935 97.6 F (36.4 C)     Temp Source 02/17/18 0935 Oral     SpO2 02/17/18 0927 96 %     Weight 02/17/18 0931 183 lb (83 kg)     Height 02/17/18 0931 6\' 2"  (1.88 m)     Head Circumference --      Peak Flow --      Pain Score 02/17/18 0931 0     Pain Loc --      Pain Edu? --      Excl. in GC? --    Constitutional: Alert and oriented.  No distress.  Smells strongly of urine. Eyes: Normal exam ENT   Head: Normocephalic and atraumatic   Mouth/Throat: Mucous membranes are moist. Cardiovascular: Normal rate, regular rhythm. No murmur Respiratory: Normal respiratory effort without tachypnea nor retractions. Breath sounds are clear Gastrointestinal: Soft and nontender. No distention.  Musculoskeletal: Patient has a pale and cool to the touch right lower extremity from the knee down.  States minimal sensation to  palpation, unable to palpate a pulse.  Unable to Doppler a pulse in either the DP or PT.  Patient is able to move the foot somewhat, unable to bend his toes. Neurologic:  Normal speech and language.  Decreased sensation in the right lower extremity below the knee. Skin:  Skin is warm, dry and intact.  Psychiatric: Mood and affect are normal.   ____________________________________________    EKG  EKG reviewed and interpreted by myself shows a sinus tachycardia at 107 bpm with a slightly widened QRS, left axis deviation, largely normal intervals with nonspecific ST changes.  ____________________________________________    RADIOLOGY  CT angiography pending  ____________________________________________   INITIAL IMPRESSION / ASSESSMENT AND PLAN / ED COURSE  Pertinent labs & imaging results that were available during my care of the patient were reviewed by me and considered in my medical decision making (see chart for details).  Department for weakness of bilateral lower extremity since this morning also states some pain in his right lower extremity.  Examination is very concerning for an ischemic right lower extremity.  Unable to palpate or Doppler pulses in the right lower extremity.  I have paged vascular surgery.  Awaiting lab results.    I discussed the patient with Dr. Wyn Quaker of vascular surgery.  Recommend starting the patient on heparin.  Also recommends obtaining CT imaging of the abdomen/pelvis with lower extremity runoffs.  I discussed this with radiology, they will adjust the order appropriately.   CRITICAL CARE Performed by: Minna Antis   Total critical care time: 30 minutes  Critical care time was exclusive of separately billable procedures and treating other patients.  Critical care was necessary to treat or prevent imminent or life-threatening deterioration.  Critical care was time spent personally by me on the following activities: development of treatment  plan with patient and/or surrogate as well as nursing, discussions with consultants, evaluation of patient's response to treatment, examination of patient, obtaining history from patient or surrogate, ordering and performing treatments and interventions, ordering and review of laboratory studies, ordering and review of radiographic studies, pulse oximetry and re-evaluation of patient's condition.  ____________________________________________   FINAL CLINICAL IMPRESSION(S) / ED DIAGNOSES  Right lower extremity ischemia    Minna Antis, MD 02/17/18 1054

## 2018-02-17 NOTE — Anesthesia Procedure Notes (Signed)
Procedure Name: Intubation Date/Time: 02/17/2018 1:20 PM Performed by: Ginger Carne, CRNA Pre-anesthesia Checklist: Patient identified, Emergency Drugs available, Suction available, Patient being monitored and Timeout performed Patient Re-evaluated:Patient Re-evaluated prior to induction Oxygen Delivery Method: Circle system utilized Preoxygenation: Pre-oxygenation with 100% oxygen Induction Type: IV induction Ventilation: Mask ventilation without difficulty and Oral airway inserted - appropriate to patient size Laryngoscope Size: Hyacinth Meeker and 2 Grade View: Grade I Tube type: Oral Tube size: 7.5 mm Number of attempts: 1 Airway Equipment and Method: Stylet Placement Confirmation: ETT inserted through vocal cords under direct vision,  positive ETCO2 and breath sounds checked- equal and bilateral Secured at: 21 cm Tube secured with: Tape Dental Injury: Teeth and Oropharynx as per pre-operative assessment

## 2018-02-17 NOTE — ED Notes (Signed)
Attempt to call report, Kandee Keen states she will call back as soon as patient care allows.

## 2018-02-17 NOTE — Anesthesia Preprocedure Evaluation (Addendum)
Anesthesia Evaluation  Patient identified by MRN, date of birth, ID band Patient awake    Reviewed: Allergy & Precautions, H&P , NPO status , Patient's Chart, lab work & pertinent test results  History of Anesthesia Complications Negative for: history of anesthetic complications  Airway Mallampati: II  TM Distance: >3 FB Neck ROM: full    Dental  (+) Poor Dentition, Upper Dentures, Lower Dentures   Pulmonary COPD, Current Smoker,           Cardiovascular Exercise Tolerance: Good (-) angina+ Peripheral Vascular Disease  (-) Past MI and (-) DOE negative cardio ROS       Neuro/Psych  Neuromuscular disease negative psych ROS   GI/Hepatic negative GI ROS, Neg liver ROS, neg GERD  ,  Endo/Other  diabetes, Type 2  Renal/GU      Musculoskeletal   Abdominal   Peds  Hematology negative hematology ROS (+)   Anesthesia Other Findings Ischemic leg  Past Medical History: No date: AAA (abdominal aortic aneurysm) (HCC) No date: Diabetes mellitus without complication (HCC)  Past Surgical History: No date: ABDOMINAL AORTIC ANEURYSM REPAIR No date: arm surgery; Left  BMI    Body Mass Index:  23.50 kg/m      Reproductive/Obstetrics negative OB ROS                            Anesthesia Physical Anesthesia Plan  ASA: V and emergent  Anesthesia Plan: General ETT   Post-op Pain Management:    Induction: Intravenous  PONV Risk Score and Plan: Ondansetron, Dexamethasone, Midazolam and Treatment may vary due to age or medical condition  Airway Management Planned: Oral ETT  Additional Equipment:   Intra-op Plan:   Post-operative Plan: Extubation in OR  Informed Consent: I have reviewed the patients History and Physical, chart, labs and discussed the procedure including the risks, benefits and alternatives for the proposed anesthesia with the patient or authorized representative who has  indicated his/her understanding and acceptance.   Dental Advisory Given  Plan Discussed with: Anesthesiologist, CRNA and Surgeon  Anesthesia Plan Comments: (Patient consented for risks of anesthesia including but not limited to:  - adverse reactions to medications - damage to teeth, lips or other oral mucosa - sore throat or hoarseness - Damage to heart, brain, lungs or loss of life  Patient voiced understanding.)        Anesthesia Quick Evaluation

## 2018-02-17 NOTE — Progress Notes (Signed)
PHARMACIST - PHYSICIAN ORDER COMMUNICATION  CONCERNING: P&T Medication Policy on Herbal Medications  DESCRIPTION:  This patient's order for:  Co enzyme Q 10  has been noted.  This product(s) is classified as an "herbal" or natural product. Due to a lack of definitive safety studies or FDA approval, nonstandard manufacturing practices, plus the potential risk of unknown drug-drug interactions while on inpatient medications, the Pharmacy and Therapeutics Committee does not permit the use of "herbal" or natural products of this type within [facility]: [facility] Rhonda W.   ACTION TAKEN: The pharmacy department is unable to verify this order at this time and your patient has been informed of this safety policy. Please reevaluate patient's clinical condition at discharge and address if the herbal or natural product(s) should be resumed at that time.  Abbie Sons, PharmD 02/17/18

## 2018-02-17 NOTE — ED Notes (Signed)
Heparin verified with Rosey Bath, RN

## 2018-02-17 NOTE — ED Notes (Signed)
Report to Denyse Amass, RN also updated pt is going to OR before coming to floor.

## 2018-02-17 NOTE — ED Notes (Signed)
Pt states "I kinda hope the surgery don't go all that good and I can get rich by suing the Doctor." Pt then laughs and asks if why I am not laughing. I explained this is not appropriate comments. Pt states "Well get use to it cause that is who I am."

## 2018-02-17 NOTE — Progress Notes (Signed)
Family Meeting Note  Advance Directive:yes Today a meeting took place with the pt  Patient has long-standing history of PAD diet, diabetes, COPD with ongoing heavy tobacco abuse. Comes in with right lower extremity claudication/rest pain. Found to have right ischemic foot on CT angiogram. Started on IV heparin drip. Discuss code status. Patient wishes to be full code. Sister present in the emergency room.   Time spent during discussion 17 minutes. Enedina Finner, MD

## 2018-02-17 NOTE — Op Note (Signed)
OPERATIVE NOTE   PROCEDURE: 1. Right common femoral artery thromboendarterectomy 2.   Fogarty embolectomy of the left to right femoral to femoral bypass with a 4 Fogarty embolectomy balloon 3.   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 Thrombus with intimal hyperplasia on opening the hood of the graft and the common femoral artery.  Thrombosis of the bypass graft.   SPECIMEN(S):Thrombus and plaque from the common femoral artery.  A large amount of thrombus from the femoral to femoral bypass.  A small amount of thrombus from the right profunda femoris artery.  INDICATIONS:  Patient presents with acute ischemia of the right leg with neurologic changes and pallor.  He had known significant PAD.  His CT scan today shows an occlusion of his femoral to femoral bypass with thrombus in right femoral artery which is a change from noninvasive studies 3 months ago.  Right femoral exploration and thrombectomy is planned.  Is planned to try to improve perfusion. The risks and benefits as well as alternative therapies including intervention were reviewed in detail all questions were answered the patient agrees to proceed with surgery.  DESCRIPTION: After obtaining full informed written consent, the patient was brought back to the operating room and placed supine upon the operating table. The patient received IV antibiotics prior to induction. After obtaining adequate anesthesia, the patient was prepped and draped in the standard fashion appropriate time out is called.   Vertical incision was created overlying the right femoral arteries. The common femoral artery  proximally, and superficial femoral artery, and primary profunda femoris artery branches were encircled with vessel loops and prepared for control.  The graft was also dissected out several centimeters proximal to its anastomosis to the femoral artery.  The patient was given a small dose of 2500 units of intravenous heparin as he was already on a heparin drip. Attention is then turned to the right femoral artery. An left ostomy is made with 11 blade and extended with Potts scissors in the common femoral artery.  This was done on the hood of the graft.  There was a large blob of thrombus that it was immediately obvious in the common femoral artery as well as some hyperplastic tissue.  This was removed and sent as part of the specimen.  The graft was clearly thrombosed.  A 4 Fogarty embolectomy balloon was then passed which passed about 20 cm which given the small size was roughly the anastomosis of the left femoral artery.  A large amount of thrombus was pulled back with brisk inflow bleeding.  A second pass with a 4 Fogarty embolectomy balloon was performed in the femoral to femoral bypass graft with no further thrombus returned and brisk inflow seen. He had a chronic SFA occlusion so this was not really addressed.  A 3 Fogarty embolectomy balloon was then passed down the profunda femoris artery.  This passed easily about 18 to 20 cm and then pulled back a smaller amount of thrombus with good return of backbleeding.  A second pass with a 3 Fogarty embolectomy balloon was performed with no significant return of thrombus and good backbleeding seen.  The profunda femoris artery, bypass graft, and common femoral artery was locally heparinized  with injections of heparinized saline.  Care was taken to make sure that all thrombus was removed from the right common femoral artery that was visible.  I then proceeded with closure.  The graftotomy that was fitted onto the distal common femoral artery was closed with a  running 5-0 Prolene suture.  Easily palpable pulse was felt in the profunda femoris artery and the common femoral artery as well as in the proximal superficial femoral artery although it was known that the superficial femoral artery was occluded more distally.  Surgicel and Evicel topical hemostatic agents were placed in the femoral incision and hemostasis was complete. The femoral incision was then closed in a layered fashion with 2 layers of 2-0 Vicryl, a layer of 3-0 Vicryl, and 4-0 Monocryl was used for the skin.  An incisional VAC was placed over the wound and connected to suction with good seal obtained  The patient was then awakened from anesthesia and taken to the recovery room in stable condition having tolerated the procedure well.  COMPLICATIONS: None  CONDITION: Stable     [name] 02/17/2018 2:49 PM   This note was created with Dragon Medical transcription system. Any errors in dictation are purely unintentional.

## 2018-02-17 NOTE — Progress Notes (Signed)
Pt admitted to unit from PACU. Pt is A&Ox4, VSS. Pt has prevena wound vac to right groin,has very faint pedal pulse, right foot is pale and cool. Dr. Wyn Quaker notified- stated that as long as pt is not in severe pain this is acceptable. Tele applied, foley draining. Pt oriented to room and plan of care. Bed in lowest position, call bell within reach.

## 2018-02-17 NOTE — Consult Note (Signed)
ANTICOAGULATION CONSULT NOTE - Follow up consult  Pharmacy Consult for heparin infusion Indication: ischemia of RLE  No Known Allergies  Patient Measurements: Height: 6\' 2"  (188 cm) Weight: 183 lb (83 kg) IBW/kg (Calculated) : 82.2 Heparin Dosing Weight: 83  Vital Signs: Temp: 98.2 F (36.8 C) (10/26 2008) Temp Source: Oral (10/26 2008) BP: 137/74 (10/26 2008) Pulse Rate: 102 (10/26 2008)  Labs: Recent Labs    02/17/18 0934 02/17/18 1511 02/17/18 2022  HGB 16.0  --   --   HCT 47.8  --   --   PLT 225  --   --   APTT  --  124*  --   LABPROT  --  14.7  15.1  --   INR  --  1.16  1.20  --   HEPARINUNFRC  --   --  0.88*  CREATININE 1.09  --   --     Estimated Creatinine Clearance: 68.1 mL/min (by C-G formula based on SCr of 1.09 mg/dL).   Medical History: Past Medical History:  Diagnosis Date  . AAA (abdominal aortic aneurysm) (HCC)   . Diabetes mellitus without complication (HCC)     Medications:    Assessment: 77 y.o. male the past medical history of AAA status post repair, diabetes, intermittent claudication, presents to the emergency department for lower extremity weakness.  Goal of Therapy:  Heparin level 0.3-0.7 units/ml Monitor platelets by anticoagulation protocol: Yes   Plan:  10/26 2022 heparin level 0.88 Will decrease rate to 1100 units/hour. Will draw heparin level every 8 hours until 2 consecutive therapeutic levels and then daily and CBC daily. Continue to monitor H&H and platelets  Orinda Kenner, PharmD 02/17/2018,8:54 PM

## 2018-02-17 NOTE — Progress Notes (Signed)
Co Q 10 not approved by P&T as supplement able to be administered inpatient.  Discontinued by pharmacy.  Abbie Sons, PharmD 02/17/18 16:17

## 2018-02-17 NOTE — ED Triage Notes (Signed)
Pt to ED via [facility] from home for leg weakness. Per EMS pt reports that he has been having leg weakness since Thursday. Pt states he was walking to the mailbox on Thursday and fell due losing his balance. Pt denies any pain at this time. EMS reports strong smell of urine. Pt is currently A & O x 4. VSS with EMS. Pt is in NAD at this time.

## 2018-02-17 NOTE — Anesthesia Postprocedure Evaluation (Signed)
Anesthesia Post Note  Patient: ELMA LIMAS  Procedure(s) Performed: THROMBECTOMY FEMORAL ARTERY (Right Groin) FOGERTY EMBOLECTOMY (Right Groin) BYPASS FEMORAL-FEMORAL ARTERY (Right Groin)  Patient location during evaluation: PACU Anesthesia Type: General Level of consciousness: awake and alert Pain management: pain level controlled Vital Signs Assessment: post-procedure vital signs reviewed and stable Respiratory status: spontaneous breathing, nonlabored ventilation, respiratory function stable and patient connected to nasal cannula oxygen Cardiovascular status: blood pressure returned to baseline and stable Postop Assessment: no apparent nausea or vomiting Anesthetic complications: no     Last Vitals:  Vitals:   02/17/18 1526 02/17/18 1540  BP: 139/84 (!) 152/72  Pulse: 93 95  Resp: 14   Temp: 36.8 C   SpO2: 95% 98%    Last Pain:  Vitals:   02/17/18 1526  TempSrc:   PainSc: 0-No pain                 Cleda Mccreedy Piscitello

## 2018-02-17 NOTE — Transfer of Care (Signed)
Immediate Anesthesia Transfer of Care Note  Patient: Kurt [name]  Procedure(s) Performed: THROMBECTOMY FEMORAL ARTERY (Right Groin) FOGERTY EMBOLECTOMY (Right Groin) BYPASS FEMORAL-FEMORAL ARTERY (Right Groin)  Patient Location: PACU  Anesthesia Type:General  Level of Consciousness: awake and alert   Airway & Oxygen Therapy: Patient Spontanous Breathing and Patient connected to face mask oxygen  Post-op Assessment: Report given to RN and Post -op Vital signs reviewed and stable  Post vital signs: Reviewed and stable  Last Vitals:  Vitals Value Taken Time  BP 153/75 02/17/2018  2:45 PM  Temp 36.7 C 02/17/2018  2:45 PM  Pulse 99 02/17/2018  2:45 PM  Resp 14 02/17/2018  2:48 PM  SpO2    Vitals shown include unvalidated device data.  Last Pain:  Vitals:   02/17/18 0935  TempSrc: Oral  PainSc:          Complications: No apparent anesthesia complications

## 2018-02-17 NOTE — ED Notes (Signed)
Josh from OR here to take pt up to OR. Informed him that consent had not been signed. Per Sharia Reeve they will have them sign consent upstairs.

## 2018-02-17 NOTE — Consult Note (Signed)
ANTICOAGULATION CONSULT NOTE - Initial Consult  Pharmacy Consult for heparin infusion Indication: ischemia of RLE  No Known Allergies  Patient Measurements: Height: 6\' 2"  (188 cm) Weight: 183 lb (83 kg) IBW/kg (Calculated) : 82.2 Heparin Dosing Weight: 83  Vital Signs: Temp: 97.6 F (36.4 C) (10/26 0935) Temp Source: Oral (10/26 0935) BP: 145/90 (10/26 0931) Pulse Rate: 107 (10/26 0935)  Labs: Recent Labs    02/17/18 0934  HGB 16.0  HCT 47.8  PLT 225  CREATININE 1.09    Estimated Creatinine Clearance: 68.1 mL/min (by C-G formula based on SCr of 1.09 mg/dL).   Medical History: Past Medical History:  Diagnosis Date  . AAA (abdominal aortic aneurysm) (HCC)   . Diabetes mellitus without complication (HCC)     Medications:    Assessment: 77 y.o. male the past medical history of AAA status post repair, diabetes, intermittent claudication, presents to the emergency department for lower extremity weakness.  Goal of Therapy:  Heparin level 0.3-0.7 units/ml Monitor platelets by anticoagulation protocol: Yes   Plan:  Give 4900 units bolus x 1 Start heparin infusion at 1300 units/hr Will draw heparin level every 8 hours until 2 consecutive therapeutic levels and then daily and CBC daily. Continue to monitor H&H and platelets  Orinda Kenner, PharmD 02/17/2018,10:42 AM

## 2018-02-17 NOTE — Consult Note (Signed)
[facility] Vascular Consult Note  MRN : [id number]  Kurt [name] is a 77 y.o. (18-Dec-1940) male who presents with chief complaint of  Chief Complaint  Patient presents with  . Fall  . Weakness  .  History of Present Illness:  I am asked to see the patient by Dr. Lenard Lance in the ER for an ischemic leg.  The patient is a 77 year old male known to our service with a long history of abdominal aortic aneurysm and peripheral arterial disease.  He is almost 4 years status post repair with a stent graft and a femoral to femoral bypass.  He has persistent claudication symptoms in both legs for many years but they have been stable.  This morning, he had the abrupt onset of numbness and coldness to his right foot and lower leg.  No inciting event or causative factor.  Has not had any recent surgery or trauma.  Was seen in the office about 3 months ago where his femoral to femoral bypass was open but he had known bilateral SFA occlusions with stable, moderate reduction of his ABIs.  He has undergone a CT angiogram which I have independently reviewed which shows an occlusion of his femoral to femoral bypass.  Current Facility-Administered Medications  Medication Dose Route Frequency Provider Last Rate Last Dose  . heparin ADULT infusion 100 units/mL (25000 units/253mL sodium chloride 0.45%)  1,300 Units/hr Intravenous Continuous Little Ishikawa, RPH      . heparin bolus via infusion 4,900 Units  4,900 Units Intravenous Once Little Ishikawa, [facility]       Current Outpatient Medications  Medication Sig Dispense Refill  . aspirin EC 81 MG tablet Take 81 mg by mouth daily.     . Cholecalciferol (D-5000) 5000 units TABS Take 5,000 Units by mouth daily.     . clopidogrel (PLAVIX) 75 MG tablet Take 75 mg by mouth daily.     Marland Kitchen co-enzyme Q-10 30 MG capsule Take 30 mg by mouth 3 (three) times daily.    . Coconut Oil 1000 MG CAPS Take 1,000 mg by mouth as needed (dry skin).      Marland Kitchen docusate sodium (COLACE) 100 MG capsule Take 100 mg by mouth 2 (two) times daily.    Marland Kitchen donepezil (ARICEPT) 5 MG tablet Take 5 mg by mouth at bedtime.   1  . gabapentin (NEURONTIN) 300 MG capsule Take 300 mg by mouth daily as needed (Pain).   1  . GARLIC OIL PO Take 1 capsule by mouth daily as needed (cholesterol).     Marland Kitchen glimepiride (AMARYL) 2 MG tablet Take 2 mg by mouth daily with breakfast.   12  . metFORMIN (GLUCOPHAGE) 1000 MG tablet Take 1,000 mg by mouth 2 (two) times daily with a meal.   3  . Misc Natural Products (PROSTATE THERAPY COMPLEX PO) Take 562 mg by mouth daily.     . Red Yeast Rice 600 MG CAPS Take 1 capsule by mouth daily.     . traMADol (ULTRAM) 50 MG tablet Take 50 mg by mouth every 6 (six) hours as needed for moderate pain.   3  . vitamin B-12 (CYANOCOBALAMIN) 1000 MCG tablet Take 1,000 mcg by mouth daily.       Past Medical History:  Diagnosis Date  . AAA (abdominal aortic aneurysm) (HCC)   . Diabetes mellitus without complication [facility])     Past Surgical History:  Procedure Laterality Date  . ABDOMINAL AORTIC ANEURYSM REPAIR    .  arm surgery Left     Social History Social History   Tobacco Use  . Smoking status: Heavy Tobacco Smoker  . Smokeless tobacco: Current User  Substance Use Topics  . Alcohol use: Not Currently    Frequency: Never  . Drug use: Never    Family History Family History  Problem Relation Age of Onset  . Alzheimer's disease Mother   . Heart disease Brother   No bleeding or clotting disorders  No Known Allergies   REVIEW OF SYSTEMS (Negative unless checked)  Constitutional: [] Weight loss  [] Fever  [] Chills Cardiac: [] Chest pain   [] Chest pressure   [] Palpitations   [] Shortness of breath when laying flat   [] Shortness of breath at rest   [] Shortness of breath with exertion. Vascular:  [x] Pain in legs with walking   [] Pain in legs at rest   [] Pain in legs when laying flat   [x] Claudication   [] Pain in feet when walking   [] Pain in feet at rest  [] Pain in feet when laying flat   [] History of DVT   [] Phlebitis   [] Swelling in legs   [] Varicose veins   [] Non-healing ulcers Pulmonary:   [] Uses home oxygen   [] Productive cough   [] Hemoptysis   [] Wheeze  [x] COPD   [] Asthma Neurologic:  [] Dizziness  [] Blackouts   [] Seizures   [] History of stroke   [] History of TIA  [] Aphasia   [] Temporary blindness   [] Dysphagia   [] Weakness or numbness in arms   [x] Weakness or numbness in legs Musculoskeletal:  [x] Arthritis   [] Joint swelling   [] Joint pain   [] Low back pain Hematologic:  [] Easy bruising  [] Easy bleeding   [] Hypercoagulable state   [] Anemic  [] Hepatitis Gastrointestinal:  [] Blood in stool   [] Vomiting blood  [] Gastroesophageal reflux/heartburn   [] Difficulty swallowing. Genitourinary:  [] Chronic kidney disease   [] Difficult urination  [] Frequent urination  [] Burning with urination   [] Blood in urine Skin:  [] Rashes   [] Ulcers   [] Wounds Psychological:  [] History of anxiety   []  History of major depression.  Physical Examination  Vitals:   02/17/18 0927 02/17/18 0931 02/17/18 0935  BP:  (!) 145/90   Pulse:   (!) 107  Resp:  15 14  Temp:   97.6 F (36.4 C)  TempSrc:   Oral  SpO2: 96%  97%  Weight:  83 kg   Height:  6\' 2"  (1.88 m)    Body mass index is 23.5 kg/m. Gen: Frail and somewhat debilitated, NAD Head: [place]/AT, No temporalis wasting. Ear/Nose/Throat: Hearing grossly intact, nares w/o erythema or drainage, oropharynx w/o Erythema/Exudate Eyes: Sclera non-icteric, conjunctiva clear Neck: Trachea midline.  No JVD.  Pulmonary:  Good air movement, respirations not labored, equal bilaterally.  Cardiac: RRR, no JVD Vascular:  Vessel Right Left  Radial Palpable Palpable                          PT  not palpable  not palpable  DP  not palpable  trace palpable   Gastrointestinal: soft, non-tender/non-distended. No guarding/reflex.  Musculoskeletal: Right foot is cold with very poor capillary refill.   He has decreased sensation in the right foot although he is still able to move his toes.  Left foot does not have great pedal pulses, but it is warm with good capillary refill no deformity or atrophy. No edema. Neurologic: Sensation grossly intact in extremities.  Symmetrical.  Speech is fluent. Motor exam as listed above. Psychiatric: Judgment intact, Mood &  affect appropriate for pt's clinical situation. Dermatologic: No rashes or ulcers noted.  No cellulitis or open wounds.       CBC Lab Results  Component Value Date   WBC 10.6 (H) 02/17/2018   HGB 16.0 02/17/2018   HCT 47.8 02/17/2018   MCV 97.2 02/17/2018   PLT 225 02/17/2018    BMET    Component Value Date/Time   NA 142 02/17/2018 0934   NA 139 05/17/2014 0529   K 5.3 (H) 02/17/2018 0934   K 3.6 05/17/2014 0529   CL 104 02/17/2018 0934   CL 105 05/17/2014 0529   CO2 20 (L) 02/17/2018 0934   CO2 27 05/17/2014 0529   GLUCOSE 235 (H) 02/17/2018 0934   GLUCOSE 113 (H) 05/17/2014 0529   BUN 18 02/17/2018 0934   BUN 8 05/17/2014 0529   CREATININE 1.09 02/17/2018 0934   CREATININE 0.95 05/17/2014 0529   CALCIUM 9.8 02/17/2018 0934   CALCIUM 8.7 05/17/2014 0529   GFRNONAA >60 02/17/2018 0934   GFRNONAA >60 05/17/2014 0529   GFRAA >60 02/17/2018 0934   GFRAA >60 05/17/2014 0529   Estimated Creatinine Clearance: 68.1 mL/min (by C-G formula based on SCr of 1.09 mg/dL).  COAG Lab Results  Component Value Date   INR 1.3 05/15/2014    Radiology No results found.    Assessment/Plan 1.  Ischemic right leg.  Will need attempt at revascularization for limb salvage.  If revascularization not successful, limb loss would be expected.  This is a critical and life and limb threatening situation.  Discussed with the patient the serious nature of the situation and will plan to take him to the operating room for an attempted thrombectomy as soon as a room is available 2.  Abdominal aortic aneurysm.  4 years status post repair.   Minimal residual aneurysm but this does complicate options for revascularization 3.  COPD.  Long-standing tobacco use.  We will try to optimize his respiratory status throughout the procedure and postoperatively 4.  DM. blood glucose control important in reducing the progression of atherosclerotic disease. Also, involved in wound healing. On appropriate medications.    [name], MD  02/17/2018 11:51 AM    This note was created with Dragon medical transcription system.  Any error is purely unintentional

## 2018-02-17 NOTE — Anesthesia Post-op Follow-up Note (Signed)
Anesthesia QCDR form completed.        

## 2018-02-17 NOTE — H&P (Signed)
[facility] [place] at [facility]   PATIENT NAME: Kurt [name]    MR#:  [id number]  DATE OF BIRTH:  [birth year]  DATE OF ADMISSION:  02/17/2018  PRIMARY CARE PHYSICIAN: Jaclyn Shaggy, MD   REQUESTING/REFERRING PHYSICIAN: Paduchowski  CHIEF COMPLAINT:   Right leg pain on and off with claudication HISTORY OF PRESENT ILLNESS:  Kurt [name]  is a 77 y.o. male with a known history of peripheral arterial disease status post femoral to femoral bypass in the past, stent placement, history of abdominal aortic aneurysm, diabetes comes to the emergency room with ongoing right leg pain more than left. He had a fall couple days ago. Patient continues to smoke heavily. Patient underwent CT angiogram shows occlusion of his femoral to femoral bypass. Dr. Wyn Quaker vascular surgery so put patient in the ER and recommended place on IV heparin drip with possible surgical intervention during the hospitalization  Patient is being admitted for right ischemic foot PAST MEDICAL HISTORY:   Past Medical History:  Diagnosis Date  . AAA (abdominal aortic aneurysm) (HCC)   . Diabetes mellitus without complication (HCC)     PAST SURGICAL HISTOIRY:   Past Surgical History:  Procedure Laterality Date  . ABDOMINAL AORTIC ANEURYSM REPAIR    . arm surgery Left     SOCIAL HISTORY:   Social History   Tobacco Use  . Smoking status: Heavy Tobacco Smoker  . Smokeless tobacco: Current User  Substance Use Topics  . Alcohol use: Not Currently    Frequency: Never    FAMILY HISTORY:   Family History  Problem Relation Age of Onset  . Alzheimer's disease Mother   . Heart disease Brother     DRUG ALLERGIES:  No Known Allergies  REVIEW OF SYSTEMS:  Review of Systems  Constitutional: Negative for chills, fever and weight loss.  HENT: Negative for ear discharge, ear pain and nosebleeds.   Eyes: Negative for blurred vision, pain and discharge.  Respiratory: Negative for sputum  production, shortness of breath, wheezing and stridor.   Cardiovascular: Positive for claudication. Negative for chest pain, palpitations, orthopnea and PND.  Gastrointestinal: Negative for abdominal pain, diarrhea, nausea and vomiting.  Genitourinary: Negative for frequency and urgency.  Musculoskeletal: Negative for back pain and joint pain.  Neurological: Negative for sensory change, speech change, focal weakness and weakness.  Psychiatric/Behavioral: Negative for depression and hallucinations. The patient is not nervous/anxious.      MEDICATIONS AT HOME:   Prior to Admission medications   Medication Sig Start Date End Date Taking? Authorizing Provider  aspirin EC 81 MG tablet Take 81 mg by mouth daily.    Yes [provider]  Cholecalciferol (D-5000) 5000 units TABS Take 5,000 Units by mouth daily.    Yes [provider]  clopidogrel (PLAVIX) 75 MG tablet Take 75 mg by mouth daily.    Yes [provider]  co-enzyme Q-10 30 MG capsule Take 30 mg by mouth 3 (three) times daily.   Yes [provider]  Coconut Oil 1000 MG CAPS Take 1,000 mg by mouth as needed (dry skin).    Yes [provider]  docusate sodium (COLACE) 100 MG capsule Take 100 mg by mouth 2 (two) times daily.   Yes [provider]  donepezil (ARICEPT) 5 MG tablet Take 5 mg by mouth at bedtime.  02/15/17  Yes [provider]  gabapentin (NEURONTIN) 300 MG capsule Take 300 mg by mouth daily as needed (Pain).  03/15/16  Yes  [provider]  GARLIC OIL PO Take 1 capsule by mouth daily as needed (cholesterol).    Yes [provider]  glimepiride (AMARYL) 2 MG tablet Take 2 mg by mouth daily with breakfast.  03/14/16  Yes [provider]  metFORMIN (GLUCOPHAGE) 1000 MG tablet Take 1,000 mg by mouth 2 (two) times daily with a meal.  02/18/16  Yes [provider]  Misc Natural Products (PROSTATE THERAPY COMPLEX PO) Take 562 mg by mouth  daily.    Yes [provider]  Red Yeast Rice 600 MG CAPS Take 1 capsule by mouth daily.    Yes [provider]  traMADol (ULTRAM) 50 MG tablet Take 50 mg by mouth every 6 (six) hours as needed for moderate pain.  04/27/16  Yes [provider]  vitamin B-12 (CYANOCOBALAMIN) 1000 MCG tablet Take 1,000 mcg by mouth daily.    Yes [provider]      VITAL SIGNS:  Blood pressure (!) 142/99, pulse (!) 106, temperature 97.6 F (36.4 C), temperature source Oral, resp. rate 19, height 6\' 2"  (1.88 m), weight 83 kg, SpO2 97 %.  PHYSICAL EXAMINATION:  GENERAL:  77 y.o.-year-old patient lying in the bed with no acute distress.  EYES: Pupils equal, round, reactive to light and accommodation. No scleral icterus. Extraocular muscles intact.  HEENT: Head atraumatic, normocephalic. Oropharynx and nasopharynx clear.  NECK:  Supple, no jugular venous distention. No thyroid enlargement, no tenderness.  LUNGS: Normal breath sounds bilaterally, no wheezing, rales,rhonchi or crepitation. No use of accessory muscles of respiration.  CARDIOVASCULAR: S1, S2 normal. No murmurs, rubs, or gallops.  ABDOMEN: Soft, nontender, nondistended. Bowel sounds present. No organomegaly or mass.  EXTREMITIES: right foot cold clammy. Unable to feel distal pulses. NEUROLOGIC: Cranial nerves II through XII are intact. Muscle strength 5/5 in all extremities. Sensation intact. Gait not checked.  PSYCHIATRIC: The patient is alert and oriented x 3.  SKIN: No obvious rash, lesion, or ulcer.   LABORATORY PANEL:   CBC Recent Labs  Lab 02/17/18 0934  WBC 10.6*  HGB 16.0  HCT 47.8  PLT 225   ------------------------------------------------------------------------------------------------------------------  Chemistries  Recent Labs  Lab 02/17/18 0934  NA 142  K 5.3*  CL 104  CO2 20*  GLUCOSE 235*  BUN 18  CREATININE 1.09  CALCIUM 9.8    ------------------------------------------------------------------------------------------------------------------  Cardiac Enzymes No results for input(s): TROPONINI in the last 168 hours. ------------------------------------------------------------------------------------------------------------------  RADIOLOGY:  Ct Angio Ao+bifem W & Or Wo Contrast  Result Date: 02/17/2018 CLINICAL DATA:  77 y.o. male the past medical history of AAA status post repair, diabetes, intermittent claudication, presents to the emergency department for lower extremity weakness. According to the patient he states he fell 2 days ago, was having some right hip pain but states right hip pain has since gone away. This morning he got up out of bed and felt that he could not walk on his legs. States they both felt very weak and he was unable to get up. He states normally he ambulates without any assistance although has a walker but states he rarely uses it. Patient does state pain in the right lower extremity near his foot. Largely negative review of systems otherwise. EXAM: CT ANGIOGRAPHY AOBIFEM WITHOUT AND WITH CONTRAST TECHNIQUE: CT imaging performed through the abdomen and pelvis and continued through the lower extremities during and following the dynamic enhancement of intravenous contrast with coronal and sagittal MPR and MIP reformatted images also acquired. CONTRAST:  ISOVUE-370 IOPAMIDOL (  ISOVUE-370) INJECTION 76% COMPARISON:  04/04/2014 FINDINGS: CTA FINDINGS Aorta: Native aorta distended to a maximum of 3.8 x 3.3 cm. Aneurysm has been excluded with a stent. Stent proximal aspect lies just below the origins of the renal arteries. It extends to the mid aorta where 1 of the 2 limbs is occluded. The patent limb below the bifurcation. There is significant mural thrombus that begins in the aorta above the stent becoming thicker and circumferential within the stent, where the lumen is narrowed to 13 mm. There is no  evidence of aortic rupture or of an endoleak. Celiac axis: Partly calcified plaque at the origin. No hemodynamically significant stenosis. SMA: Partly calcified plaque at the origin.  No stenosis. Renal arteries: Dual renal arteries on the left. There is also a small accessory branch on the right to the upper pole. Mild plaque noted at the origin of the renal arteries with no hemodynamically significant stenosis. IMA: Occluded. Common, internal and external iliac arteries: Right common iliac artery is occluded. There is a metal vascular coil at the bifurcation of the right common iliac. External iliac artery on the right is partly reconstituted distally just above the common femoral artery. Right internal iliac artery is reconstituted in the inferior pelvis. There is dilation of the left common iliac artery to 18 mm. A stent extends through the left external iliac artery. No significant stenosis of the common iliac artery. Internal iliac artery on the left is occluded but reconstituted distally. External iliac artery on the left shows atherosclerotic plaque without significant stenosis. Right lower extremity: Common iliac artery is severely narrowed. There is an occluded by femoral graft crossing the subcutaneous soft tissues just above the pubic symphysis. Significant plaque noted throughout a moderate to severely narrowed femoral artery. Popliteal artery is occluded. No visible enhancement of the trifurcation vessels or of arteries in the right foot. Left lower extremity: Atherosclerosis of the common femoral artery with no hemodynamically significant stenosis. Femoral artery occluded at its origin. Atherosclerotic plaque noted along the deep femoral artery without significant stenosis. This provides collateralized flow to the popliteal artery which shows atherosclerotic narrowing, but no occlusion. Flow is seen into the trifurcation vessels, most prominently the perineal artery with minimal flow noted to the  anterior tibial artery. Posterior tibial artery is partly reconstituted above the ankle, occluded proximally. Minimal flow is seen into the left foot. NON CTA FINDINGS Lung bases: No acute findings. Hepatic biliary: Gallbladder is distended with wall thickening and mild adjacent inflammatory haziness. No discrete gallstones. No bile duct dilation. Liver is unremarkable. Pancreas: No mass or inflammation. Spleen: Normal in size.  No mass or focal lesion. Adrenal glands: Mixed attenuation left adrenal mass measuring 2.9 x 2.1 x 2.6 cm with small areas of macroscopic fat and small calcifications. This is unchanged in size from the prior CT. Normal right adrenal gland. Kidneys, ureters, bladder: Symmetric renal enhancement. Kidneys normal in size orientation and position. Low-density bilobed mass mid to lower pole of the right kidney, consistent with a cyst. Subcentimeter low-density mass, left lower pole anterior cortex consistent with a cyst. These are similar to the prior CT. No stones or hydronephrosis. Normal ureters. Normal bladder. Gastrointestinal: Stomach is unremarkable. No bowel dilation, wall thickening or inflammation. Normal appendix visualized. Rectum moderately distended with stool. Lymphatic: No pathologically enlarged lymph nodes. Other: No ascites.  No hernia. Musculoskeletal: No fracture or acute finding. No osteoblastic or osteolytic lesions. Review of the MIP images confirms the above findings. IMPRESSION: CTA 1. Status post  aortic aneurysm repair. No evidence of aneurysm rupture or of an endoleak. 2. Significant mural thrombus within the abdominal aorta at the level of the stent, without hemodynamically significant stenosis. 3. Chronically occluded right common iliac artery. Thrombosis bifemoral graft. 4. Occluded left femoral artery. Popliteal and calf vessels are reconstituted from the deep left femoral artery. 5. Significant atherosclerotic narrowing of the right common femoral artery and  femoral artery with occlusion of the popliteal artery and trifurcation vessels. NON CTA FINDINGS 1. Gallbladder wall distension all wall thickening and adjacent inflammation suggests acute cholecystitis in the proper clinical setting. Consider follow-up assessment with right upper quadrant ultrasound. 2. No other acute abnormality within the abdomen or pelvis. 3. 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 Electronically Signed   By: Amie Portland M.D.   On: 02/17/2018 12:43    EKG:    IMPRESSION AND PLAN:   Spike Desilets  is a 77 y.o. male with a known history of peripheral arterial disease status post femoral to femoral bypass in the past, stent placement, history of abdominal aortic aneurysm, diabetes comes to the emergency room with ongoing right leg pain more than left. He had a fall couple days ago. Patient continues to smoke heavily. Patient underwent CT angiogram shows occlusion of his femoral to femoral bypass.  1. Ischemic right leg -mid to MedSurg -IV heparin drip, aspirin, Plavix -appreciate Dr. Driscilla Grammes input. Patient will need attempt at revascularization for limb salvage. CT angiogram shows occlusion of femoral to femoral bypass.  -PRN pain meds  2. Diabetes -hold metformin given CT with dye -sliding scale insulin -continue amaryl  3. COPD with ongoing tobacco abuse -incentive spirometer PRN nebs  4. Heavy tobacco abuse patient advised smoking cessation. Patient not motivated to do so -will provide nicotine patch  5. DVT prophylaxis already on heparin drip   All the records are reviewed and case discussed with ED provider. Management plans discussed with the patient, family and they are in agreement.  CODE STATUS: full  TOTAL TIME TAKING CARE OF THIS PATIENT: *45* minutes.    Enedina Finner M.D on 02/17/2018 at 1:42 PM  Between 7am to 6pm - Pager - [phone]  After 6pm go to [email or web address] - password [email or web address]  [facility]  [facility]  Office  [phone]  CC: Primary care physician; Jaclyn Shaggy, MD

## 2018-02-18 ENCOUNTER — Encounter: Payer: Self-pay | Admitting: Vascular Surgery

## 2018-02-18 ENCOUNTER — Inpatient Hospital Stay
Admit: 2018-02-18 | Discharge: 2018-02-18 | Disposition: A | Discharge status: Home / Self Care | Payer: Medicare Other | Attending: Cardiovascular Disease | Admitting: Cardiovascular Disease

## 2018-02-18 DIAGNOSIS — L8990 Pressure ulcer of unspecified site, unspecified stage: Secondary | ICD-10-CM

## 2018-02-18 DIAGNOSIS — I472 Ventricular tachycardia: Secondary | ICD-10-CM

## 2018-02-18 DIAGNOSIS — R9431 Abnormal electrocardiogram [ECG] [EKG]: Secondary | ICD-10-CM

## 2018-02-18 LAB — BASIC METABOLIC PANEL
ANION GAP: 9 (ref 5–15)
Anion gap: 10 (ref 5–15)
BUN: 17 mg/dL (ref 8–23)
BUN: 20 mg/dL (ref 8–23)
CALCIUM: 8.9 mg/dL (ref 8.9–10.3)
CO2: 27 mmol/L (ref 22–32)
CO2: 26 mmol/L (ref 22–32)
CREATININE: 1.18 mg/dL (ref 0.61–1.24)
Calcium: 8.8 mg/dL — ABNORMAL LOW (ref 8.9–10.3)
Chloride: 100 mmol/L (ref 98–111)
Chloride: 102 mmol/L (ref 98–111)
Creatinine, Ser: 1.13 mg/dL (ref 0.61–1.24)
GFR calc Af Amer: >60 mL/min (ref >60)
GFR, EST NON AFRICAN AMERICAN: 59 mL/min — AB (ref >60)
GLUCOSE: 166 mg/dL — AB (ref 70–99)
Glucose, Bld: 240 mg/dL — ABNORMAL HIGH (ref 70–99)
POTASSIUM: 3.5 mmol/L (ref 3.5–5.1)
POTASSIUM: 3.8 mmol/L (ref 3.5–5.1)
SODIUM: 137 mmol/L (ref 135–145)
Sodium: 137 mmol/L (ref 135–145)

## 2018-02-18 LAB — GLUCOSE, CAPILLARY
GLUCOSE-CAPILLARY: 163 mg/dL — AB (ref 70–99)
GLUCOSE-CAPILLARY: 196 mg/dL — AB (ref 70–99)
GLUCOSE-CAPILLARY: 127 mg/dL — AB (ref 70–99)
Glucose-Capillary: 186 mg/dL — ABNORMAL HIGH (ref 70–99)
Glucose-Capillary: 164 mg/dL — ABNORMAL HIGH (ref 70–99)

## 2018-02-18 LAB — CBC
HCT: 40.3 % (ref 39.0–52.0)
HCT: 35.5 % — ABNORMAL LOW (ref 39.0–52.0)
Hemoglobin: 13.6 g/dL (ref 13.0–17.0)
Hemoglobin: 12.1 g/dL — ABNORMAL LOW (ref 13.0–17.0)
MCH: 32.9 pg (ref 26.0–34.0)
MCH: 32.4 pg (ref 26.0–34.0)
MCHC: 33.7 g/dL (ref 30.0–36.0)
MCHC: 34.1 g/dL (ref 30.0–36.0)
MCV: 97.3 fL (ref 80.0–100.0)
MCV: 95.2 fL (ref 80.0–100.0)
PLATELETS: 184 10*3/uL (ref 150–400)
Platelets: 175 K/uL (ref 150–400)
RBC: 4.14 MIL/uL — ABNORMAL LOW (ref 4.22–5.81)
RBC: 3.73 MIL/uL — ABNORMAL LOW (ref 4.22–5.81)
RDW: 12.3 % (ref 11.5–15.5)
RDW: 12.3 % (ref 11.5–15.5)
WBC: 10.7 K/uL — ABNORMAL HIGH (ref 4.0–10.5)
WBC: 8.5 10*3/uL (ref 4.0–10.5)
nRBC: 0 % (ref 0.0–0.2)
nRBC: 0 % (ref 0.0–0.2)

## 2018-02-18 LAB — TROPONIN I
TROPONIN I: 0.06 ng/mL — AB (ref <0.03)
TROPONIN I: 0.07 ng/mL — AB (ref <0.03)

## 2018-02-18 LAB — HEPARIN LEVEL (UNFRACTIONATED)
HEPARIN UNFRACTIONATED: 0.66 [IU]/mL (ref 0.30–0.70)
Heparin Unfractionated: 0.79 IU/mL — ABNORMAL HIGH (ref 0.30–0.70)

## 2018-02-18 LAB — ECHOCARDIOGRAM COMPLETE
HEIGHTINCHES: 74 in
WEIGHTICAEL: 2928 [oz_av]

## 2018-02-18 LAB — PHOSPHORUS: PHOSPHORUS: 2.6 mg/dL (ref 2.5–4.6)

## 2018-02-18 LAB — MAGNESIUM
MAGNESIUM: 1.5 mg/dL — AB (ref 1.7–2.4)
Magnesium: 2.1 mg/dL (ref 1.7–2.4)

## 2018-02-18 MED ORDER — POTASSIUM CHLORIDE 10 MEQ/100ML IV SOLN
10.0000 meq | INTRAVENOUS | Status: AC
  Administered 2018-02-18 (×2): 10 meq via INTRAVENOUS
  Filled 2018-02-18 (×2): qty 100

## 2018-02-18 MED ORDER — IPRATROPIUM-ALBUTEROL 0.5-2.5 (3) MG/3ML IN SOLN: 3.0000 mL | RESPIRATORY_TRACT | Status: DC | PRN

## 2018-02-18 MED ORDER — AMIODARONE HCL IN DEXTROSE 360-4.14 MG/200ML-% IV SOLN
60.0000 mg/h | INTRAVENOUS | Status: DC
  Administered 2018-02-18: 60 mg/h via INTRAVENOUS
  Filled 2018-02-18: qty 200

## 2018-02-18 MED ORDER — MAGNESIUM SULFATE 2 GM/50ML IV SOLN
2.0000 g | Freq: Once | INTRAVENOUS | Status: DC
  Filled 2018-02-18: qty 50

## 2018-02-18 MED ORDER — METOPROLOL TARTRATE 5 MG/5ML IV SOLN
5.0000 mg | Freq: Once | INTRAVENOUS | Status: DC
  Filled 2018-02-18: qty 5

## 2018-02-18 MED ORDER — POTASSIUM CHLORIDE CRYS ER 20 MEQ PO TBCR
40.0000 meq | EXTENDED_RELEASE_TABLET | Freq: Once | ORAL | Status: AC
  Administered 2018-02-18: 40 meq via ORAL
  Filled 2018-02-18: qty 2

## 2018-02-18 MED ORDER — AMIODARONE HCL IN DEXTROSE 360-4.14 MG/200ML-% IV SOLN
30.0000 mg/h | INTRAVENOUS | Status: DC
  Administered 2018-02-18 (×2): 30 mg/h via INTRAVENOUS
  Filled 2018-02-18: qty 200

## 2018-02-18 MED ORDER — AMIODARONE LOAD VIA INFUSION
150.0000 mg | Freq: Once | INTRAVENOUS | Status: AC
  Administered 2018-02-18: 150 mg via INTRAVENOUS
  Filled 2018-02-18: qty 83.34

## 2018-02-18 MED ORDER — PERFLUTREN LIPID MICROSPHERE
1.0000 mL | INTRAVENOUS | Status: AC | PRN
  Administered 2018-02-18: 2 mL via INTRAVENOUS
  Filled 2018-02-18: qty 10

## 2018-02-18 MED FILL — Medication: Qty: 1 | Status: AC

## 2018-02-18 NOTE — Progress Notes (Signed)
   02/18/18 1326  Clinical Encounter Type  Visited With Patient  Visit Type Code  Referral From Nurse  Consult/Referral To Chaplain  Spiritual Encounters  Spiritual Needs Emotional  Patient was receiving medical attention from several members of care team upon arrival. Patient was alert and answering questions. Bed was flat. Patient seemed confused but was able to answer questions. Pt's sister was going to be contacted and informed of patient's condition. Patient shared that he is a [place] and is secure with outcome. Pastoral care provided by being a non-anxious presence.

## 2018-02-18 NOTE — Consult Note (Signed)
ANTICOAGULATION CONSULT NOTE - Follow up consult  Pharmacy Consult for heparin infusion Indication: ischemia of RLE  No Known Allergies  Patient Measurements: Height: 6\' 2"  (188 cm) Weight: 183 lb (83 kg) IBW/kg (Calculated) : 82.2 Heparin Dosing Weight: 83  Vital Signs: Temp: 98 F (36.7 C) (10/27 0412) Temp Source: Oral (10/26 2319) BP: 152/76 (10/27 0412) Pulse Rate: 93 (10/27 0412)  Labs: Recent Labs    02/17/18 0934 02/17/18 1511 02/17/18 2022 02/18/18 0404  HGB 16.0  --   --  13.6  HCT 47.8  --   --  40.3  PLT 225  --   --  175  APTT  --  124*  --   --   LABPROT  --  14.7  15.1  --   --   INR  --  1.16  1.20  --   --   HEPARINUNFRC  --   --  0.88* 0.79*  CREATININE 1.09  --   --   --     Estimated Creatinine Clearance: 68.1 mL/min (by C-G formula based on SCr of 1.09 mg/dL).   Medical History: Past Medical History:  Diagnosis Date  . AAA (abdominal aortic aneurysm) (HCC)   . Diabetes mellitus without complication (HCC)     Medications:    Assessment: 77 y.o. male the past medical history of AAA status post repair, diabetes, intermittent claudication, presents to the emergency department for lower extremity weakness.  Goal of Therapy:  Heparin level 0.3-0.7 units/ml Monitor platelets by anticoagulation protocol: Yes   Plan:  10/27 @ 0700 HL 0.78 supratherapeutic. Will decrease rate to 1000 units/hr and will recheck HL @ 1500.  Thomasene Ripple, PharmD, BCPS Clinical Pharmacist 02/18/2018

## 2018-02-18 NOTE — Progress Notes (Signed)
Patient had V.Tach and rapid response was called and given IV amiodrone bolus and drip after 2 grams of IV magnesium. His Pottasium was 3.5 and will give PO and IV KCL to bring it to 4.Right now in NSR,will do cardiac enzymes and echo, and full consult to follow.

## 2018-02-18 NOTE — Progress Notes (Signed)
*  PRELIMINARY RESULTS* Echocardiogram 2D Echocardiogram has been performed. Definity contrast enhancer was administered.  Kurt [name] 02/18/2018, 5:10 PM

## 2018-02-18 NOTE — Consult Note (Signed)
ANTICOAGULATION CONSULT NOTE - Follow up consult  Pharmacy Consult for heparin infusion Indication: ischemia of RLE  No Known Allergies  Patient Measurements: Height: 6\' 2"  (188 cm) Weight: 183 lb (83 kg) IBW/kg (Calculated) : 82.2 Heparin Dosing Weight: 83  Vital Signs: Temp: 97.9 F (36.6 C) (10/27 1151) Temp Source: Axillary (10/27 1151) BP: 147/67 (10/27 1151) Pulse Rate: 93 (10/27 1151)  Labs: Recent Labs    02/17/18 0934 02/17/18 1511 02/17/18 2022 02/18/18 0404 02/18/18 0956 02/18/18 1545  HGB 16.0  --   --  13.6  --   --   HCT 47.8  --   --  40.3  --   --   PLT 225  --   --  175  --   --   APTT  --  124*  --   --   --   --   LABPROT  --  14.7  15.1  --   --   --   --   INR  --  1.16  1.20  --   --   --   --   HEPARINUNFRC  --   --  0.88* 0.79*  --  0.66  CREATININE 1.09  --   --   --  1.13  --   TROPONINI  --   --   --   --   --  0.06*    Estimated Creatinine Clearance: 65.7 mL/min (by C-G formula based on SCr of 1.13 mg/dL).   Medical History: Past Medical History:  Diagnosis Date  . AAA (abdominal aortic aneurysm) (HCC)   . Diabetes mellitus without complication (HCC)     Medications:    Assessment: 77 y.o. male the past medical history of AAA status post repair, diabetes, intermittent claudication, presents to the emergency department for lower extremity weakness.  Goal of Therapy:  Heparin level 0.3-0.7 units/ml Monitor platelets by anticoagulation protocol: Yes   Plan:  10/27 @ 0700 HL 0.78 supratherapeutic. Will decrease rate to 1000 units/hr and will recheck HL @ 1500.  10/27 @1545  HL= 0.66. Will continue current rate and check confirmatory level in 8 hours.  Bari Mantis PharmD Clinical Pharmacist 02/18/2018

## 2018-02-18 NOTE — Consult Note (Signed)
Name: Kurt [name] MRN: [id number] DOB: [birth year]     CONSULTATION DATE: 02/17/2018   HISTORY OF PRESENT ILLNESS:    77 years old man with history of peripheral artery disease status post revascularization, abdominal aortic aneurysm, diabetes, COPD was chronic tobacco abuse.  The patient was admitted with acute ischemia of the right lower extremity underwent right femoral thrombo-endarterectomy and has been on heparin drip.  Earlier he had an episode of nonsustained V. tach and was found to be hypomagnesemic, later he went nonsustained V. tach requiring amiodarone bolus and has been started on amiodarone drip.  All history was obtained from the hospitalist service and EMR. Patient arrived to the intensive care unit on heparin and amiodarone drip, no chest pain and no distress. PAST MEDICAL HISTORY :   has a past medical history of AAA (abdominal aortic aneurysm) (HCC) and Diabetes mellitus without complication (HCC).  has a past surgical history that includes arm surgery (Left); Abdominal aortic aneurysm repair; Thrombectomy femoral artery (Right, 02/17/2018); Embolectomy (Right, 02/17/2018); and Femoral-femoral Bypass Graft (Right, 02/17/2018). Prior to Admission medications   Medication Sig Start Date End Date Taking? Authorizing Provider  aspirin EC 81 MG tablet Take 81 mg by mouth daily.    Yes [provider]  Cholecalciferol (D-5000) 5000 units TABS Take 5,000 Units by mouth daily.    Yes [provider]  clopidogrel (PLAVIX) 75 MG tablet Take 75 mg by mouth daily.    Yes [provider]  co-enzyme Q-10 30 MG capsule Take 30 mg by mouth 3 (three) times daily.   Yes [provider]  Coconut Oil 1000 MG CAPS Take 1,000 mg by mouth as needed (dry skin).    Yes [provider]  docusate sodium (COLACE) 100 MG capsule Take 100 mg by mouth 2 (two) times daily.   Yes [provider]  donepezil (ARICEPT) 5 MG tablet Take 5 mg by mouth at  bedtime.  02/15/17  Yes [provider]  gabapentin (NEURONTIN) 300 MG capsule Take 300 mg by mouth daily as needed (Pain).  03/15/16  Yes [provider]  GARLIC OIL PO Take 1 capsule by mouth daily as needed (cholesterol).    Yes [provider]  glimepiride (AMARYL) 2 MG tablet Take 2 mg by mouth daily with breakfast.  03/14/16  Yes [provider]  metFORMIN (GLUCOPHAGE) 1000 MG tablet Take 1,000 mg by mouth 2 (two) times daily with a meal.  02/18/16  Yes [provider]  Misc Natural Products (PROSTATE THERAPY COMPLEX PO) Take 562 mg by mouth daily.    Yes [provider]  Red Yeast Rice 600 MG CAPS Take 1 capsule by mouth daily.    Yes [provider]  traMADol (ULTRAM) 50 MG tablet Take 50 mg by mouth every 6 (six) hours as needed for moderate pain.  04/27/16  Yes [provider]  vitamin B-12 (CYANOCOBALAMIN) 1000 MCG tablet Take 1,000 mcg by mouth daily.    Yes [provider]   No Known Allergies  FAMILY HISTORY:  family history includes Alzheimer's disease in his mother; Heart disease in his brother. SOCIAL HISTORY:  reports that he has been smoking. He uses smokeless tobacco. He reports that he drank alcohol. He reports that he does not use drugs.  REVIEW OF SYSTEMS:   Unable to obtain due to critical illness   VITAL SIGNS: Temp:  [97.6 F (36.4 C)-98.4 F (36.9 C)] 97.9 F (36.6 C) (10/27 1151) Pulse Rate:  [  93-104] 93 (10/27 1151) Resp:  [16-19] 18 (10/27 1151) BP: (124-164)/(67-85) 147/67 (10/27 1151) SpO2:  [93 %-100 %] 100 % (10/27 1151)  Physical Examination:  Awake and oriented with no focal neurological deficits On room air, no distress, able to talk in full sentences, bilateral equal air entry and no adventitious sounds S1 and S2 are audible with no murmur Benign abdominal exam with normal processes Wound VAC right groin status post right femoral thromboendarterectomy.  Good  peripheral vascularity and capillary refill   ASSESSMENT / PLAN: Sustained VT. Baseline CAD, right bundle branch block -Amiodarone drip status post bolus -Aspirin +  Plavix + BB -EKG and cardiac enzymes -Follows cardiology  POD #1 status post right femoral thromboendarterectomy.  On heparin drip -Management as per vascular team  COPD -Optimize bronchodilators  Diabetes mellitus -Glycemic control  Hypomagnesemia -Replete and monitor electrolytes  Full code  Supportive care  Critical care time 35 minutes

## 2018-02-18 NOTE — Significant Event (Signed)
Rapid Response Event Note  Overview: Time Called: 1332 Arrival Time: 1333 Event Type: Cardiac  Initial Focused Assessment: Arrived in patient's room and patient was alert in bed surrounded by nursing staff from 1A who were bringing in code cart. Patient was in [place] with rate in 212 on telemetry box. Patient converted self to SB 53 right after he was hooked up to code cart and then went back into VT in the 210s. Then he went back into NSR in 90s as Dr. Elisabeth Pigeon arrived at bedside. Patient 100% on room air, BP 136/74, MAP 94. Magnesium level of 1.5 on labs and Dr. Elisabeth Pigeon already ordered replacement.  Interventions: Magnesium replacement started. New IV started by Marijean Niemann RN due to one IV not working. Dr. Elisabeth Pigeon ordered amiodarone and transfer to stepdown. Dr. Elisabeth Pigeon spoke to Dr. Duanne Limerick the intensivist and stated he would consult cardiology.  Plan of Care (if not transferred): Transferred to room 18.   Event Summary: Name of Physician Notified: Dr. Elisabeth Pigeon at 1335    at    Outcome: Transferred (Comment)(ICU 18)  Event End Time: [address], [facility]

## 2018-02-18 NOTE — Progress Notes (Signed)
Notified Maggie, NP about troponin level of 0.07.  Carmel Sacramento, RN

## 2018-02-18 NOTE — Progress Notes (Signed)
[facility]  Daily Progress Note   Subjective  - 1 Day Post-Op  Patient had to be moved to the ICU this morning for sustained V. tach.  He is now in normal sinus rhythm with magnesium and potassium supplementation as well as an amiodarone bolus and drip.  He says his foot feels fine.  It is far warmer.  Good capillary refill bilaterally.  Objective Vitals:   02/17/18 2319 02/18/18 0412 02/18/18 0713 02/18/18 1151  BP: 127/69 (!) 152/76 (!) 142/72 (!) 147/67  Pulse: 98 93  93  Resp: 19 19 16 18   Temp: 98.2 F (36.8 C) 98 F (36.7 C) 98.4 F (36.9 C) 97.9 F (36.6 C)  TempSrc: Oral  Oral Axillary  SpO2: 98% 96% 93% 100%  Weight:      Height:        Intake/Output Summary (Last 24 hours) at 02/18/2018 1553 Last data filed at 02/18/2018 1500 Gross per 24 hour  Intake 597.48 ml  Output 875 ml  Net -277.52 ml    PULM  CTAB CV  RRR VASC  pedal pulses not easily palpable on either side, but good capillary refill with warm feet.  Motor and sensory are intact.  Incisional VAC has a good seal.  Laboratory CBC    Component Value Date/Time   WBC 10.7 (H) 02/18/2018 0404   HGB 13.6 02/18/2018 0404   HGB 13.7 05/17/2014 0529   HCT 40.3 02/18/2018 0404   HCT 39.9 (L) 05/17/2014 0529   PLT 175 02/18/2018 0404   PLT 149 (L) 05/17/2014 0529    BMET    Component Value Date/Time   NA 137 02/18/2018 0956   NA 139 05/17/2014 0529   K 3.5 02/18/2018 0956   K 3.6 05/17/2014 0529   CL 100 02/18/2018 0956   CL 105 05/17/2014 0529   CO2 27 02/18/2018 0956   CO2 27 05/17/2014 0529   GLUCOSE 240 (H) 02/18/2018 0956   GLUCOSE 113 (H) 05/17/2014 0529   BUN 17 02/18/2018 0956   BUN 8 05/17/2014 0529   CREATININE 1.13 02/18/2018 0956   CREATININE 0.95 05/17/2014 0529   CALCIUM 8.8 (L) 02/18/2018 0956   CALCIUM 8.7 05/17/2014 0529   GFRNONAA >60 02/18/2018 0956   GFRNONAA >60 05/17/2014 0529   GFRAA >60 02/18/2018 0956   GFRAA >60 05/17/2014 0529     Assessment/Planning: POD #1 s/p right femoral thrombo-endarterectomy, graft and profunda femoris artery embolectomy.   Vascular status is intact and his foot is warm and well-perfused.  Blood flow is at least as good as his baseline if not better.  He does have significant SFA occlusive disease and distal disease so his perfusion is not normal baseline.  Cardiac arrhythmias have made him transition to the ICU.  Appreciate cardiology input.  Would continue anticoagulation from a vascular standpoint and likely transition to oral anticoagulants in the next 2 to 3 days.    [name]  02/18/2018, 3:53 PM

## 2018-02-18 NOTE — Progress Notes (Signed)
  Amiodarone Drug - Drug Interaction Consult Note  Recommendations:  Amiodarone is metabolized by the cytochrome P450 system and therefore has the potential to cause many drug interactions. Amiodarone has an average plasma half-life of 50 days (range 20 to 100 days).   There is potential for drug interactions to occur several weeks or months after stopping treatment and the onset of drug interactions may be slow after initiating amiodarone.   [x]  Oral hypoglycemic agents (glyburide, glipizide, glimepiride): increased risk of hypoglycemia. Patient's glucose levels should be monitored closely when initiating amiodarone therapy.   [x]  Drugs that prolong the QT interval:  Torsades de pointes risk may be increased with concurrent use - avoid if possible.  Monitor QTc, also keep magnesium/potassium WNL if concurrent therapy can't be avoided. Marland Kitchen Antibiotics: e.g. fluoroquinolones, erythromycin. . Antiarrhythmics: e.g. quinidine, procainamide, disopyramide, sotalol. . Antipsychotics: e.g. phenothiazines, haloperidol.  . Lithium, tricyclic antidepressants, and methadone.  . Ondansetron  Thank You,  Orinda Kenner, PharmD 02/18/2018 1:50 PM

## 2018-02-18 NOTE — Progress Notes (Signed)
Pt alert but confused at night. No drainage to provena wound vac. Iv infusing without difficulty. No complaint of pain during the night.

## 2018-02-18 NOTE — Progress Notes (Signed)
Telemetry called notifying pt was in sustained vtach. MD notified and rapid called. Pt states his heart "feel funny". Pulse in the 210 range. New IV started in RFA 20 g and transferred pt to ICU.

## 2018-02-18 NOTE — Progress Notes (Signed)
Notified by telemetry pt had a 19 beat run of Vtach with a rate in the 190s, went in to check pt, all VS stable and MD notified.   Suzan Slick, RN

## 2018-02-18 NOTE — Progress Notes (Signed)
[facility] - [place] at [facility]   PATIENT NAME: Kurt [name]    MR#:  [id number]  DATE OF BIRTH:  03-Nov-1940  SUBJECTIVE:  CHIEF COMPLAINT:   Chief Complaint  Patient presents with  . Fall  . Weakness   Came with complaints of claudication on the right lower extremity and noted to have thrombosis.  Status post angioplasty. He had some nonsustained V. tach episodes in the morning, but he was asymptomatic. Later in the afternoon he had sustained V. tach for more than 5 minutes and was feeling palpitation and uneasy.  REVIEW OF SYSTEMS:  CONSTITUTIONAL: No fever, fatigue or weakness.  EYES: No blurred or double vision.  EARS, NOSE, AND THROAT: No tinnitus or ear pain.  RESPIRATORY: No cough, shortness of breath, wheezing or hemoptysis.  CARDIOVASCULAR: No chest pain, orthopnea, edema.  Had palpitation feeling with sustained V. tach episode. GASTROINTESTINAL: No nausea, vomiting, diarrhea or abdominal pain.  GENITOURINARY: No dysuria, hematuria.  ENDOCRINE: No polyuria, nocturia,  HEMATOLOGY: No anemia, easy bruising or bleeding SKIN: No rash or lesion. MUSCULOSKELETAL: No joint pain or arthritis.   NEUROLOGIC: No tingling, numbness, weakness.  PSYCHIATRY: No anxiety or depression.   ROS  DRUG ALLERGIES:  No Known Allergies  VITALS:  Blood pressure (!) 147/67, pulse 93, temperature 97.9 F (36.6 C), temperature source Axillary, resp. rate 18, height 6\' 2"  (1.88 m), weight 83 kg, SpO2 100 %.  PHYSICAL EXAMINATION:  GENERAL:  77 y.o.-year-old patient lying in the bed with no acute distress.  EYES: Pupils equal, round, reactive to light and accommodation. No scleral icterus. Extraocular muscles intact.  HEENT: Head atraumatic, normocephalic. Oropharynx and nasopharynx clear.  NECK:  Supple, no jugular venous distention. No thyroid enlargement, no tenderness.  LUNGS: Normal breath sounds bilaterally, no wheezing, rales,rhonchi or crepitation. No use of  accessory muscles of respiration.  CARDIOVASCULAR: S1, S2 normal in the morning, later he had irregular heartbeats and weak and fast pulses. No murmurs, rubs, or gallops.  ABDOMEN: Soft, nontender, nondistended. Bowel sounds present. No organomegaly or mass.  EXTREMITIES: No pedal edema, cyanosis, or clubbing.  During the sustained V. tach episode he had cold left upper extremity but very good palpable pulse on left radial.  Good pulses on both lower extremities. NEUROLOGIC: Cranial nerves II through XII are intact. Muscle strength 5/5 in all extremities. Sensation intact. Gait not checked.  PSYCHIATRIC: The patient is alert and oriented x 3.  And appeared in acute distress during my second visit during his ventricular tachycardia. SKIN: No obvious rash, lesion, or ulcer.   Physical Exam LABORATORY PANEL:   CBC Recent Labs  Lab 02/18/18 0404  WBC 10.7*  HGB 13.6  HCT 40.3  PLT 175   ------------------------------------------------------------------------------------------------------------------  Chemistries  Recent Labs  Lab 02/18/18 0956  NA 137  K 3.5  CL 100  CO2 27  GLUCOSE 240*  BUN 17  CREATININE 1.13  CALCIUM 8.8*  MG 1.5*   ------------------------------------------------------------------------------------------------------------------  Cardiac Enzymes Recent Labs  Lab 02/18/18 1545  TROPONINI 0.06*   ------------------------------------------------------------------------------------------------------------------  RADIOLOGY:  Ct Angio Ao+bifem W & Or Wo Contrast  Result Date: 02/17/2018 CLINICAL DATA:  77 y.o. male the past medical history of AAA status post repair, diabetes, intermittent claudication, presents to the emergency department for lower extremity weakness. According to the patient he states he fell 2 days ago, was having some right hip pain but states right hip pain has since gone away. This morning he got up out of bed  and felt that he could  not walk on his legs. States they both felt very weak and he was unable to get up. He states normally he ambulates without any assistance although has a walker but states he rarely uses it. Patient does state pain in the right lower extremity near his foot. Largely negative review of systems otherwise. EXAM: CT ANGIOGRAPHY AOBIFEM WITHOUT AND WITH CONTRAST TECHNIQUE: CT imaging performed through the abdomen and pelvis and continued through the lower extremities during and following the dynamic enhancement of intravenous contrast with coronal and sagittal MPR and MIP reformatted images also acquired. CONTRAST:  ISOVUE-370 IOPAMIDOL (ISOVUE-370) INJECTION 76% COMPARISON:  04/04/2014 FINDINGS: CTA FINDINGS Aorta: Native aorta distended to a maximum of 3.8 x 3.3 cm. Aneurysm has been excluded with a stent. Stent proximal aspect lies just below the origins of the renal arteries. It extends to the mid aorta where 1 of the 2 limbs is occluded. The patent limb below the bifurcation. There is significant mural thrombus that begins in the aorta above the stent becoming thicker and circumferential within the stent, where the lumen is narrowed to 13 mm. There is no evidence of aortic rupture or of an endoleak. Celiac axis: Partly calcified plaque at the origin. No hemodynamically significant stenosis. SMA: Partly calcified plaque at the origin.  No stenosis. Renal arteries: Dual renal arteries on the left. There is also a small accessory branch on the right to the upper pole. Mild plaque noted at the origin of the renal arteries with no hemodynamically significant stenosis. IMA: Occluded. Common, internal and external iliac arteries: Right common iliac artery is occluded. There is a metal vascular coil at the bifurcation of the right common iliac. External iliac artery on the right is partly reconstituted distally just above the common femoral artery. Right internal iliac artery is reconstituted in the inferior pelvis.  There is dilation of the left common iliac artery to 18 mm. A stent extends through the left external iliac artery. No significant stenosis of the common iliac artery. Internal iliac artery on the left is occluded but reconstituted distally. External iliac artery on the left shows atherosclerotic plaque without significant stenosis. Right lower extremity: Common iliac artery is severely narrowed. There is an occluded by femoral graft crossing the subcutaneous soft tissues just above the pubic symphysis. Significant plaque noted throughout a moderate to severely narrowed femoral artery. Popliteal artery is occluded. No visible enhancement of the trifurcation vessels or of arteries in the right foot. Left lower extremity: Atherosclerosis of the common femoral artery with no hemodynamically significant stenosis. Femoral artery occluded at its origin. Atherosclerotic plaque noted along the deep femoral artery without significant stenosis. This provides collateralized flow to the popliteal artery which shows atherosclerotic narrowing, but no occlusion. Flow is seen into the trifurcation vessels, most prominently the perineal artery with minimal flow noted to the anterior tibial artery. Posterior tibial artery is partly reconstituted above the ankle, occluded proximally. Minimal flow is seen into the left foot. NON CTA FINDINGS Lung bases: No acute findings. Hepatic biliary: Gallbladder is distended with wall thickening and mild adjacent inflammatory haziness. No discrete gallstones. No bile duct dilation. Liver is unremarkable. Pancreas: No mass or inflammation. Spleen: Normal in size.  No mass or focal lesion. Adrenal glands: Mixed attenuation left adrenal mass measuring 2.9 x 2.1 x 2.6 cm with small areas of macroscopic fat and small calcifications. This is unchanged in size from the prior CT. Normal right adrenal gland. Kidneys, ureters, bladder: Symmetric  renal enhancement. Kidneys normal in size orientation and  position. Low-density bilobed mass mid to lower pole of the right kidney, consistent with a cyst. Subcentimeter low-density mass, left lower pole anterior cortex consistent with a cyst. These are similar to the prior CT. No stones or hydronephrosis. Normal ureters. Normal bladder. Gastrointestinal: Stomach is unremarkable. No bowel dilation, wall thickening or inflammation. Normal appendix visualized. Rectum moderately distended with stool. Lymphatic: No pathologically enlarged lymph nodes. Other: No ascites.  No hernia. Musculoskeletal: No fracture or acute finding. No osteoblastic or osteolytic lesions. Review of the MIP images confirms the above findings. IMPRESSION: CTA 1. Status post aortic aneurysm repair. No evidence of aneurysm rupture or of an endoleak. 2. Significant mural thrombus within the abdominal aorta at the level of the stent, without hemodynamically significant stenosis. 3. Chronically occluded right common iliac artery. Thrombosis bifemoral graft. 4. Occluded left femoral artery. Popliteal and calf vessels are reconstituted from the deep left femoral artery. 5. Significant atherosclerotic narrowing of the right common femoral artery and femoral artery with occlusion of the popliteal artery and trifurcation vessels. NON CTA FINDINGS 1. Gallbladder wall distension all wall thickening and adjacent inflammation suggests acute cholecystitis in the proper clinical setting. Consider follow-up assessment with right upper quadrant ultrasound. 2. No other acute abnormality within the abdomen or pelvis. 3. 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 Electronically Signed   By: Amie Portland M.D.   On: 02/17/2018 12:43    ASSESSMENT AND PLAN:   Active Problems:   Ischemic foot   Pressure injury of skin  *Sustained ventricular tachycardia Magnesium level is low-replace IV. Potassium is 3.5, will replace and maintain around 4. Follow troponin and  echocardiogram. Given loading dose of IV amiodarone and started on amiodarone drip. Transferred to ICU. Discussed with ICU physician, vascular surgeon, cardiologist. Again confirmed with patient about his CODE STATUS and he confirms full code.  *  Ischemic right leg -IV heparin drip, aspirin, Plavix -Dr. Wyn Quaker helped  revascularization for limb salvage. CT angiogram shows occlusion of femoral to femoral bypass on arrival. -PRN pain meds Tinea anticoagulation per vascular for now.  *  Diabetes -hold metformin given CT with dye -sliding scale insulin -continue amaryl  * COPD with ongoing tobacco abuse -incentive spirometer PRN nebs  * Heavy tobacco abuse patient advised smoking cessation. Patient not motivated to do so -will provide nicotine patch  * DVT prophylaxis already on heparin drip    All the records are reviewed and case discussed with Care Management/Social Workerr. Management plans discussed with the patient, family and they are in agreement.  CODE STATUS: Full code  TOTAL TIME TAKING CARE OF THIS PATIENT: 50 critical care minutes.     POSSIBLE D/C IN 2-3 DAYS, DEPENDING ON CLINICAL CONDITION.   Altamese Dilling M.D on 02/18/2018   Between 7am to 6pm - Pager - [phone]  After 6pm go to [email or web address] - password [email or web address]  [facility]  Office  [phone]  CC: Primary care physician; Jaclyn Shaggy, MD  Note: This dictation was prepared with Dragon dictation along with smaller phrase technology. Any transcriptional errors that result from this process are unintentional.

## 2018-02-18 NOTE — Consult Note (Signed)
Kurt [name] is a 77 y.o. male  [id number]  Primary Cardiologist: [facility]  cardiology Reason for Consultation: Ventricular tachycardia  HPI: This is a 78 year old white male with a past medical history of peripheral vascular disease status post right femoral thromboendarterectomy, graft and profunda femoris artery endarterectomy by Dr.Dew, yesterday had sustained run of ventricular tachycardia after initially in the morning having 16 beats run of ventricular tachycardia.  Rapid response was done and IV amiodarone was started after on a bolus of 150 mg.  Also 2 g of magnesium sulfate and 40 mEq p.o. and 20 mg IV of KCl was given.  Patient just had a echocardiogram which has borderline left ventricular systolic function.  Troponins are only slightly elevated.  Patient denies any chest pain shortness of breath at this time.  He is comfortably sitting in chair and eating his dinner.   Review of Systems: No chest pain at this time but had chest pain when he was having ventricular tachycardia along with palpitation and dizziness and presyncope.   Past Medical History:  Diagnosis Date  . AAA (abdominal aortic aneurysm) ([place])   . Diabetes mellitus without complication ([place])     Medications Prior to Admission  Medication Sig Dispense Refill  . aspirin EC 81 MG tablet Take 81 mg by mouth daily.     . Cholecalciferol (D-5000) 5000 units TABS Take 5,000 Units by mouth daily.     . clopidogrel (PLAVIX) 75 MG tablet Take 75 mg by mouth daily.     Marland Kitchen co-enzyme Q-10 30 MG capsule Take 30 mg by mouth 3 (three) times daily.    . Coconut Oil 1000 MG CAPS Take 1,000 mg by mouth as needed (dry skin).     Marland Kitchen docusate sodium (COLACE) 100 MG capsule Take 100 mg by mouth 2 (two) times daily.    Marland Kitchen donepezil (ARICEPT) 5 MG tablet Take 5 mg by mouth at bedtime.   1  . gabapentin (NEURONTIN) 300 MG capsule Take 300 mg by mouth daily as needed (Pain).   1  . GARLIC OIL PO Take 1 capsule by mouth daily as needed  (cholesterol).     Marland Kitchen glimepiride (AMARYL) 2 MG tablet Take 2 mg by mouth daily with breakfast.   12  . metFORMIN (GLUCOPHAGE) 1000 MG tablet Take 1,000 mg by mouth 2 (two) times daily with a meal.   3  . Misc Natural Products (PROSTATE THERAPY COMPLEX PO) Take 562 mg by mouth daily.     . Red Yeast Rice 600 MG CAPS Take 1 capsule by mouth daily.     . traMADol (ULTRAM) 50 MG tablet Take 50 mg by mouth every 6 (six) hours as needed for moderate pain.   3  . vitamin B-12 (CYANOCOBALAMIN) 1000 MCG tablet Take 1,000 mcg by mouth daily.        Marland Kitchen aspirin EC  81 mg Oral Daily  . cholecalciferol  5,000 Units Oral Daily  . clopidogrel  75 mg Oral Daily  . docusate sodium  100 mg Oral BID  . donepezil  5 mg Oral QHS  . glimepiride  2 mg Oral Q breakfast  . Influenza vac split quadrivalent PF  0.5 mL Intramuscular Tomorrow-1000  . insulin aspart  0-9 Units Subcutaneous TID WC  . metoprolol tartrate  5 mg Intravenous Once  . nicotine  21 mg Transdermal Daily  . vitamin B-12  1,000 mcg Oral Daily    Infusions: . amiodarone 60 mg/hr (02/18/18 1424)  Followed by  . amiodarone    . heparin 1,000 Units/hr (02/18/18 1500)  . magnesium sulfate 1 - 4 g bolus IVPB    . potassium chloride 10 mEq (02/18/18 1730)    No Known Allergies  Social History   Socioeconomic History  . Marital status: Married    Spouse name: Not on file  . Number of children: Not on file  . Years of education: Not on file  . Highest education level: Not on file  Occupational History  . Not on file  Social Needs  . Financial resource strain: Not on file  . Food insecurity:    Worry: Not on file    Inability: Not on file  . Transportation needs:    Medical: Not on file    Non-medical: Not on file  Tobacco Use  . Smoking status: Heavy Tobacco Smoker  . Smokeless tobacco: Current User  Substance and Sexual Activity  . Alcohol use: Not Currently    Frequency: Never  . Drug use: Never  . Sexual activity: Not on  file  Lifestyle  . Physical activity:    Days per week: Not on file    Minutes per session: Not on file  . Stress: Not on file  Relationships  . Social connections:    Talks on phone: Not on file    Gets together: Not on file    Attends religious service: Not on file    Active member of club or organization: Not on file    Attends meetings of clubs or organizations: Not on file    Relationship status: Not on file  . Intimate partner violence:    Fear of current or ex partner: Not on file    Emotionally abused: Not on file    Physically abused: Not on file    Forced sexual activity: Not on file  Other Topics Concern  . Not on file  Social History Narrative  . Not on file    Family History  Problem Relation Age of Onset  . Alzheimer's disease Mother   . Heart disease Brother     PHYSICAL EXAM: Vitals:   02/18/18 0713 02/18/18 1151  BP: (!) 142/72 (!) 147/67  Pulse:  93  Resp: 16 18  Temp: 98.4 F (36.9 C) 97.9 F (36.6 C)  SpO2: 93% 100%     Intake/Output Summary (Last 24 hours) at 02/18/2018 1807 Last data filed at 02/18/2018 1500 Gross per 24 hour  Intake 597.48 ml  Output 875 ml  Net -277.52 ml    General:  Well appearing. No respiratory difficulty HEENT: normal Neck: supple. no JVD. Carotids 2+ bilat; no bruits. No lymphadenopathy or thryomegaly appreciated. Cor: PMI nondisplaced. Regular rate & rhythm. No rubs, gallops or murmurs. Lungs: clear Abdomen: soft, nontender, nondistended. No hepatosplenomegaly. No bruits or masses. Good bowel sounds. Extremities: no cyanosis, clubbing, rash, edema Neuro: alert & oriented x 3, cranial nerves grossly intact. moves all 4 extremities w/o difficulty. Affect pleasant.  ECG: Normal sinus rhythm with Q waves in the inferior lead suggestive of having inferior met MI in the past, with nonspecific interventricular conduction delay and nonspecific ST-T changes.  EKGs not much change from prior EKGs.  Results for orders  placed or performed during the hospital encounter of 02/17/18 (from the past 24 hour(s))  Urinalysis, Complete w Microscopic     Status: Abnormal   Collection Time: 02/17/18  6:10 PM  Result Value Ref Range   Color, Urine YELLOW (A) YELLOW  APPearance CLEAR (A) CLEAR   Specific Gravity, Urine 1.045 (H) 1.005 - 1.030   pH 5.0 5.0 - 8.0   Glucose, UA NEGATIVE NEGATIVE mg/dL   Hgb urine dipstick SMALL (A) NEGATIVE   Bilirubin Urine NEGATIVE NEGATIVE   Ketones, ur 20 (A) NEGATIVE mg/dL   Protein, ur NEGATIVE NEGATIVE mg/dL   Nitrite NEGATIVE NEGATIVE   Leukocytes, UA NEGATIVE NEGATIVE   RBC / HPF 0-5 0 - 5 RBC/hpf   WBC, UA 0-5 0 - 5 WBC/hpf   Bacteria, UA NONE SEEN NONE SEEN   Squamous Epithelial / LPF NONE SEEN 0 - 5  Heparin level (unfractionated)     Status: Abnormal   Collection Time: 02/17/18  8:22 PM  Result Value Ref Range   Heparin Unfractionated 0.88 (H) 0.30 - 0.70 IU/mL  Glucose, capillary     Status: Abnormal   Collection Time: 02/17/18  8:58 PM  Result Value Ref Range   Glucose-Capillary 147 (H) 70 - 99 mg/dL  Heparin level (unfractionated)     Status: Abnormal   Collection Time: 02/18/18  4:04 AM  Result Value Ref Range   Heparin Unfractionated 0.79 (H) 0.30 - 0.70 IU/mL  CBC     Status: Abnormal   Collection Time: 02/18/18  4:04 AM  Result Value Ref Range   WBC 10.7 (H) 4.0 - 10.5 K/uL   RBC 4.14 (L) 4.22 - 5.81 MIL/uL   Hemoglobin 13.6 13.0 - 17.0 g/dL   HCT 40.3 39.0 - 52.0 %   MCV 97.3 80.0 - 100.0 fL   MCH 32.9 26.0 - 34.0 pg   MCHC 33.7 30.0 - 36.0 g/dL   RDW 12.3 11.5 - 15.5 %   Platelets 175 150 - 400 K/uL   nRBC 0.0 0.0 - 0.2 %  Glucose, capillary     Status: Abnormal   Collection Time: 02/18/18  7:49 AM  Result Value Ref Range   Glucose-Capillary 163 (H) 70 - 99 mg/dL   Comment 1 Notify RN   Basic metabolic panel     Status: Abnormal   Collection Time: 02/18/18  9:56 AM  Result Value Ref Range   Sodium 137 135 - 145 mmol/L   Potassium 3.5 3.5  - 5.1 mmol/L   Chloride 100 98 - 111 mmol/L   CO2 27 22 - 32 mmol/L   Glucose, Bld 240 (H) 70 - 99 mg/dL   BUN 17 8 - 23 mg/dL   Creatinine, Ser 1.13 0.61 - 1.24 mg/dL   Calcium 8.8 (L) 8.9 - 10.3 mg/dL   GFR calc non Af Amer >60 >60 mL/min   GFR calc Af Amer >60 >60 mL/min   Anion gap 10 5 - 15  Magnesium     Status: Abnormal   Collection Time: 02/18/18  9:56 AM  Result Value Ref Range   Magnesium 1.5 (L) 1.7 - 2.4 mg/dL  Glucose, capillary     Status: Abnormal   Collection Time: 02/18/18 11:50 AM  Result Value Ref Range   Glucose-Capillary 196 (H) 70 - 99 mg/dL   Comment 1 Notify RN   Glucose, capillary     Status: Abnormal   Collection Time: 02/18/18  2:10 PM  Result Value Ref Range   Glucose-Capillary 186 (H) 70 - 99 mg/dL  Heparin level (unfractionated)     Status: None   Collection Time: 02/18/18  3:45 PM  Result Value Ref Range   Heparin Unfractionated 0.66 0.30 - 0.70 IU/mL  Troponin I  Status: Abnormal   Collection Time: 02/18/18  3:45 PM  Result Value Ref Range   Troponin I 0.06 (HH) <0.03 ng/mL  Glucose, capillary     Status: Abnormal   Collection Time: 02/18/18  5:36 PM  Result Value Ref Range   Glucose-Capillary 127 (H) 70 - 99 mg/dL   Comment 1 Notify RN    Comment 2 Document in Chart    Ct Angio Ao+bifem W & Or Wo Contrast  Result Date: 02/17/2018 CLINICAL DATA:  77 y.o. male the past medical history of AAA status post repair, diabetes, intermittent claudication, presents to the emergency department for lower extremity weakness. According to the patient he states he fell 2 days ago, was having some right hip pain but states right hip pain has since gone away. This morning he got up out of bed and felt that he could not walk on his legs. States they both felt very weak and he was unable to get up. He states normally he ambulates without any assistance although has a walker but states he rarely uses it. Patient does state pain in the right lower extremity near  his foot. Largely negative review of systems otherwise. EXAM: CT ANGIOGRAPHY AOBIFEM WITHOUT AND WITH CONTRAST TECHNIQUE: CT imaging performed through the abdomen and pelvis and continued through the lower extremities during and following the dynamic enhancement of intravenous contrast with coronal and sagittal MPR and MIP reformatted images also acquired. CONTRAST:  1100m ISOVUE-370 IOPAMIDOL (ISOVUE-370) INJECTION 76% COMPARISON:  04/04/2014 FINDINGS: CTA FINDINGS Aorta: Native aorta distended to a maximum of 3.8 x 3.3 cm. Aneurysm has been excluded with a stent. Stent proximal aspect lies just below the origins of the renal arteries. It extends to the mid aorta where 1 of the 2 limbs is occluded. The patent limb below the bifurcation. There is significant mural thrombus that begins in the aorta above the stent becoming thicker and circumferential within the stent, where the lumen is narrowed to 13 mm. There is no evidence of aortic rupture or of an endoleak. Celiac axis: Partly calcified plaque at the origin. No hemodynamically significant stenosis. SMA: Partly calcified plaque at the origin.  No stenosis. Renal arteries: Dual renal arteries on the left. There is also a small accessory branch on the right to the upper pole. Mild plaque noted at the origin of the renal arteries with no hemodynamically significant stenosis. IMA: Occluded. Common, internal and external iliac arteries: Right common iliac artery is occluded. There is a metal vascular coil at the bifurcation of the right common iliac. External iliac artery on the right is partly reconstituted distally just above the common femoral artery. Right internal iliac artery is reconstituted in the inferior pelvis. There is dilation of the left common iliac artery to 18 mm. A stent extends through the left external iliac artery. No significant stenosis of the common iliac artery. Internal iliac artery on the left is occluded but reconstituted distally. External  iliac artery on the left shows atherosclerotic plaque without significant stenosis. Right lower extremity: Common iliac artery is severely narrowed. There is an occluded by femoral graft crossing the subcutaneous soft tissues just above the pubic symphysis. Significant plaque noted throughout a moderate to severely narrowed femoral artery. Popliteal artery is occluded. No visible enhancement of the trifurcation vessels or of arteries in the right foot. Left lower extremity: Atherosclerosis of the common femoral artery with no hemodynamically significant stenosis. Femoral artery occluded at its origin. Atherosclerotic plaque noted along the deep femoral artery  without significant stenosis. This provides collateralized flow to the popliteal artery which shows atherosclerotic narrowing, but no occlusion. Flow is seen into the trifurcation vessels, most prominently the perineal artery with minimal flow noted to the anterior tibial artery. Posterior tibial artery is partly reconstituted above the ankle, occluded proximally. Minimal flow is seen into the left foot. NON CTA FINDINGS Lung bases: No acute findings. Hepatic biliary: Gallbladder is distended with wall thickening and mild adjacent inflammatory haziness. No discrete gallstones. No bile duct dilation. Liver is unremarkable. Pancreas: No mass or inflammation. Spleen: Normal in size.  No mass or focal lesion. Adrenal glands: Mixed attenuation left adrenal mass measuring 2.9 x 2.1 x 2.6 cm with small areas of macroscopic fat and small calcifications. This is unchanged in size from the prior CT. Normal right adrenal gland. Kidneys, ureters, bladder: Symmetric renal enhancement. Kidneys normal in size orientation and position. Low-density bilobed mass mid to lower pole of the right kidney, consistent with a cyst. Subcentimeter low-density mass, left lower pole anterior cortex consistent with a cyst. These are similar to the prior CT. No stones or hydronephrosis.  Normal ureters. Normal bladder. Gastrointestinal: Stomach is unremarkable. No bowel dilation, wall thickening or inflammation. Normal appendix visualized. Rectum moderately distended with stool. Lymphatic: No pathologically enlarged lymph nodes. Other: No ascites.  No hernia. Musculoskeletal: No fracture or acute finding. No osteoblastic or osteolytic lesions. Review of the MIP images confirms the above findings. IMPRESSION: CTA 1. Status post aortic aneurysm repair. No evidence of aneurysm rupture or of an endoleak. 2. Significant mural thrombus within the abdominal aorta at the level of the stent, without hemodynamically significant stenosis. 3. Chronically occluded right common iliac artery. Thrombosis bifemoral graft. 4. Occluded left femoral artery. Popliteal and calf vessels are reconstituted from the deep left femoral artery. 5. Significant atherosclerotic narrowing of the right common femoral artery and femoral artery with occlusion of the popliteal artery and trifurcation vessels. NON CTA FINDINGS 1. Gallbladder wall distension all wall thickening and adjacent inflammation suggests acute cholecystitis in the proper clinical setting. Consider follow-up assessment with right upper quadrant ultrasound. 2. No other acute abnormality within the abdomen or pelvis. 3. 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 Electronically Signed   By: Lajean Manes M.D.   On: 02/17/2018 12:43     ASSESSMENT AND PLAN: Status post sustained ventricular tachycardia with prior history of inferior wall myocardial infarction and mildly elevated troponin about 0.06.  Echocardiogram was suboptimal study due to COPD.  Wall motion was not able to be assessed.  Left ventricular ejection fraction was 25% by calculation initially without contrast but with contrast left ventricular systolic function appears to be normal.  There is fibro-calcified aortic valve without any significant  aortic stenosis.  Advised continuation of IV amiodarone and check electrolytes including magnesium and potassium in the morning.  At some point patient will need work-up for coronary artery disease and possible catheterization prior to discharge.  Currently remains hemodynamically stable.  Tawny Raspberry A

## 2018-02-19 DIAGNOSIS — R001 Bradycardia, unspecified: Secondary | ICD-10-CM

## 2018-02-19 LAB — HEPARIN LEVEL (UNFRACTIONATED)
Heparin Unfractionated: 0.54 IU/mL (ref 0.30–0.70)
Heparin Unfractionated: 0.42 IU/mL (ref 0.30–0.70)

## 2018-02-19 LAB — COMPREHENSIVE METABOLIC PANEL
ALBUMIN: 3.4 g/dL — AB (ref 3.5–5.0)
ALK PHOS: 65 U/L (ref 38–126)
ALT: 22 U/L (ref 0–44)
ANION GAP: 5 (ref 5–15)
AST: 40 U/L (ref 15–41)
BUN: 17 mg/dL (ref 8–23)
CALCIUM: 9.2 mg/dL (ref 8.9–10.3)
CHLORIDE: 107 mmol/L (ref 98–111)
CO2: 26 mmol/L (ref 22–32)
Creatinine, Ser: 1.01 mg/dL (ref 0.61–1.24)
GFR calc non Af Amer: >60 mL/min (ref >60)
GLUCOSE: 92 mg/dL (ref 70–99)
POTASSIUM: 3.8 mmol/L (ref 3.5–5.1)
SODIUM: 138 mmol/L (ref 135–145)
Total Bilirubin: 0.6 mg/dL (ref 0.3–1.2)
Total Protein: 6.3 g/dL — ABNORMAL LOW (ref 6.5–8.1)

## 2018-02-19 LAB — TROPONIN I: TROPONIN I: 0.08 ng/mL — AB (ref <0.03)

## 2018-02-19 LAB — BASIC METABOLIC PANEL
ANION GAP: 9 (ref 5–15)
BUN: 18 mg/dL (ref 8–23)
CHLORIDE: 106 mmol/L (ref 98–111)
CO2: 24 mmol/L (ref 22–32)
Calcium: 9 mg/dL (ref 8.9–10.3)
Creatinine, Ser: 1.03 mg/dL (ref 0.61–1.24)
GFR calc Af Amer: >60 mL/min (ref >60)
GFR calc non Af Amer: >60 mL/min (ref >60)
Glucose, Bld: 147 mg/dL — ABNORMAL HIGH (ref 70–99)
POTASSIUM: 3.9 mmol/L (ref 3.5–5.1)
SODIUM: 139 mmol/L (ref 135–145)

## 2018-02-19 LAB — GLUCOSE, CAPILLARY
GLUCOSE-CAPILLARY: 139 mg/dL — AB (ref 70–99)
GLUCOSE-CAPILLARY: 149 mg/dL — AB (ref 70–99)
Glucose-Capillary: 133 mg/dL — ABNORMAL HIGH (ref 70–99)
Glucose-Capillary: 163 mg/dL — ABNORMAL HIGH (ref 70–99)

## 2018-02-19 LAB — MAGNESIUM
MAGNESIUM: 1.7 mg/dL (ref 1.7–2.4)
Magnesium: 2 mg/dL (ref 1.7–2.4)

## 2018-02-19 LAB — MRSA PCR SCREENING: MRSA BY PCR: NEGATIVE

## 2018-02-19 NOTE — Progress Notes (Signed)
[employer] [facility] - [place] [facility]   PATIENT NAME: Kurt [name]    MR#:  [id number]  DATE OF BIRTH:  [birth year]  SUBJECTIVE:  CHIEF COMPLAINT:   Chief Complaint  Patient presents with  . Fall  . Weakness   Came with complaints of claudication on the right lower extremity and noted to have thrombosis.  Status post angioplasty. He had sustained V. tach and transferred to ICU, with amiodarone drip now in normal sinus rhythm and stable.  Also on heparin drip.  REVIEW OF SYSTEMS:  CONSTITUTIONAL: No fever, fatigue or weakness.  EYES: No blurred or double vision.  EARS, NOSE, AND THROAT: No tinnitus or ear pain.  RESPIRATORY: No cough, shortness of breath, wheezing or hemoptysis.  CARDIOVASCULAR: No chest pain, orthopnea, edema.  Had palpitation feeling with sustained V. tach episode. GASTROINTESTINAL: No nausea, vomiting, diarrhea or abdominal pain.  GENITOURINARY: No dysuria, hematuria.  ENDOCRINE: No polyuria, nocturia,  HEMATOLOGY: No anemia, easy bruising or bleeding SKIN: No rash or lesion. MUSCULOSKELETAL: No joint pain or arthritis.   NEUROLOGIC: No tingling, numbness, weakness.  PSYCHIATRY: No anxiety or depression.   ROS  DRUG ALLERGIES:  No Known Allergies  VITALS:  Blood pressure (!) 149/76, pulse 80, temperature 98.2 F (36.8 C), temperature source Oral, resp. rate 16, height 6\' 2"  (1.88 m), weight 83 kg, SpO2 98 %.  PHYSICAL EXAMINATION:  GENERAL:  77 y.o.-year-old patient lying in the bed with no acute distress.  EYES: Pupils equal, round, reactive to light and accommodation. No scleral icterus. Extraocular muscles intact.  HEENT: Head atraumatic, normocephalic. Oropharynx and nasopharynx clear.  NECK:  Supple, no jugular venous distention. No thyroid enlargement, no tenderness.  LUNGS: Normal breath sounds bilaterally, no wheezing, rales,rhonchi or crepitation. No use of accessory muscles of respiration.  CARDIOVASCULAR: S1, S2 normal , No  murmurs, rubs, or gallops.  ABDOMEN: Soft, nontender, nondistended. Bowel sounds present. No organomegaly or mass.  EXTREMITIES: No pedal edema, cyanosis, or clubbing.   NEUROLOGIC: Cranial nerves II through XII are intact. Muscle strength 5/5 in all extremities. Sensation intact. Gait not checked.  PSYCHIATRIC: The patient is alert and oriented x 3.   SKIN: No obvious rash, lesion, or ulcer.   Physical Exam LABORATORY PANEL:   CBC Recent Labs  Lab 02/18/18 2324  WBC 8.5  HGB 12.1*  HCT 35.5*  PLT 184   ------------------------------------------------------------------------------------------------------------------  Chemistries  Recent Labs  Lab 02/19/18 1830  NA 138  K 3.8  CL 107  CO2 26  GLUCOSE 92  BUN 17  CREATININE 1.01  CALCIUM 9.2  MG 1.7  AST 40  ALT 22  ALKPHOS 65  BILITOT 0.6   ------------------------------------------------------------------------------------------------------------------  Cardiac Enzymes Recent Labs  Lab 02/18/18 1842 02/18/18 2324  TROPONINI 0.07* 0.08*   ------------------------------------------------------------------------------------------------------------------  RADIOLOGY:  No results found.  ASSESSMENT AND PLAN:   Active Problems:   Ischemic foot   Pressure injury of skin  *Sustained ventricular tachycardia Magnesium level is low-replace IV. Potassium is 3.5, will replace and maintain around 4. Follow troponin and echocardiogram. Given loading dose of IV amiodarone and started on amiodarone drip. Transferred to ICU. Discussed with ICU physician, vascular surgeon, cardiologist. Will need cardiac catheterization as per cardiologist, continue heparin drip for now.  *  Ischemic right leg-referral vascular disease -IV heparin drip, aspirin, Plavix -Dr. Wyn Quaker helped  revascularization for limb salvage. CT angiogram shows occlusion of femoral to femoral bypass on arrival. -PRN pain meds Continue anticoagulation  per vascular for now.  *  Diabetes with complications of peripheral arterial disease -hold metformin given CT with dye -sliding scale insulin -continue amaryl  * COPD with ongoing tobacco abuse -incentive spirometer PRN nebs  * Heavy tobacco abuse patient advised smoking cessation. Patient not motivated to do so -will provide nicotine patch  * DVT prophylaxis already on heparin drip    All the records are reviewed and case discussed with Care Management/Social Workerr. Management plans discussed with the patient, family and they are in agreement.  CODE STATUS: Full code  TOTAL TIME TAKING CARE OF THIS PATIENT: 35 minutes.     POSSIBLE D/C IN 2-3 DAYS, DEPENDING ON CLINICAL CONDITION.   Altamese Dilling M.D on 02/19/2018   Between 7am to 6pm - Pager - [phone]  After 6pm go to [email or web address] - password [email or web address]  [facility]  Office  [phone]  CC: Primary care physician; Jaclyn Shaggy, MD  Note: This dictation was prepared with Dragon dictation along with smaller phrase technology. Any transcriptional errors that result from this process are unintentional.

## 2018-02-19 NOTE — Consult Note (Addendum)
ANTICOAGULATION CONSULT NOTE - Follow up consult  Pharmacy Consult for heparin infusion Indication: ischemia of RLE  No Known Allergies  Patient Measurements: Height: 6\' 2"  (188 cm) Weight: 183 lb (83 kg) IBW/kg (Calculated) : 82.2 Heparin Dosing Weight: 83  Vital Signs: Temp: 98.1 F (36.7 C) (10/28 0200) Temp Source: Oral (10/28 0200) BP: 138/68 (10/28 0600) Pulse Rate: 70 (10/28 0600)  Labs: Recent Labs    02/17/18 0934 02/17/18 1511  02/18/18 0404 02/18/18 0956 02/18/18 1545 02/18/18 1842 02/18/18 2324 02/19/18 0602  HGB 16.0  --   --  13.6  --   --   --  12.1*  --   HCT 47.8  --   --  40.3  --   --   --  35.5*  --   PLT 225  --   --  175  --   --   --  184  --   APTT  --  124*  --   --   --   --   --   --   --   LABPROT  --  14.7  15.1  --   --   --   --   --   --   --   INR  --  1.16  1.20  --   --   --   --   --   --   --   HEPARINUNFRC  --   --    < > 0.79*  --  0.66  --  0.54 0.42  CREATININE 1.09  --   --   --  1.13  --  1.18  --   --   TROPONINI  --   --   --   --   --  0.06* 0.07* 0.08*  --    < > = values in this interval not displayed.    Estimated Creatinine Clearance: 62.9 mL/min (by C-G formula based on SCr of 1.18 mg/dL).   Medical History: Past Medical History:  Diagnosis Date  . AAA (abdominal aortic aneurysm) ([facility])   . Diabetes mellitus without complication ([facility])     Medications:    Assessment: 77 y.o. male the past medical history of AAA status post repair, diabetes, intermittent claudication, presents to the emergency department for lower extremity weakness.  Goal of Therapy:  Heparin level 0.3-0.7 units/ml Monitor platelets by anticoagulation protocol: Yes   Plan:  10/28 @ 0600 HL 0.42 therapeutic. Will continue current rate and will recheck w/ am labs.  Thomasene Ripple, PharmD, BCPS Clinical Pharmacist 02/19/2018

## 2018-02-19 NOTE — Care Management Note (Signed)
Case Management Note  Patient Details  Name: Kurt [name] MRN: [id number] Date of Birth: Jun 12, 1940  Subjective/Objective:       Patient admitted to the hospital for  RLE ischemia.  He is now s/p right femoral thrombo-endarterectomy, graft and profunda femoris artery embolectomy.   Patient is currently in the ICU.  He lives at home and has a friend that lives with him- he reports she is a former Engineer, civil (consulting) and she has been helping him.  His sister Kurt [name] is at the bedside and lives in [place], he lives in [place].  He is able to drive.  He states that he has 4 canes at home and a couple of walkers, per his report he does not use them often he also has a shower chair with a back.  He has fallen recently while going to get the mail- he was not using a cane or walker when this happened.  Verified PCP as Dr. Dewaine [name], reports he last saw him about 4 months ago and typically sees him 3 times per year.  He uses [facility] in [place] and has no barriers to obtaining prescription medications.  RNCM discussed the possible need for home health upon discharge.  The patient asked did he have to have HH and I said no it was his choice.  RNCM gave him a list of HH agencies to choose from and will follow up at a later time to see if he is agreeable to [facility] being set up.                 Action/Plan:   Expected Discharge Date:  02/19/18               Expected Discharge Plan:     In-House Referral:     Discharge planning Services  CM Consult  Post Acute Care Choice:    Choice offered to:     DME Arranged:    DME Agency:     HH Arranged:    HH Agency:     Status of Service:  In process, will continue to follow  If discussed at Long Length of Stay Meetings, dates discussed:    Additional Comments:  Allayne Butcher, RN 02/19/2018, 11:50 AM

## 2018-02-19 NOTE — Progress Notes (Signed)
[facility]  Daily Progress Note   Subjective  - 2 Days Post-Op  No lower extremity complaints.  No further cardiac issues and now transitioning to oral agents.  Appears to be off of amiodarone drip  Objective Vitals:   02/19/18 0500 02/19/18 0600 02/19/18 0700 02/19/18 0900  BP: 127/66 138/68 (!) 156/82   Pulse: 68 70 71   Resp: 14 17 12    Temp:    98.2 F (36.8 C)  TempSrc:    Oral  SpO2: 95% 96% 98%   Weight:      Height:        Intake/Output Summary (Last 24 hours) at 02/19/2018 1619 Last data filed at 02/19/2018 1421 Gross per 24 hour  Intake 1065.66 ml  Output 1350 ml  Net -284.34 ml    PULM  CTAB CV  RRR VASC  both feet are warm with excellent capillary refill.  Laboratory CBC    Component Value Date/Time   WBC 8.5 02/18/2018 2324   HGB 12.1 (L) 02/18/2018 2324   HGB 13.7 05/17/2014 0529   HCT 35.5 (L) 02/18/2018 2324   HCT 39.9 (L) 05/17/2014 0529   PLT 184 02/18/2018 2324   PLT 149 (L) 05/17/2014 0529    BMET    Component Value Date/Time   NA 139 02/19/2018 0602   NA 139 05/17/2014 0529   K 3.9 02/19/2018 0602   K 3.6 05/17/2014 0529   CL 106 02/19/2018 0602   CL 105 05/17/2014 0529   CO2 24 02/19/2018 0602   CO2 27 05/17/2014 0529   GLUCOSE 147 (H) 02/19/2018 0602   GLUCOSE 113 (H) 05/17/2014 0529   BUN 18 02/19/2018 0602   BUN 8 05/17/2014 0529   CREATININE 1.03 02/19/2018 0602   CREATININE 0.95 05/17/2014 0529   CALCIUM 9.0 02/19/2018 0602   CALCIUM 8.7 05/17/2014 0529   GFRNONAA >60 02/19/2018 0602   GFRNONAA >60 05/17/2014 0529   GFRAA >60 02/19/2018 0602   GFRAA >60 05/17/2014 0529    Assessment/Planning: POD #2 s/p right lower extremity revascularization for 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  If cardiac catheterization is performed, I would strongly consider a radial approach.  Right femoral approach should not be performed and a left femoral approach is also somewhat risky with his femoral to femoral bypass and recent intervention.     [name]  02/19/2018, 4:19 PM

## 2018-02-19 NOTE — Consult Note (Signed)
ANTICOAGULATION CONSULT NOTE - Follow up consult  Pharmacy Consult for heparin infusion Indication: ischemia of RLE  No Known Allergies  Patient Measurements: Height: 6\' 2"  (188 cm) Weight: 183 lb (83 kg) IBW/kg (Calculated) : 82.2 Heparin Dosing Weight: 83  Vital Signs: Temp: 97.8 F (36.6 C) (10/27 2000) Temp Source: Oral (10/27 2000) BP: 142/73 (10/28 0000) Pulse Rate: 69 (10/28 0000)  Labs: Recent Labs    02/17/18 0934 02/17/18 1511  02/18/18 0404 02/18/18 0956 02/18/18 1545 02/18/18 1842 02/18/18 2324  HGB 16.0  --   --  13.6  --   --   --  12.1*  HCT 47.8  --   --  40.3  --   --   --  35.5*  PLT 225  --   --  175  --   --   --  184  APTT  --  124*  --   --   --   --   --   --   LABPROT  --  14.7  15.1  --   --   --   --   --   --   INR  --  1.16  1.20  --   --   --   --   --   --   HEPARINUNFRC  --   --    < > 0.79*  --  0.66  --  0.54  CREATININE 1.09  --   --   --  1.13  --  1.18  --   TROPONINI  --   --   --   --   --  0.06* 0.07* 0.08*   < > = values in this interval not displayed.    Estimated Creatinine Clearance: 62.9 mL/min (by C-G formula based on SCr of 1.18 mg/dL).   Medical History: Past Medical History:  Diagnosis Date  . AAA (abdominal aortic aneurysm) (HCC)   . Diabetes mellitus without complication (HCC)     Medications:    Assessment: 77 y.o. male the past medical history of AAA status post repair, diabetes, intermittent claudication, presents to the emergency department for lower extremity weakness.  Goal of Therapy:  Heparin level 0.3-0.7 units/ml Monitor platelets by anticoagulation protocol: Yes   Plan:  10/28 @ 0500 HL 0.54 therapeutic. Will continue current rate and will recheck w/ am labs.  Thomasene Ripple, PharmD, BCPS Clinical Pharmacist 02/19/2018

## 2018-02-19 NOTE — Progress Notes (Signed)
Name: Kurt [name] MRN: [id number] DOB: 02/05/1941     CONSULTATION DATE: 02/17/2018  Subjective & objective: No major events last night remains on heparin and amiodarone drip.  PAST MEDICAL HISTORY :   has a past medical history of AAA (abdominal aortic aneurysm) (HCC) and Diabetes mellitus without complication (HCC).  has a past surgical history that includes arm surgery (Left); Abdominal aortic aneurysm repair; Thrombectomy femoral artery (Right, 02/17/2018); Embolectomy (Right, 02/17/2018); and Femoral-femoral Bypass Graft (Right, 02/17/2018). Prior to Admission medications   Medication Sig Start Date End Date Taking? Authorizing Provider  aspirin EC 81 MG tablet Take 81 mg by mouth daily.    Yes [provider]  Cholecalciferol (D-5000) 5000 units TABS Take 5,000 Units by mouth daily.    Yes [provider]  clopidogrel (PLAVIX) 75 MG tablet Take 75 mg by mouth daily.    Yes [provider]  co-enzyme Q-10 30 MG capsule Take 30 mg by mouth 3 (three) times daily.   Yes [provider]  Coconut Oil 1000 MG CAPS Take 1,000 mg by mouth as needed (dry skin).    Yes [provider]  docusate sodium (COLACE) 100 MG capsule Take 100 mg by mouth 2 (two) times daily.   Yes [provider]  donepezil (ARICEPT) 5 MG tablet Take 5 mg by mouth at bedtime.  02/15/17  Yes [provider]  gabapentin (NEURONTIN) 300 MG capsule Take 300 mg by mouth daily as needed (Pain).  03/15/16  Yes [provider]  GARLIC OIL PO Take 1 capsule by mouth daily as needed (cholesterol).    Yes [provider]  glimepiride (AMARYL) 2 MG tablet Take 2 mg by mouth daily with breakfast.  03/14/16  Yes [provider]  metFORMIN (GLUCOPHAGE) 1000 MG tablet Take 1,000 mg by mouth 2 (two) times daily with a meal.  02/18/16  Yes [provider]  Misc Natural Products (PROSTATE THERAPY COMPLEX PO) Take 562 mg by mouth daily.     Yes [provider]  Red Yeast Rice 600 MG CAPS Take 1 capsule by mouth daily.    Yes [provider]  traMADol (ULTRAM) 50 MG tablet Take 50 mg by mouth every 6 (six) hours as needed for moderate pain.  04/27/16  Yes [provider]  vitamin B-12 (CYANOCOBALAMIN) 1000 MCG tablet Take 1,000 mcg by mouth daily.    Yes [provider]   No Known Allergies  FAMILY HISTORY:  family history includes Alzheimer's disease in his mother; Heart disease in his brother. SOCIAL HISTORY:  reports that he has been smoking. He uses smokeless tobacco. He reports that he drank alcohol. He reports that he does not use drugs.  REVIEW OF SYSTEMS:   Unable to obtain due to critical illness   VITAL SIGNS: Temp:  [97.8 F (36.6 C)-98.2 F (36.8 C)] 98.2 F (36.8 C) (10/28 0900) Pulse Rate:  [66-91] 71 (10/28 0700) Resp:  [12-20] 12 (10/28 0700) BP: (85-156)/(50-111) 156/82 (10/28 0700) SpO2:  [95 %-100 %] 98 % (10/28 0700)  Physical Examination:  Awake and oriented with no focal neurological deficits On room air, no distress, able to talk in full sentences, bilateral equal air entry and no adventitious sounds S1 and S2 are audible with no murmur Benign abdominal exam with normal processes Wound VAC right groin status post right femoral thromboendarterectomy.  Good peripheral vascularity and capillary refill   ASSESSMENT / PLAN: Sustained VT. Baseline CAD, right bundle branch block.  ECHO on 02/18/2018 LVEF 25% - Normal, grade 1 diastolic dysfunction. -Amiodarone drip + Heparin gtt -Aspirin +  Plavix + BB -EKG and cardiac enzymes -Management as per cardiology  POD #2 status post right femoral thromboendarterectomy.  On heparin drip -Management as per vascular team  COPD -Optimize bronchodilators  Diabetes mellitus -Glycemic control  Full code  Supportive care  Critical care time 35 minutes

## 2018-02-19 NOTE — Progress Notes (Signed)
Pharmacy Electrolyte Monitoring Consult:  Pharmacy consulted to assist in monitoring and replacing electrolytes in this 77 y.o. male admitted on 02/17/2018 with ischemic foot. Patient admitted to ICU with ventricular tachycardia after rapid response called on 10/27.   Labs:  Sodium (mmol/L)  Date Value  02/19/2018 139  05/17/2014 139   Potassium (mmol/L)  Date Value  02/19/2018 3.9  05/17/2014 3.6   Magnesium (mg/dL)  Date Value  16/01/9603 2.0  05/15/2014 1.6 (L)   Phosphorus (mg/dL)  Date Value  54/12/8117 2.6  05/15/2014 2.1 (L)   Calcium (mg/dL)  Date Value  14/78/2956 9.0   Calcium, Total (mg/dL)  Date Value  21/30/8657 8.7   Albumin (g/dL)  Date Value  84/69/6295 3.1 (L)    Assessment/Plan: Will defer replacement at this time.   Will recheck all electrolytes with am labs.   Will replace for goal potassium ~ 4 and goal magnesium ~ 2.   Pharmacy will continue to monitor and adjust per consult.   Simpson,Michael L 02/19/2018 2:40 PM

## 2018-02-20 LAB — CBC
HCT: 38.2 % — ABNORMAL LOW (ref 39.0–52.0)
HEMOGLOBIN: 13 g/dL (ref 13.0–17.0)
MCH: 32.8 pg (ref 26.0–34.0)
MCHC: 34 g/dL (ref 30.0–36.0)
MCV: 96.5 fL (ref 80.0–100.0)
Platelets: 189 10*3/uL (ref 150–400)
RBC: 3.96 MIL/uL — ABNORMAL LOW (ref 4.22–5.81)
RDW: 12.4 % (ref 11.5–15.5)
WBC: 6.7 10*3/uL (ref 4.0–10.5)
nRBC: 0 % (ref 0.0–0.2)

## 2018-02-20 LAB — BASIC METABOLIC PANEL
Anion gap: 11 (ref 5–15)
BUN: 15 mg/dL (ref 8–23)
CALCIUM: 9.1 mg/dL (ref 8.9–10.3)
CO2: 27 mmol/L (ref 22–32)
CREATININE: 1.1 mg/dL (ref 0.61–1.24)
Chloride: 106 mmol/L (ref 98–111)
GFR calc Af Amer: >60 mL/min (ref >60)
GFR calc non Af Amer: >60 mL/min (ref >60)
GLUCOSE: 141 mg/dL — AB (ref 70–99)
Potassium: 4.2 mmol/L (ref 3.5–5.1)
Sodium: 144 mmol/L (ref 135–145)

## 2018-02-20 LAB — HEPARIN LEVEL (UNFRACTIONATED): HEPARIN UNFRACTIONATED: 0.43 [IU]/mL (ref 0.30–0.70)

## 2018-02-20 LAB — MAGNESIUM
MAGNESIUM: 1.7 mg/dL (ref 1.7–2.4)
MAGNESIUM: 2.1 mg/dL (ref 1.7–2.4)

## 2018-02-20 LAB — PHOSPHORUS: PHOSPHORUS: 2.9 mg/dL (ref 2.5–4.6)

## 2018-02-20 LAB — GLUCOSE, CAPILLARY
Glucose-Capillary: 243 mg/dL — ABNORMAL HIGH (ref 70–99)
Glucose-Capillary: 178 mg/dL — ABNORMAL HIGH (ref 70–99)
Glucose-Capillary: 162 mg/dL — ABNORMAL HIGH (ref 70–99)
Glucose-Capillary: 101 mg/dL — ABNORMAL HIGH (ref 70–99)

## 2018-02-20 LAB — SURGICAL PATHOLOGY

## 2018-02-20 MED ORDER — MAGNESIUM SULFATE 2 GM/50ML IV SOLN
2.0000 g | Freq: Once | INTRAVENOUS | Status: AC
  Administered 2018-02-20: 2 g via INTRAVENOUS
  Filled 2018-02-20: qty 50

## 2018-02-20 MED ORDER — TRIAMCINOLONE ACETONIDE 0.1 % EX CREA
TOPICAL_CREAM | Freq: Two times a day (BID) | CUTANEOUS | Status: DC
  Administered 2018-02-20: 09:00:00 via TOPICAL
  Administered 2018-02-20: 1 via TOPICAL
  Administered 2018-02-21 – 2018-02-23 (×5): via TOPICAL
  Filled 2018-02-20: qty 15

## 2018-02-20 NOTE — Progress Notes (Signed)
Patient had run of Dennard Schaumann, NP aware. Magnesium ordered currently and being infused. Trudee Kuster

## 2018-02-20 NOTE — Progress Notes (Signed)
Name: Kurt [name] MRN: [id number] DOB: 21-Jan-1941     CONSULTATION DATE: 02/17/2018 Subjective & objectives: Tolerating room air, off amiodarone drip and remains on heparin drip  PAST MEDICAL HISTORY :   has a past medical history of AAA (abdominal aortic aneurysm) (HCC) and Diabetes mellitus without complication (HCC).  has a past surgical history that includes arm surgery (Left); Abdominal aortic aneurysm repair; Thrombectomy femoral artery (Right, 02/17/2018); Embolectomy (Right, 02/17/2018); and Femoral-femoral Bypass Graft (Right, 02/17/2018). Prior to Admission medications   Medication Sig Start Date End Date Taking? Authorizing Provider  aspirin EC 81 MG tablet Take 81 mg by mouth daily.    Yes [provider]  Cholecalciferol (D-5000) 5000 units TABS Take 5,000 Units by mouth daily.    Yes [provider]  clopidogrel (PLAVIX) 75 MG tablet Take 75 mg by mouth daily.    Yes [provider]  co-enzyme Q-10 30 MG capsule Take 30 mg by mouth 3 (three) times daily.   Yes [provider]  Coconut Oil 1000 MG CAPS Take 1,000 mg by mouth as needed (dry skin).    Yes [provider]  docusate sodium (COLACE) 100 MG capsule Take 100 mg by mouth 2 (two) times daily.   Yes [provider]  donepezil (ARICEPT) 5 MG tablet Take 5 mg by mouth at bedtime.  02/15/17  Yes [provider]  gabapentin (NEURONTIN) 300 MG capsule Take 300 mg by mouth daily as needed (Pain).  03/15/16  Yes [provider]  GARLIC OIL PO Take 1 capsule by mouth daily as needed (cholesterol).    Yes [provider]  glimepiride (AMARYL) 2 MG tablet Take 2 mg by mouth daily with breakfast.  03/14/16  Yes [provider]  metFORMIN (GLUCOPHAGE) 1000 MG tablet Take 1,000 mg by mouth 2 (two) times daily with a meal.  02/18/16  Yes [provider]  Misc Natural Products (PROSTATE THERAPY COMPLEX PO) Take 562 mg by mouth daily.     Yes [provider]  Red Yeast Rice 600 MG CAPS Take 1 capsule by mouth daily.    Yes [provider]  traMADol (ULTRAM) 50 MG tablet Take 50 mg by mouth every 6 (six) hours as needed for moderate pain.  04/27/16  Yes [provider]  vitamin B-12 (CYANOCOBALAMIN) 1000 MCG tablet Take 1,000 mcg by mouth daily.    Yes [provider]   No Known Allergies  FAMILY HISTORY:  family history includes Alzheimer's disease in his mother; Heart disease in his brother. SOCIAL HISTORY:  reports that he has been smoking. He uses smokeless tobacco. He reports that he drank alcohol. He reports that he does not use drugs.  REVIEW OF SYSTEMS:   Unable to obtain due to critical illness   VITAL SIGNS: Temp:  [97.9 F (36.6 C)-98.4 F (36.9 C)] 98.2 F (36.8 C) (10/29 0400) Pulse Rate:  [74-85] 78 (10/29 0800) Resp:  [10-22] 11 (10/29 0800) BP: (121-165)/(66-93) 159/73 (10/29 0800) SpO2:  [92 %-100 %] 97 % (10/29 0800)  Physical Examination: Awake and oriented with no focal neurological deficits On room air, no distress, able to talk in full sentences, bilateral equal air entry and no adventitious sounds S1 and S2 are audible with no murmur Benign abdominal exam with normal processes Wound VAC right groin status post right femoral thromboendarterectomy. Good peripheral vascularity and capillary refill   ASSESSMENT / PLAN: Sustained VT.Baseline CAD, right bundle branch block. ECHO on 02/18/2018 LVEF  25% - Normal, grade 1 diastolic dysfunction.  Patient declined cardiac cath -Off Amiodarone drip + on Heparin gtt -Aspirin+Plavix + BB -Management as per cardiology  POD #3 status post right femoral thromboendarterectomy. On heparin drip -Management as per vascular team  COPD -Optimize bronchodilators  Diabetes mellitus -Glycemic control  Full code  Supportive care  Patient wife was updated at the bedside and she agreed to the plan of  care Critical care time 35 minutes

## 2018-02-20 NOTE — Progress Notes (Signed)
Pharmacy Electrolyte Monitoring Consult:  Pharmacy consulted to assist in monitoring and replacing electrolytes in this 77 y.o. male admitted on 02/17/2018 with ischemic foot. Patient admitted to ICU with ventricular tachycardia after rapid response called on 10/27.   Labs:  Sodium (mmol/L)  Date Value  02/20/2018 144  05/17/2014 139   Potassium (mmol/L)  Date Value  02/20/2018 4.2  05/17/2014 3.6   Magnesium (mg/dL)  Date Value  96/07/5407 2.1  05/15/2014 1.6 (L)   Phosphorus (mg/dL)  Date Value  81/19/1478 2.9  05/15/2014 2.1 (L)   Calcium (mg/dL)  Date Value  29/56/2130 9.1   Calcium, Total (mg/dL)  Date Value  86/57/8469 8.7   Albumin (g/dL)  Date Value  62/95/2841 3.4 (L)  05/15/2014 3.1 (L)    Assessment/Plan: Magnesium 2.1. No additional supplement indicated at this time.  Pharmacy will continue to monitor and adjust per consult.   Carola Frost, Pharm.D., BCPS Clinical Pharmacist 02/20/2018 8:21 PM

## 2018-02-20 NOTE — Progress Notes (Signed)
[facility]  SUBJECTIVE: [name] is a 77 year old male with a past medical history significant for AAA s/p repair and peripheral vascular disease. He presented to the ED on 02/17/18 with lower extremity weakness. He underwent a right common femoral artery thromboendarterectomy, embolectomy of the left to right femoral-femoral bypass, and embolectomy of right profunda femoris artery with Dr. Wyn Quaker on 02/17/18.  He had a sustained run of Vtach and was started on IV amiodarone and given magnesium and potassium. Today, patient is sitting up comfortably and denies chest pain, or shortness of breath.  Denies any recurrence of palpitations or tachycardia. We discussed in detail the option of proceeding with a cardiac catheterization to further evaluate elevated troponin levels and episode of Vtach, but patient defers at this time.  We also discussed proceeding with a cath in the outpatient setting, and he voiced understanding.     Vitals:   02/20/18 0500 02/20/18 0600 02/20/18 0700 02/20/18 0800  BP: 121/79 (!) 165/88 (!) 143/70 (!) 159/73  Pulse: 78 75 82 78  Resp: 14 12 13 11   Temp:      TempSrc:      SpO2: 92% 98% 95% 97%  Weight:      Height:         Intake/Output Summary (Last 24 hours) at 02/20/2018 1117 Last data filed at 02/20/2018 1100 Gross per 24 hour  Intake 519.95 ml  Output 2550 ml  Net -2030.05 ml      PHYSICAL EXAM  General: Well developed, well nourished, in no acute distress HEENT:  Normocephalic and atramatic Neck:  No JVD.  Lungs: Clear bilaterally to auscultation and percussion. Heart: HRRR . Normal S1 and S2 without gallops or murmurs.  Abdomen: Bowel sounds are positive, abdomen soft and non-tender  Msk:  Back normal, normal gait. Normal strength and tone for age. Extremities: No clubbing, cyanosis or edema.   Neuro: Alert and oriented X 3. Psych:  Good affect, responds appropriately   LABS: Basic Metabolic Panel: Recent Labs    02/18/18 1842   02/19/18 1830 02/20/18 0618  NA 137   < > 138 144  K 3.8   < > 3.8 4.2  CL 102   < > 107 106  CO2 26   < > 26 27  GLUCOSE 166*   < > 92 141*  BUN 20   < > 17 15  CREATININE 1.18   < > 1.01 1.10  CALCIUM 8.9   < > 9.2 9.1  MG 2.1   < > 1.7 1.7  PHOS 2.6  --   --  2.9   < > = values in this interval not displayed.   Liver Function Tests: Recent Labs    02/19/18 1830  AST 40  ALT 22  ALKPHOS 65  BILITOT 0.6  PROT 6.3*  ALBUMIN 3.4*   No results for input(s): LIPASE, AMYLASE in the last 72 hours. CBC: Recent Labs    02/18/18 2324 02/20/18 0618  WBC 8.5 6.7  HGB 12.1* 13.0  HCT 35.5* 38.2*  MCV 95.2 96.5  PLT 184 189   Cardiac Enzymes: Recent Labs    02/18/18 1545 02/18/18 1842 02/18/18 2324  TROPONINI 0.06* 0.07* 0.08*   BNP: Invalid input(s): POCBNP D-Dimer: No results for input(s): DDIMER in the last 72 hours. Hemoglobin A1C: No results for input(s): HGBA1C in the last 72 hours. Fasting Lipid Panel: No results for input(s): CHOL, HDL, LDLCALC, TRIG, CHOLHDL, LDLDIRECT in the last 72 hours. Thyroid  Function Tests: No results for input(s): TSH, T4TOTAL, T3FREE, THYROIDAB in the last 72 hours.  Invalid input(s): FREET3 Anemia Panel: No results for input(s): VITAMINB12, FOLATE, FERRITIN, TIBC, IRON, RETICCTPCT in the last 72 hours.  No results found.   Echo: Reportedly poor study due to underlying COPD. LV function appeared normal with contrast. Mild MR.   TELEMETRY:  Sinus rhythm with 1st degree AV block  ASSESSMENT AND PLAN:  Active Problems:   Ischemic foot   Pressure injury of skin    1.  Ventricular tachycardia/elevated troponin   -Discussed option to proceed with cardiac catheterization and patient defers  -Encouraged to consider further workup with outpatient cardiac catheterization   -Continue with Amiodarone drip for now   2.  Neurologic changes-scheduled for brain MRI per neurology as an outpatient.  Patient's caregiver would like  this done as an inpatient.  Andi Hence  PA-C 02/20/2018 11:17 AM

## 2018-02-20 NOTE — Progress Notes (Signed)
[facility] - [place] at [facility]   PATIENT NAME: Kurt [name]    MR#:  [id number]  DATE OF BIRTH:  08-21-1940  SUBJECTIVE:  CHIEF COMPLAINT:   Chief Complaint  Patient presents with  . Fall  . Weakness   Came with complaints of claudication on the right lower extremity and noted to have thrombosis.  Status post angioplasty. He had sustained V. tach and transferred to ICU, with amiodarone drip now in normal sinus rhythm and stable.  Also on heparin drip.  REVIEW OF SYSTEMS:  CONSTITUTIONAL: No fever, fatigue or weakness.  EYES: No blurred or double vision.  EARS, NOSE, AND THROAT: No tinnitus or ear pain.  RESPIRATORY: No cough, shortness of breath, wheezing or hemoptysis.  CARDIOVASCULAR: No chest pain, orthopnea, edema.  Had palpitation feeling with sustained V. tach episode. GASTROINTESTINAL: No nausea, vomiting, diarrhea or abdominal pain.  GENITOURINARY: No dysuria, hematuria.  ENDOCRINE: No polyuria, nocturia,  HEMATOLOGY: No anemia, easy bruising or bleeding SKIN: No rash or lesion. MUSCULOSKELETAL: No joint pain or arthritis.   NEUROLOGIC: No tingling, numbness, weakness.  PSYCHIATRY: No anxiety or depression.   ROS  DRUG ALLERGIES:  No Known Allergies  VITALS:  Blood pressure 135/83, pulse 84, temperature 98.5 F (36.9 C), temperature source Oral, resp. rate 18, height 6\' 3"  (1.905 m), weight 83.4 kg, SpO2 98 %.  PHYSICAL EXAMINATION:  GENERAL:  77 y.o.-year-old patient lying in the bed with no acute distress.  EYES: Pupils equal, round, reactive to light and accommodation. No scleral icterus. Extraocular muscles intact.  HEENT: Head atraumatic, normocephalic. Oropharynx and nasopharynx clear.  NECK:  Supple, no jugular venous distention. No thyroid enlargement, no tenderness.  LUNGS: Normal breath sounds bilaterally, no wheezing, rales,rhonchi or crepitation. No use of accessory muscles of respiration.  CARDIOVASCULAR: S1, S2 normal , No  murmurs, rubs, or gallops.  ABDOMEN: Soft, nontender, nondistended. Bowel sounds present. No organomegaly or mass.  EXTREMITIES: No pedal edema, cyanosis, or clubbing.   NEUROLOGIC: Cranial nerves II through XII are intact. Muscle strength 5/5 in all extremities. Sensation intact. Gait not checked.  PSYCHIATRIC: The patient is alert and oriented x 3.   SKIN: No obvious rash, lesion, or ulcer.   Physical Exam LABORATORY PANEL:   CBC Recent Labs  Lab 02/20/18 0618  WBC 6.7  HGB 13.0  HCT 38.2*  PLT 189   ------------------------------------------------------------------------------------------------------------------  Chemistries  Recent Labs  Lab 02/19/18 1830 02/20/18 0618 02/20/18 1933  NA 138 144  --   K 3.8 4.2  --   CL 107 106  --   CO2 26 27  --   GLUCOSE 92 141*  --   BUN 17 15  --   CREATININE 1.01 1.10  --   CALCIUM 9.2 9.1  --   MG 1.7 1.7 2.1  AST 40  --   --   ALT 22  --   --   ALKPHOS 65  --   --   BILITOT 0.6  --   --    ------------------------------------------------------------------------------------------------------------------  Cardiac Enzymes Recent Labs  Lab 02/18/18 1842 02/18/18 2324  TROPONINI 0.07* 0.08*   ------------------------------------------------------------------------------------------------------------------  RADIOLOGY:  No results found.  ASSESSMENT AND PLAN:   Active Problems:   Ischemic foot   Pressure injury of skin  *Sustained ventricular tachycardia Magnesium level is low-replace IV. Potassium is 3.5, will replace and maintain around 4. Follow troponin and echocardiogram. Given loading dose of IV amiodarone and started on amiodarone drip. Discussed with ICU  physician, vascular surgeon, cardiologist. Will need cardiac catheterization as per cardiologist, continue heparin drip for now. Pt is refusing for cath.  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 Wyn Quaker helped  revascularization for limb salvage. CT angiogram shows occlusion of femoral to femoral bypass on arrival. -PRN pain meds Continue anticoagulation per vascular for now. - suggest stable to discharge from ascular point. - anticoagulation on d/c is - Eliquis OR xarelto PLUS baby ASA.  *  Diabetes with complications of peripheral arterial disease -hold metformin given CT with dye -sliding scale insulin -continue amaryl  * COPD with ongoing tobacco abuse -incentive spirometer PRN nebs  * Neurological symptoms   As per girl friend- have forgetfullness and recurrent TIA symptoms. Was scheduled to have MRI brain as out pt for next week.   Due to this symptoms, she requested to have it done today as inpatient.  * Heavy tobacco abuse patient advised smoking cessation. Patient not motivated to do so -will provide nicotine patch  * DVT prophylaxis already on heparin drip    All the records are reviewed and case discussed with Care Management/Social Workerr. Management plans discussed with the patient, family and they are in agreement.  CODE STATUS: Full code  TOTAL TIME TAKING CARE OF THIS PATIENT: 35 minutes.     POSSIBLE D/C IN 2-3 DAYS, DEPENDING ON CLINICAL CONDITION.   Altamese Dilling M.D on 02/20/2018   Between 7am to 6pm - Pager - [phone]  After 6pm go to [email or web address] - password [email or web address]  [facility] [place] [facility]  Office  [phone]  CC: Primary care physician; Jaclyn Shaggy, MD  Note: This dictation was prepared with Dragon dictation along with smaller phrase technology. Any transcriptional errors that result from this process are unintentional.

## 2018-02-20 NOTE — Progress Notes (Addendum)
Pharmacy Electrolyte Monitoring Consult:  Pharmacy consulted to assist in monitoring and replacing electrolytes in this 77 y.o. male admitted on 02/17/2018 with ischemic foot. Patient admitted to ICU with ventricular tachycardia after rapid response called on 10/27.   Labs:  Sodium (mmol/L)  Date Value  02/20/2018 144  05/17/2014 139   Potassium (mmol/L)  Date Value  02/20/2018 4.2  05/17/2014 3.6   Magnesium (mg/dL)  Date Value  16/01/9603 1.7  05/15/2014 1.6 (L)   Phosphorus (mg/dL)  Date Value  54/12/8117 2.9  05/15/2014 2.1 (L)   Calcium (mg/dL)  Date Value  14/78/2956 9.1   Calcium, Total (mg/dL)  Date Value  21/30/8657 8.7   Albumin (g/dL)  Date Value  84/69/6295 3.4 (L)  05/15/2014 3.1 (L)    Assessment/Plan: Patient received magnesium 2g IV x 1 this am. No further replacement warranted at this time.   Magnesium checks remain ordered q12hr with next one at 1800. Will recheck all electrolytes with am labs.   Will replace for goal potassium ~ 4 and goal magnesium ~ 2. Ventricular tachycardia on 10/27 thought be in part due to to magnesium level of 1.5.   Pharmacy will continue to monitor and adjust per consult.   Jakaiden Fill L 02/20/2018 11:10 AM

## 2018-02-20 NOTE — Progress Notes (Signed)
PT Cancellation Note  Patient Details Name: TYMARION EVERARD MRN: [id number] DOB: [birth year]   Cancelled Treatment:    Reason Eval/Treat Not Completed: Medical issues which prohibited therapy.  Received PT order after pt transferred to ICU last date (10/28).  Pt with run of vtach this morning and magnesium being infused.  Will hold PT until pt more medically appropriate for exertional activity.    Encarnacion Chu PT, DPT 02/20/2018, 10:00 AM

## 2018-02-20 NOTE — Consult Note (Signed)
ANTICOAGULATION CONSULT NOTE - Follow up consult  Pharmacy Consult for heparin infusion Indication: ischemia of RLE  No Known Allergies  Patient Measurements: Height: 6\' 2"  (188 cm) Weight: 183 lb (83 kg) IBW/kg (Calculated) : 82.2 Heparin Dosing Weight: 83  Vital Signs: Temp: 98.2 F (36.8 C) (10/29 0400) Temp Source: Oral (10/29 0400) BP: 143/70 (10/29 0700) Pulse Rate: 82 (10/29 0700)  Labs: Recent Labs    02/17/18 1511  02/18/18 0404  02/18/18 1545 02/18/18 1842 02/18/18 2324 02/19/18 0602 02/19/18 1830 02/20/18 0618  HGB  --   --  13.6  --   --   --  12.1*  --   --  13.0  HCT  --   --  40.3  --   --   --  35.5*  --   --  38.2*  PLT  --   --  175  --   --   --  184  --   --  189  APTT 124*  --   --   --   --   --   --   --   --   --   LABPROT 14.7  15.1  --   --   --   --   --   --   --   --   --   INR 1.16  1.20  --   --   --   --   --   --   --   --   --   HEPARINUNFRC  --    < > 0.79*  --  0.66  --  0.54 0.42  --  0.43  CREATININE  --   --   --    < >  --  1.18  --  1.03 1.01  --   TROPONINI  --   --   --   --  0.06* 0.07* 0.08*  --   --   --    < > = values in this interval not displayed.    Estimated Creatinine Clearance: 73.5 mL/min (by C-G formula based on SCr of 1.01 mg/dL).   Medical History: Past Medical History:  Diagnosis Date  . AAA (abdominal aortic aneurysm) (HCC)   . Diabetes mellitus without complication (HCC)     Medications:    Assessment: 77 y.o. male the past medical history of AAA status post repair, diabetes, intermittent claudication, presents to the emergency department for lower extremity weakness.  Goal of Therapy:  Heparin level 0.3-0.7 units/ml Monitor platelets by anticoagulation protocol: Yes   Plan:  10/29 @ 0600 HL 0.43 therapeutic. Will continue current rate and will recheck w/ am labs.  Abbie Sons, PharmD Clinical Pharmacist 02/20/2018

## 2018-02-21 ENCOUNTER — Inpatient Hospital Stay: Payer: Medicare Other

## 2018-02-21 LAB — BASIC METABOLIC PANEL
Anion gap: 9 (ref 5–15)
BUN: 17 mg/dL (ref 8–23)
CALCIUM: 8.8 mg/dL — AB (ref 8.9–10.3)
CO2: 24 mmol/L (ref 22–32)
Chloride: 107 mmol/L (ref 98–111)
Creatinine, Ser: 1 mg/dL (ref 0.61–1.24)
GFR calc Af Amer: >60 mL/min (ref >60)
GLUCOSE: 164 mg/dL — AB (ref 70–99)
POTASSIUM: 3.6 mmol/L (ref 3.5–5.1)
SODIUM: 140 mmol/L (ref 135–145)

## 2018-02-21 LAB — HEPARIN LEVEL (UNFRACTIONATED)
HEPARIN UNFRACTIONATED: 0.28 [IU]/mL — AB (ref 0.30–0.70)
Heparin Unfractionated: 0.45 IU/mL (ref 0.30–0.70)

## 2018-02-21 LAB — GLUCOSE, CAPILLARY
GLUCOSE-CAPILLARY: 165 mg/dL — AB (ref 70–99)
GLUCOSE-CAPILLARY: 167 mg/dL — AB (ref 70–99)
Glucose-Capillary: 171 mg/dL — ABNORMAL HIGH (ref 70–99)
Glucose-Capillary: 189 mg/dL — ABNORMAL HIGH (ref 70–99)

## 2018-02-21 LAB — MAGNESIUM: MAGNESIUM: 1.9 mg/dL (ref 1.7–2.4)

## 2018-02-21 LAB — CBC
HCT: 32.9 % — ABNORMAL LOW (ref 39.0–52.0)
Hemoglobin: 11.4 g/dL — ABNORMAL LOW (ref 13.0–17.0)
MCH: 33.1 pg (ref 26.0–34.0)
MCHC: 34.7 g/dL (ref 30.0–36.0)
MCV: 95.6 fL (ref 80.0–100.0)
NRBC: 0 % (ref 0.0–0.2)
PLATELETS: 194 10*3/uL (ref 150–400)
RBC: 3.44 MIL/uL — AB (ref 4.22–5.81)
RDW: 12.4 % (ref 11.5–15.5)
WBC: 5.8 10*3/uL (ref 4.0–10.5)

## 2018-02-21 NOTE — Progress Notes (Addendum)
[facility] - [place] at [facility]      PATIENT NAME: Kurt [name]    MR#:  [id number]  DATE OF BIRTH:  12-Apr-1941  SUBJECTIVE:   Pt. Seen earlier after working with physical therapy.  No complaints presently.  No chest pain, shortness of breath, palpitations or any further evidence of VT.    REVIEW OF SYSTEMS:   Review of Systems  Constitutional: Negative for chills and fever.  HENT: Negative for congestion and tinnitus.   Eyes: Negative for blurred vision and double vision.  Respiratory: Negative for cough, shortness of breath and wheezing.   Cardiovascular: Negative for chest pain, orthopnea and PND.  Gastrointestinal: Negative for abdominal pain, diarrhea, nausea and vomiting.  Genitourinary: Negative for dysuria and hematuria.  Neurological: Negative for dizziness, sensory change and focal weakness.  All other systems reviewed and are negative.   Nutrition: Carb control diet Tolerating Diet: Yes Tolerating PT: Eval noted.   DRUG ALLERGIES:  No Known Allergies  VITALS:  Blood pressure (!) 160/87, pulse (!) 101, temperature 97.6 F (36.4 C), temperature source Oral, resp. rate 18, height 6\' 3"  (1.905 m), weight 83.4 kg, SpO2 100 %.  PHYSICAL EXAMINATION:   Physical Exam  GENERAL:  77 y.o.-year-old patient sitting up in chair in NAD.  EYES: Pupils equal, round, reactive to light and accommodation. No scleral icterus. Extraocular muscles intact.  HEENT: Head atraumatic, normocephalic. Oropharynx and nasopharynx clear.  NECK:  Supple, no jugular venous distention. No thyroid enlargement, no tenderness.  LUNGS: Normal breath sounds bilaterally, no wheezing, rales, rhonchi. No use of accessory muscles of respiration.  CARDIOVASCULAR: S1, S2 normal. No murmurs, rubs, or gallops.  ABDOMEN: Soft, nontender, nondistended. Bowel sounds present. No organomegaly or mass.  EXTREMITIES: No cyanosis, clubbing or edema b/l.    NEUROLOGIC: Cranial nerves II  through XII are intact. No focal Motor or sensory deficits b/l.   PSYCHIATRIC: The patient is alert and oriented x 3.  SKIN: No obvious rash, lesion, or ulcer.    LABORATORY PANEL:   CBC Recent Labs  Lab 02/21/18 0621  WBC 5.8  HGB 11.4*  HCT 32.9*  PLT 194   ------------------------------------------------------------------------------------------------------------------  Chemistries  Recent Labs  Lab 02/19/18 1830  02/21/18 0621  NA 138   < > 140  K 3.8   < > 3.6  CL 107   < > 107  CO2 26   < > 24  GLUCOSE 92   < > 164*  BUN 17   < > 17  CREATININE 1.01   < > 1.00  CALCIUM 9.2   < > 8.8*  MG 1.7   < > 1.9  AST 40  --   --   ALT 22  --   --   ALKPHOS 65  --   --   BILITOT 0.6  --   --    < > = values in this interval not displayed.   ------------------------------------------------------------------------------------------------------------------  Cardiac Enzymes Recent Labs  Lab 02/18/18 2324  TROPONINI 0.08*   ------------------------------------------------------------------------------------------------------------------  RADIOLOGY:  [name]  Result Date: 02/21/2018 CLINICAL DATA:  Initial evaluation for generalized muscle weakness. History of right lower extremity chronic bow CIS, status post angioplasty. EXAM: MRI HEAD WITHOUT CONTRAST TECHNIQUE: Multiplanar, multiecho pulse sequences of the brain and surrounding structures were obtained without intravenous contrast. COMPARISON:  Prior MRI from 05/21/2014. FINDINGS: Brain: Advanced frontal and temporal lobe predominant cerebral atrophy seen, mildly progressed relative to previous MRI from 2016. Patchy  and confluent T2/FLAIR hyperintensity within the periventricular and deep white matter both cerebral hemispheres most consistent with chronic small vessel ischemic disease, also mildly progressed. Encephalomalacia within the parasagittal right temporal occipital region consistent with chronic right  PCA territory infarct. Associated chronic hemosiderin staining within this region. Additional tiny remote right cerebellar infarct noted. Subtle 9 mm focus of mild diffusion abnormality within the right cerebellar hemisphere, near the right middle cerebellar peduncle (series 2, image 13), likely sequelae of subacute ischemia. No associated hemorrhage or mass effect. No other evidence for acute or subacute infarct. Gray-white matter differentiation otherwise maintained. No other acute or chronic intracranial hemorrhage. No mass lesion, midline shift or mass effect. Diffuse ventricular prominence most likely related to global parenchymal volume loss, similar relative to previous exam. No extra-axial fluid collection. Pituitary gland within normal limits. Vascular: Major intracranial vascular flow voids maintained. Dominant left vertebral artery with hypoplastic right vertebral artery noted. Skull and upper cervical spine: Craniocervical junction within normal limits. Mild cervical spondylolysis at C3-4 without significant stenosis. Bone marrow signal intensity within normal limits. Scalp soft tissues unremarkable. Sinuses/Orbits: Globes and orbital soft tissues within normal limits. Paranasal sinuses are clear. Trace right mastoid effusion noted. Inner ear structures normal. Other: None. IMPRESSION: 1. 9 mm focus of subtle diffusion abnormality within the right cerebellar hemisphere, most likely a small subacute ischemic infarct. No associated hemorrhage or mass effect. Finding is felt to most likely be incidental in nature. 2. No other acute intracranial abnormality. 3. Chronic hemorrhagic right PCA territory infarct. 4. Moderate cerebral atrophy with chronic small vessel ischemic disease, mildly progressed relative to 2016. 5. Diffuse ventricular prominence most likely related to global parenchymal atrophy, similar to previous. Electronically Signed   By: Rise Mu M.D.   On: 02/21/2018 03:19      ASSESSMENT AND PLAN:   77 year old male with past medical history of diabetes, abdominal aortic aneurysm, peripheral vascular disease who presented to the hospital due to right leg pain and claudication noted to have an ischemic right leg.  1.  Right leg ischemia-seen by vascular surgery and patient is status post right femoral thromboendarterectomy, femorofemoral and profunda embolectomy. -Appreciate vascular surgery input and patient is doing well from their standpoint.  Continue aspirin, Plavix, heparin drip for now.  Patient can be discharged aspirin, Eliquis and follow-up with them in 2 to 3 weeks.  2.  Sustained ventricular tachycardia- patient developed this postoperatively and was transferred to the intensive care unit.  Patient received boluses of amiodarone and episodes have stopped. - Electrolytes have been replaced with magnesium and potassium. - Seen by cardiology and echocardiogram showing severe LV dysfunction with EF of 25% and they recommended possible ischemic work-up with cardiac catheterization but the patient is refusing/deferring at this time.    3.  COPD-no acute exacerbation-continue duo nebs as needed.  4.  Diabetes type 2 without complication- continue sliding scale insulin.  Follow blood sugars are stable. -Continue glimepiride.  5.  Tobacco abuse-continue nicotine patch.  Pt is recommending SNF/STR and will have them reeval in a.m. Pt. Prefers SNF for now.    All the records are reviewed and case discussed with Care Management/Social Worker. Management plans discussed with the patient, family and they are in agreement.  CODE STATUS: Full code  DVT Prophylaxis: Heparin gtt  TOTAL TIME TAKING CARE OF THIS PATIENT: 30 minutes.   POSSIBLE D/C IN 1-2 DAYS, DEPENDING ON CLINICAL CONDITION.   Houston Siren M.D on 02/21/2018 at 9:12 PM  Between 7am  to 6pm - Pager - [phone]  After 6pm go to [email or web address] - Social research officer, government  [facility]  [facility]  Office  [phone]  CC: Primary care physician; Jaclyn Shaggy, MD

## 2018-02-21 NOTE — Progress Notes (Signed)
Pt up in chair.  Foley catheter removed, instructed on using a urinal and letting the RN or nursing assistant know when he has voided.

## 2018-02-21 NOTE — Clinical Social Work Note (Signed)
CSW was informed that patient would like to go to SNF for short term rehab.  CSW will begin bed search in [facility] per patient's request.  Ervin Knack. Sharan Mcenaney, MSW, Theresia Majors [phone]  02/21/2018 6:45 PM

## 2018-02-21 NOTE — NC FL2 (Signed)
[place] MEDICAID FL2 LEVEL OF CARE SCREENING TOOL     IDENTIFICATION  Patient Name: Kurt [name] Birthdate: 16-Jun-1940 Sex: male Admission Date (Current Location): 02/17/2018  [place] and [place] Number:  Chiropodist and Address:  [facility], [address], [place], [place] [address]      Provider Number: [id number]  Attending Physician Name and Address:  Houston Siren, MD  Relative Name and Phone Number:  Francella Solian   [phone] or Summers,Louanne Friend [phone]     Current Level of Care:   Recommended Level of Care: Skilled Nursing Facility Prior Approval Number:    Date Approved/Denied:   PASRR Number: [id number]  Discharge Plan: SNF    Current Diagnoses: Patient Active Problem List   Diagnosis Date Noted  . Pressure injury of skin 02/18/2018  . 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 Carotid stenosis 11/14/2017  . Diabetes mellitus type 2, uncomplicated (HCC) 03/02/2017  . Imbalance 07/06/2016  . 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 Tobacco dependence 05/06/2016  . Diabetes (HCC) 05/06/2016  . Abdominal aortic aneurysm without rupture (HCC) 05/06/2016  . Atherosclerosis of native arteries of extremity with intermittent claudication (HCC) 05/06/2016  . Atherosclerotic PVD with intermittent claudication (HCC) 05/20/2014    Orientation RESPIRATION BLADDER Height & Weight     Situation, Self, Time, Place  Normal Continent Weight: 183 lb 14.4 oz (83.4 kg) Height:  6\' 3"  (190.5 cm)  BEHAVIORAL SYMPTOMS/MOOD NEUROLOGICAL BOWEL NUTRITION STATUS      Continent Diet(Crab Modified)  AMBULATORY STATUS COMMUNICATION OF NEEDS Skin   Limited Assist Verbally Normal                       Personal Care Assistance Level of Assistance  Bathing, Feeding, Dressing Bathing Assistance: Limited assistance Feeding assistance: Limited assistance Dressing Assistance: Limited assistance      Functional Limitations Info  Sight, Speech, Hearing Sight Info: Adequate Hearing Info: Adequate Speech Info: Adequate    SPECIAL CARE FACTORS FREQUENCY  PT (By licensed PT), OT (By licensed OT)     PT Frequency: 5x a week OT Frequency: 5x a week            Contractures Contractures Info: Not present    Additional Factors Info  Code Status, Allergies, Insulin Sliding Scale Code Status Info: Full Code Allergies Info: NKA   Insulin Sliding Scale Info: insulin aspart (novoLOG) injection 0-9 Units 3x a day with meals       Current Medications (02/21/2018):  This is the current hospital active medication list Current Facility-Administered Medications  Medication Dose Route Frequency Provider Last Rate Last Dose  . acetaminophen (TYLENOL) tablet 650 mg  650 mg Oral Q6H PRN Enedina Finner, MD       Or  . acetaminophen (TYLENOL) suppository 650 mg  650 mg Rectal Q6H PRN Enedina Finner, MD      . aspirin EC tablet 81 mg  81 mg Oral Daily Enedina Finner, MD   81 mg at 02/21/18 0913  . cholecalciferol (VITAMIN D) tablet 5,000 Units  5,000 Units Oral Daily Enedina Finner, MD   5,000 Units at 02/21/18 1253  . clopidogrel (PLAVIX) tablet 75 mg  75 mg Oral Daily Enedina Finner, MD   75 mg at 02/21/18 0913  . docusate sodium (COLACE) capsule 100 mg  100 mg Oral BID Enedina Finner, MD   100 mg at 02/21/18 0913  . donepezil (ARICEPT) tablet 5 mg  5 mg Oral QHS Enedina Finner, MD   5 mg at 02/20/18 2235  . gabapentin (NEURONTIN) capsule 300 mg  300 mg Oral Daily PRN Enedina Finner, MD      . glimepiride (AMARYL) tablet 2 mg  2 mg Oral Q breakfast Enedina Finner, MD   2 mg at 02/21/18 1610  . heparin ADULT infusion 100 units/mL (25000 units/273mL sodium chloride 0.45%)  1,150 Units/hr Intravenous Continuous Annice Needy, MD 11.5 mL/hr at 02/21/18 0913 1,150 Units/hr at 02/21/18 0913  . insulin aspart (novoLOG) injection 0-9 Units  0-9 Units Subcutaneous TID WC Annice Needy, MD   2 Units at 02/21/18 1742  .  ipratropium-albuterol (DUONEB) 0.5-2.5 (3) MG/3ML nebulizer solution 3 mL  3 mL Nebulization Q4H PRN Samaan, Maged, MD      . nicotine (NICODERM CQ - dosed in mg/24 hours) patch 21 mg  21 mg Transdermal Daily Enedina Finner, MD   21 mg at 02/21/18 0914  . traMADol (ULTRAM) tablet 50 mg  50 mg Oral Q6H PRN Enedina Finner, MD   50 mg at 02/18/18 2150  . triamcinolone cream (KENALOG) 0.1 %   Topical BID Eugenie Norrie, NP      . vitamin B-12 (CYANOCOBALAMIN) tablet 1,000 mcg  1,000 mcg Oral Daily Enedina Finner, MD   1,000 mcg at 02/21/18 0913     Discharge Medications: Please see discharge summary for a list of discharge medications.  Relevant Imaging Results:  Relevant Lab Results:   Additional Information SSN [id number]  Darleene Cleaver, [place]

## 2018-02-21 NOTE — Care Management Important Message (Signed)
Copy of signed IM left with patient in room.  

## 2018-02-21 NOTE — Progress Notes (Addendum)
[facility]  Daily Progress Note   Subjective  - 4 Days Post-Op  No complaints.  Feet and leg feel fine  Objective Vitals:   02/20/18 2000 02/20/18 2050 02/21/18 0359 02/21/18 0802  BP: (!) 141/67 135/83 140/82 (!) 149/80  Pulse: 89 84 89 78  Resp: 19 18 17 18   Temp:  98.5 F (36.9 C) 98.1 F (36.7 C) 98.5 F (36.9 C)  TempSrc:  Oral Oral Oral  SpO2: 98% 98% 98% 96%  Weight:  83.4 kg    Height:  6\' 3"  (1.905 m)      Intake/Output Summary (Last 24 hours) at 02/21/2018 1056 Last data filed at 02/21/2018 0200 Gross per 24 hour  Intake 775.64 ml  Output 2475 ml  Net -1699.36 ml    PULM  CTAB CV  RRR VASC  Feet warm, 1+ right PT pulse present.  VAC with good seal  Laboratory CBC    Component Value Date/Time   WBC 5.8 02/21/2018 0621   HGB 11.4 (L) 02/21/2018 0621   HGB 13.7 05/17/2014 0529   HCT 32.9 (L) 02/21/2018 0621   HCT 39.9 (L) 05/17/2014 0529   PLT 194 02/21/2018 0621   PLT 149 (L) 05/17/2014 0529    BMET    Component Value Date/Time   NA 140 02/21/2018 0621   NA 139 05/17/2014 0529   K 3.6 02/21/2018 0621   K 3.6 05/17/2014 0529   CL 107 02/21/2018 0621   CL 105 05/17/2014 0529   CO2 24 02/21/2018 0621   CO2 27 05/17/2014 0529   GLUCOSE 164 (H) 02/21/2018 0621   GLUCOSE 113 (H) 05/17/2014 0529   BUN 17 02/21/2018 0621   BUN 8 05/17/2014 0529   CREATININE 1.00 02/21/2018 0621   CREATININE 0.95 05/17/2014 0529   CALCIUM 8.8 (L) 02/21/2018 0621   CALCIUM 8.7 05/17/2014 0529   GFRNONAA >60 02/21/2018 0621   GFRNONAA >60 05/17/2014 0529   GFRAA >60 02/21/2018 0621   GFRAA >60 05/17/2014 0529    Assessment/Planning: POD #4 s/p right femoral thromboendarterectomy, fem-fem and profunda embolectomy   Doing well from vascular POV  Has deferred cardiac cath  No other recs from vascular POV  If no other cardiac recs, can likely be discharged on ASA 81 mg daily and Eliquis 5 mg bid.  Follow up with my NP in office in  2-3 weeks with ABIs  Incisional vac can be removed and just use dry gauze to right groin site    [name]  02/21/2018, 10:56 AM

## 2018-02-21 NOTE — Clinical Social Work Note (Signed)
Clinical Social Work Assessment  Patient Details  Name: Kurt [name] MRN: [id number] Date of Birth: 06-26-1940  Date of referral:  02/21/18               Reason for consult:  Facility Placement                Permission sought to share information with:  Family Supports, Magazine features editor Permission granted to share information::  Yes, Verbal Permission Granted  Name::     Fredirick Lathe Sister   [phone] or Charlestine Massed [phone]   Agency::  SNF admissions  Relationship::     Contact Information:     Housing/Transportation Living arrangements for the past 2 months:  Single Family Home Source of Information:  Patient Patient Interpreter Needed:  None Criminal Activity/Legal Involvement Pertinent to Current Situation/Hospitalization:  No - Comment as needed Significant Relationships:  Siblings, Parents Lives with:  Self Do you feel safe going back to the place where you live?  No Need for family participation in patient care:  No (Coment)  Care giving concerns: Patient feels he needs some short term rehab before he is able to return back home.  Social Worker assessment / plan:  Patient is a 77 year old male who is alert and oriented x4.  Patient states he lives alone, and has been to rehab in the past.  CSW explained role of CSW and process for looking for placement to SNF.  CSW discussed with patient how insurance will pay for stay and what to expect.  Patient expressed that he is familiar with what a SNF is like, he was at [facility] and [facility] in the Past, he would prefer [facility] if possible.  CSW explained to patient that it may take up to 48 hours for insurance approval, patient gave CSW permission to begin bed search in [facility], patient did not have any other questions or concerns.   Employment status:  Retired Database administrator PT Recommendations:  Skilled Nursing Facility Information / Referral to community  resources:  Skilled Nursing Facility  Patient/Family's Response to care:  Patient in agreement to going to SNF for short term rehab.  Patient/Family's Understanding of and Emotional Response to Diagnosis, Current Treatment, and Prognosis:  Patient is hopeful that he will not have to be in SNF for very long.  Emotional Assessment Appearance:  Appears stated age Attitude/Demeanor/Rapport:    Affect (typically observed):  Appropriate, Pleasant, Stable Orientation:  Oriented to Self, Oriented to Place, Oriented to  Time, Oriented to Situation Alcohol / Substance use:  Tobacco Use Psych involvement (Current and /or in the community):  No (Comment)  Discharge Needs  Concerns to be addressed:  No discharge needs identified Readmission within the last 30 days:  No Current discharge risk:  Lack of support system Barriers to Discharge:  Continued Medical Work up   [place] Constable 02/21/2018, 6:39 PM

## 2018-02-21 NOTE — Evaluation (Signed)
Physical Therapy Evaluation Patient Details Name: Kurt [name] MRN: [id number] DOB: May 18, 1940 Today's Date: 02/21/2018   History of Present Illness  Pt is a 77 y/o M s/p femoral to femoral bypass with continued occlusion shown on CT angiogram.  Pt with Vtach following procedure.  Pt declined further cardiac workup. No further plans for vascular intervention. Per girlfriend pt with forgetfulness and brain MRI scheduled for the outpatient setting following d/c.  Pt's PMH includes AAA.     Clinical Impression  Pt admitted with above diagnosis. Pt currently with functional limitations due to the deficits listed below (see PT Problem List). Mr. Milne was independent with ADLs and mobility at baseline without AD; however, he does report 3-4 falls in the past 6 months which he reports are from losing his balance posteriorly.  Pt with significant posterior lean with all aspects of mobility this session despite cues and corrective techniques, requiring min assist for bed mobility, and mod assist to stand and ambulate short distance to chair.  Given pt's current mobility status, recommending SNF at d/c.  Pt will benefit from skilled PT to increase their independence and safety with mobility to allow discharge to the venue listed below.      Follow Up Recommendations SNF    Equipment Recommendations  None recommended by PT    Recommendations for Other Services       Precautions / Restrictions Precautions Precautions: Fall;Other (comment) Precaution Comments: monitor HR, has wound vac Restrictions Weight Bearing Restrictions: No      Mobility  Bed Mobility Overal bed mobility: Needs Assistance Bed Mobility: Supine to Sit     Supine to sit: Min assist;HOB elevated     General bed mobility comments: Pt requires heavy use of bed rails and loses his balance posteriorly multiple times with supine>sit attempt, ultimately requiring assist to elevate trunk.    Transfers Overall transfer  level: Needs assistance Equipment used: Rolling walker (2 wheeled) Transfers: Sit to/from Stand Sit to Stand: Mod assist;From elevated surface         General transfer comment: Bed elevated and cues for proper hand placement and safe technique with boost to assist with standing as well as to prevent posterior LOB.  Manually assisted pt in finding center of mass but with assist taken away pt loses balance posteriorly in standing.  Pt requires mod assist to prevent posterior LOB with stand pivot, especially when taking steps to back up to chair.    Ambulation/Gait Ambulation/Gait assistance: Mod assist Gait Distance (Feet): 6 Feet Assistive device: Rolling walker (2 wheeled) Gait Pattern/deviations: Decreased step length - right;Decreased step length - left Gait velocity: decreased Gait velocity interpretation: <1.31 ft/sec, indicative of household ambulator General Gait Details: Pt continues to demonstrate strong posterior lean when ambulating from bed to chair despite corrective cues and assist.    Stairs            Wheelchair Mobility    Modified Rankin (Stroke Patients Only)       Balance Overall balance assessment: Needs assistance;History of Falls Sitting-balance support: Bilateral upper extremity supported;Feet supported Sitting balance-Leahy Scale: Poor Sitting balance - Comments: Pt with intermittent LOB posteriorly sitting EOB despite corrective cues and BUE and BLE support Postural control: Posterior lean Standing balance support: Bilateral upper extremity supported;During functional activity Standing balance-Leahy Scale: Poor Standing balance comment: Pt relies on BUE support and outside physical assist for static and dynamic activities  Pertinent Vitals/Pain Pain Assessment: No/denies pain    Home Living Family/patient expects to be discharged to:: Private residence Living Arrangements: Alone Available Help at  Discharge: (family and friends not reliable per pt) Type of Home: House Home Access: Ramped entrance(with 1 step up to ramp, ramp has Bil rails)     Home Layout: One level   Additional Comments: Pt reports having one narrow hallway that his walker will not fit but pt reports he will be able to access his bedroom, bathroom, and kitchen with RW after entering home.     Prior Function Level of Independence: Independent         Comments: Pt ambulating without AD, reports 3-4 falls in the past 6 months.  Pt ind with ADLs.       Hand Dominance        Extremity/Trunk Assessment   Upper Extremity Assessment Upper Extremity Assessment: Overall WFL for tasks assessed    Lower Extremity Assessment Lower Extremity Assessment: (Strength BLE grossly 4/5)       Communication   Communication: No difficulties  Cognition Arousal/Alertness: Awake/alert Behavior During Therapy: WFL for tasks assessed/performed Overall Cognitive Status: Within Functional Limits for tasks assessed                                        General Comments General comments (skin integrity, edema, etc.): HR stable and responds appropriately to exercise throughout session. Pt incontinent of bowel ambulating from bed to chair which pt reports is new for him (incontinent of urine at baseline), RN made aware.      Exercises Other Exercises Other Exercises: Pt sitting EOB for ~5 minutes, intermittently losing his balance posteriorly despite cues for corrective techniques and feet on the floor with BUE supported.    Assessment/Plan    PT Assessment Patient needs continued PT services  PT Problem List Decreased strength;Decreased activity tolerance;Decreased balance;Decreased knowledge of use of DME;Decreased safety awareness       PT Treatment Interventions DME instruction;Gait training;Stair training;Functional mobility training;Therapeutic activities;Therapeutic exercise;Balance  training;Cognitive remediation;Neuromuscular re-education;Patient/family education;Modalities    PT Goals (Current goals can be found in the Care Plan section)  Acute Rehab PT Goals Patient Stated Goal: to return to PLOF PT Goal Formulation: With patient Time For Goal Achievement: 03/07/18 Potential to Achieve Goals: Good    Frequency Min 2X/week   Barriers to discharge Decreased caregiver support No assist avaialble at d/c    Co-evaluation               AM-PAC PT "6 Clicks" Daily Activity  Outcome Measure Difficulty turning over in bed (including adjusting bedclothes, sheets and blankets)?: A Lot Difficulty moving from lying on back to sitting on the side of the bed? : Unable Difficulty sitting down on and standing up from a chair with arms (e.g., wheelchair, bedside commode, etc,.)?: Unable Help needed moving to and from a bed to chair (including a wheelchair)?: A Lot Help needed walking in hospital room?: A Lot Help needed climbing 3-5 steps with a railing? : Total 6 Click Score: 9    End of Session Equipment Utilized During Treatment: Gait belt Activity Tolerance: Other (comment)(limited due to inability to correct posterior lean) Patient left: in chair;with call bell/phone within reach;with chair alarm set Nurse Communication: Mobility status;Other (comment)(posterior bias, incontinent of bowel) PT Visit Diagnosis: Unsteadiness on feet (R26.81);Difficulty in walking, not elsewhere classified (R26.2);History of  falling (Z91.81);Other abnormalities of gait and mobility (R26.89)    Time: 2440-1027 PT Time Calculation (min) (ACUTE ONLY): 33 min   Charges:   PT Evaluation $PT Eval Moderate Complexity: 1 Mod PT Treatments $Therapeutic Activity: 8-22 mins        Encarnacion Chu PT, DPT 02/21/2018, 1:28 PM

## 2018-02-21 NOTE — Consult Note (Signed)
ANTICOAGULATION CONSULT NOTE - Follow up consult  Pharmacy Consult for heparin infusion Indication: ischemia of RLE  No Known Allergies  Patient Measurements: Height: 6\' 3"  (190.5 cm) Weight: 183 lb 14.4 oz (83.4 kg) IBW/kg (Calculated) : 84.5 Heparin Dosing Weight: 83  Vital Signs: Temp: 98.5 F (36.9 C) (10/30 0802) Temp Source: Oral (10/30 0802) BP: 149/80 (10/30 0802) Pulse Rate: 78 (10/30 0802)  Labs: Recent Labs    02/18/18 1545 02/18/18 1842  02/18/18 2324 02/19/18 0602 02/19/18 1830 02/20/18 0618 02/21/18 0621  HGB  --   --    < > 12.1*  --   --  13.0 11.4*  HCT  --   --   --  35.5*  --   --  38.2* 32.9*  PLT  --   --   --  184  --   --  189 194  HEPARINUNFRC 0.66  --   --  0.54 0.42  --  0.43 0.28*  CREATININE  --  1.18  --   --  1.03 1.01 1.10 1.00  TROPONINI 0.06* 0.07*  --  0.08*  --   --   --   --    < > = values in this interval not displayed.    Estimated Creatinine Clearance: 75.3 mL/min (by C-G formula based on SCr of 1 mg/dL).   Medical History: Past Medical History:  Diagnosis Date  . AAA (abdominal aortic aneurysm) (HCC)   . Diabetes mellitus without complication (HCC)     Medications:    Assessment: 77 y.o. male the past medical history of AAA status post repair, diabetes, intermittent claudication, presents to the emergency department for lower extremity weakness.   10/29 @ 0600 HL 0.43 therapeutic. Will continue current rate and will recheck w/ am labs.  Goal of Therapy:  Heparin level 0.3-0.7 units/ml Monitor platelets by anticoagulation protocol: Yes   Plan:  10/30 @ 0621 HL= 0.28. Will increase drip to 1150 units/hr. Will check HL in 8 hours.  Bari Mantis PharmD Clinical Pharmacist 02/21/2018

## 2018-02-21 NOTE — Progress Notes (Signed)
Pharmacy Electrolyte Monitoring Consult:  Pharmacy consulted to assist in monitoring and replacing electrolytes in this 77 y.o. male admitted on 02/17/2018 with ischemic foot. Patient admitted to ICU with ventricular tachycardia after rapid response called on 10/27.   Labs:  Sodium (mmol/L)  Date Value  02/21/2018 140  05/17/2014 139   Potassium (mmol/L)  Date Value  02/21/2018 3.6  05/17/2014 3.6   Magnesium (mg/dL)  Date Value  29/56/2130 1.9  05/15/2014 1.6 (L)   Phosphorus (mg/dL)  Date Value  86/57/8469 2.9  05/15/2014 2.1 (L)   Calcium (mg/dL)  Date Value  62/95/2841 8.8 (L)   Calcium, Total (mg/dL)  Date Value  32/44/0102 8.7   Albumin (g/dL)  Date Value  72/53/6644 3.4 (L)  05/15/2014 3.1 (L)    Assessment/Plan: K 3.6, Mag 1.9. No additional supplement indicated at this time.  Pharmacy will continue to monitor and adjust per consult.   Savera Donson A, Pharm.D., BCPS Clinical Pharmacist 02/21/2018 8:51 AM

## 2018-02-21 NOTE — Consult Note (Signed)
ANTICOAGULATION CONSULT NOTE - Follow up consult  Pharmacy Consult for heparin infusion Indication: ischemia of RLE  No Known Allergies  Patient Measurements: Height: 6\' 3"  (190.5 cm) Weight: 183 lb 14.4 oz (83.4 kg) IBW/kg (Calculated) : 84.5 Heparin Dosing Weight: 83  Vital Signs: Temp: 98.4 F (36.9 C) (10/30 1650) Temp Source: Oral (10/30 1650) BP: 151/84 (10/30 1650) Pulse Rate: 85 (10/30 1650)  Labs: Recent Labs    02/18/18 1842  02/18/18 2324  02/19/18 1830 02/20/18 0618 02/21/18 0621 02/21/18 1654  HGB  --    < > 12.1*  --   --  13.0 11.4*  --   HCT  --   --  35.5*  --   --  38.2* 32.9*  --   PLT  --   --  184  --   --  189 194  --   HEPARINUNFRC  --   --  0.54   < >  --  0.43 0.28* 0.45  CREATININE 1.18  --   --    < > 1.01 1.10 1.00  --   TROPONINI 0.07*  --  0.08*  --   --   --   --   --    < > = values in this interval not displayed.    Estimated Creatinine Clearance: 75.3 mL/min (by C-G formula based on SCr of 1 mg/dL).   Medical History: Past Medical History:  Diagnosis Date  . AAA (abdominal aortic aneurysm) (HCC)   . Diabetes mellitus without complication (HCC)     Medications:    Assessment: 77 y.o. male the past medical history of AAA status post repair, diabetes, intermittent claudication, presents to the emergency department for lower extremity weakness.   Goal of Therapy:  Heparin level 0.3-0.7 units/ml Monitor platelets by anticoagulation protocol: Yes   Plan:  10/30 1654 HL 0.45. Therapeutic. Will continue heparin drip at 1150 units/hr. Will order level for 0100. CBC with morning labs.  Pricilla Riffle, PharmD Pharmacy Resident  02/21/2018 5:54 PM

## 2018-02-21 NOTE — Progress Notes (Signed)
[facility]  SUBJECTIVE: Kurt [name] is a 77 year old male who is s/p right common femoral artery thromboendarterectomy, embolectomy of the left to right femoral-femoral bypass, and embolectomy of right profunda femoris artery with Dr. Wyn Quaker on 02/17/18.  Had a 16 run episode of Vtach and was started on IV amiodarone. That has been discontinued.  Today, Kurt [name] is sitting up comfortably in bed, eating breakfast. Continues to deny further cardiac workup.  Denies chest pain, shortness of breath, or palpitations.  Currently in no pain and is doing well post-operatively.  States that he is ready to go home but doesn't have anyone to help take care of him.    Vitals:   02/20/18 2000 02/20/18 2050 02/21/18 0359 02/21/18 0802  BP: (!) 141/67 135/83 140/82 (!) 149/80  Pulse: 89 84 89 78  Resp: 19 18 17 18   Temp:  98.5 F (36.9 C) 98.1 F (36.7 C) 98.5 F (36.9 C)  TempSrc:  Oral Oral Oral  SpO2: 98% 98% 98% 96%  Weight:  83.4 kg    Height:  6\' 3"  (1.905 m)       Intake/Output Summary (Last 24 hours) at 02/21/2018 0829 Last data filed at 02/21/2018 0200 Gross per 24 hour  Intake 775.64 ml  Output 2925 ml  Net -2149.36 ml      PHYSICAL EXAM  General: Well developed, well nourished, in no acute distress HEENT:  Normocephalic and atramatic Neck:  No JVD.  Lungs: Clear bilaterally to auscultation and percussion. Heart: HRRR . Normal S1 and S2 without gallops or murmurs.  Abdomen: Bowel sounds are positive, abdomen soft and non-tender  Msk:  Back normal, normal gait. Normal strength and tone for age. Extremities: No clubbing, cyanosis or edema.   Neuro: Alert and oriented X 3. Psych:  Good affect, responds appropriately   LABS: Basic Metabolic Panel: Recent Labs    02/18/18 1842  02/20/18 0618 02/20/18 1933 02/21/18 0621  NA 137   < > 144  --  140  K 3.8   < > 4.2  --  3.6  CL 102   < > 106  --  107  CO2 26   < > 27  --  24  GLUCOSE 166*   < > 141*  --  164*  BUN 20   <  > 15  --  17  CREATININE 1.18   < > 1.10  --  1.00  CALCIUM 8.9   < > 9.1  --  8.8*  MG 2.1   < > 1.7 2.1 1.9  PHOS 2.6  --  2.9  --   --    < > = values in this interval not displayed.   Liver Function Tests: Recent Labs    02/19/18 1830  AST 40  ALT 22  ALKPHOS 65  BILITOT 0.6  PROT 6.3*  ALBUMIN 3.4*   No results for input(s): LIPASE, AMYLASE in the last 72 hours. CBC: Recent Labs    02/20/18 0618 02/21/18 0621  WBC 6.7 5.8  HGB 13.0 11.4*  HCT 38.2* 32.9*  MCV 96.5 95.6  PLT 189 194   Cardiac Enzymes: Recent Labs    02/18/18 1545 02/18/18 1842 02/18/18 2324  TROPONINI 0.06* 0.07* 0.08*   BNP: Invalid input(s): POCBNP D-Dimer: No results for input(s): DDIMER in the last 72 hours. Hemoglobin A1C: No results for input(s): HGBA1C in the last 72 hours. Fasting Lipid Panel: No results for input(s): CHOL, HDL, LDLCALC, TRIG, CHOLHDL, LDLDIRECT in  the last 72 hours. Thyroid Function Tests: No results for input(s): TSH, T4TOTAL, T3FREE, THYROIDAB in the last 72 hours.  Invalid input(s): FREET3 Anemia Panel: No results for input(s): VITAMINB12, FOLATE, FERRITIN, TIBC, IRON, RETICCTPCT in the last 72 hours.  [name]  Result Date: 02/21/2018 CLINICAL DATA:  Initial evaluation for generalized muscle weakness. History of right lower extremity chronic bow CIS, status post angioplasty. EXAM: MRI HEAD WITHOUT CONTRAST TECHNIQUE: Multiplanar, multiecho pulse sequences of the brain and surrounding structures were obtained without intravenous contrast. COMPARISON:  Prior MRI from 05/21/2014. FINDINGS: Brain: Advanced frontal and temporal lobe predominant cerebral atrophy seen, mildly progressed relative to previous MRI from 2016. Patchy and confluent T2/FLAIR hyperintensity within the periventricular and deep white matter both cerebral hemispheres most consistent with chronic small vessel ischemic disease, also mildly progressed. Encephalomalacia within the  parasagittal right temporal occipital region consistent with chronic right PCA territory infarct. Associated chronic hemosiderin staining within this region. Additional tiny remote right cerebellar infarct noted. Subtle 9 mm focus of mild diffusion abnormality within the right cerebellar hemisphere, near the right middle cerebellar peduncle (series 2, image 13), likely sequelae of subacute ischemia. No associated hemorrhage or mass effect. No other evidence for acute or subacute infarct. Gray-white matter differentiation otherwise maintained. No other acute or chronic intracranial hemorrhage. No mass lesion, midline shift or mass effect. Diffuse ventricular prominence most likely related to global parenchymal volume loss, similar relative to previous exam. No extra-axial fluid collection. Pituitary gland within normal limits. Vascular: Major intracranial vascular flow voids maintained. Dominant left vertebral artery with hypoplastic right vertebral artery noted. Skull and upper cervical spine: Craniocervical junction within normal limits. Mild cervical spondylolysis at C3-4 without significant stenosis. Bone marrow signal intensity within normal limits. Scalp soft tissues unremarkable. Sinuses/Orbits: Globes and orbital soft tissues within normal limits. Paranasal sinuses are clear. Trace right mastoid effusion noted. Inner ear structures normal. Other: None. IMPRESSION: 1. 9 mm focus of subtle diffusion abnormality within the right cerebellar hemisphere, most likely a small subacute ischemic infarct. No associated hemorrhage or mass effect. Finding is felt to most likely be incidental in nature. 2. No other acute intracranial abnormality. 3. Chronic hemorrhagic right PCA territory infarct. 4. Moderate cerebral atrophy with chronic small vessel ischemic disease, mildly progressed relative to 2016. 5. Diffuse ventricular prominence most likely related to global parenchymal atrophy, similar to previous. Electronically  Signed   By: Rise Mu M.D.   On: 02/21/2018 03:19    TELEMETRY: Sinus rhythm   ASSESSMENT AND PLAN:  Active Problems:   Ischemic foot   Pressure injury of skin    1.  Episode of Vtach/elevated troponin  -Patient continues to defer further cardiac workup  -Encourage further workup in an outpatient setting upon discharge   2.  Neurological changes  -Scheduled for brain MRI per neurology in an outpatient setting  The history, physical exam findings, and plan of care were all discussed with Dr. Harold Hedge, and all decision making was made in collaboration.   Andi Hence PA-C 02/21/2018 8:29 AM

## 2018-02-21 NOTE — Plan of Care (Signed)

## 2018-02-22 LAB — CBC
HEMATOCRIT: 33.3 % — AB (ref 39.0–52.0)
Hemoglobin: 11.4 g/dL — ABNORMAL LOW (ref 13.0–17.0)
MCH: 32.5 pg (ref 26.0–34.0)
MCHC: 34.2 g/dL (ref 30.0–36.0)
MCV: 94.9 fL (ref 80.0–100.0)
Platelets: 210 10*3/uL (ref 150–400)
RBC: 3.51 MIL/uL — ABNORMAL LOW (ref 4.22–5.81)
RDW: 12.5 % (ref 11.5–15.5)
WBC: 6.2 10*3/uL (ref 4.0–10.5)
nRBC: 0 % (ref 0.0–0.2)

## 2018-02-22 LAB — HEPARIN LEVEL (UNFRACTIONATED)
HEPARIN UNFRACTIONATED: 1.4 [IU]/mL — AB (ref 0.30–0.70)
Heparin Unfractionated: 0.19 IU/mL — ABNORMAL LOW (ref 0.30–0.70)
Heparin Unfractionated: 0.71 IU/mL — ABNORMAL HIGH (ref 0.30–0.70)

## 2018-02-22 LAB — GLUCOSE, CAPILLARY
GLUCOSE-CAPILLARY: 167 mg/dL — AB (ref 70–99)
GLUCOSE-CAPILLARY: 251 mg/dL — AB (ref 70–99)
Glucose-Capillary: 160 mg/dL — ABNORMAL HIGH (ref 70–99)
Glucose-Capillary: 119 mg/dL — ABNORMAL HIGH (ref 70–99)

## 2018-02-22 LAB — BASIC METABOLIC PANEL
Anion gap: 9 (ref 5–15)
BUN: 19 mg/dL (ref 8–23)
CALCIUM: 9 mg/dL (ref 8.9–10.3)
CO2: 24 mmol/L (ref 22–32)
CREATININE: 1.15 mg/dL (ref 0.61–1.24)
Chloride: 106 mmol/L (ref 98–111)
GFR calc Af Amer: >60 mL/min (ref >60)
GFR calc non Af Amer: >60 mL/min (ref >60)
GLUCOSE: 149 mg/dL — AB (ref 70–99)
Potassium: 4 mmol/L (ref 3.5–5.1)
Sodium: 139 mmol/L (ref 135–145)

## 2018-02-22 LAB — MAGNESIUM: Magnesium: 1.6 mg/dL — ABNORMAL LOW (ref 1.7–2.4)

## 2018-02-22 MED ORDER — MAGNESIUM SULFATE 2 GM/50ML IV SOLN
2.0000 g | Freq: Once | INTRAVENOUS | Status: AC
  Administered 2018-02-22: 2 g via INTRAVENOUS
  Filled 2018-02-22: qty 50

## 2018-02-22 MED ORDER — ATORVASTATIN CALCIUM 20 MG PO TABS
40.0000 mg | ORAL_TABLET | Freq: Every day | ORAL | Status: DC
  Filled 2018-02-22: qty 2

## 2018-02-22 MED ORDER — HEPARIN BOLUS VIA INFUSION
2500.0000 [IU] | Freq: Once | INTRAVENOUS | Status: AC
  Administered 2018-02-22: 2500 [IU] via INTRAVENOUS
  Filled 2018-02-22: qty 2500

## 2018-02-22 MED ORDER — APIXABAN 5 MG PO TABS
5.0000 mg | ORAL_TABLET | Freq: Two times a day (BID) | ORAL | Status: DC
  Administered 2018-02-22 – 2018-02-23 (×3): 5 mg via ORAL
  Filled 2018-02-22 (×3): qty 1

## 2018-02-22 MED ORDER — INSULIN ASPART 100 UNIT/ML ~~LOC~~ SOLN
0.0000 [IU] | Freq: Every day | SUBCUTANEOUS | Status: DC
  Administered 2018-02-22: 3 [IU] via SUBCUTANEOUS
  Filled 2018-02-22: qty 1

## 2018-02-22 NOTE — Progress Notes (Signed)
Pt's sister, Fredirick Lathe, at bedside.  Requests her name be put on chart.  RN noted that pt's sister as well as another contact, Ardine Bjork, are both on the chart.  Pt requests Louanne be removed from contact list.

## 2018-02-22 NOTE — Progress Notes (Signed)
Clinical Child psychotherapist (CSW) presented bed offers to patient and he chose [facility]	[place]. Per [facility] admissions coordinator at [facility] she will start [facility] SNF authorization today. CSW will continue to follow and assist as needed.   [employer], LCSW [phone]

## 2018-02-22 NOTE — Consult Note (Signed)
ANTICOAGULATION CONSULT NOTE - Follow up consult  Pharmacy Consult for heparin infusion Indication: ischemia of RLE  No Known Allergies  Patient Measurements: Height: 6\' 3"  (190.5 cm) Weight: 183 lb 14.4 oz (83.4 kg) IBW/kg (Calculated) : 84.5 Heparin Dosing Weight: 83  Vital Signs: Temp: 97.6 F (36.4 C) (10/30 1925) Temp Source: Oral (10/30 1925) BP: 160/87 (10/30 1925) Pulse Rate: 101 (10/30 1925)  Labs: Recent Labs    02/20/18 0618 02/21/18 0621 02/21/18 1654 02/22/18 0057  HGB 13.0 11.4*  --  11.4*  HCT 38.2* 32.9*  --  33.3*  PLT 189 194  --  210  HEPARINUNFRC 0.43 0.28* 0.45 0.19*  CREATININE 1.10 1.00  --  1.15    Estimated Creatinine Clearance: 65.5 mL/min (by C-G formula based on SCr of 1.15 mg/dL).   Medical History: Past Medical History:  Diagnosis Date  . AAA (abdominal aortic aneurysm) (HCC)   . Diabetes mellitus without complication (HCC)     Medications:    Assessment: 77 y.o. male the past medical history of AAA status post repair, diabetes, intermittent claudication, presents to the emergency department for lower extremity weakness.   Goal of Therapy:  Heparin level 0.3-0.7 units/ml Monitor platelets by anticoagulation protocol: Yes   Plan:  10/30 1654 HL 0.45. Therapeutic. Will continue heparin drip at 1150 units/hr. Will order level for 0100. CBC with morning labs.  10/31 0100 heparin level 0.19. 2500 unit bolus and increase rate to 1400 units/hr. Recheck in 8 hours.  Fulton Reek, PharmD, BCPS  02/22/18 2:48 AM

## 2018-02-22 NOTE — Consult Note (Signed)
ANTICOAGULATION CONSULT NOTE - Follow up consult  Pharmacy Consult for heparin infusion Indication: ischemia of RLE  No Known Allergies  Patient Measurements: Height: 6\' 3"  (190.5 cm) Weight: 183 lb 14.4 oz (83.4 kg) IBW/kg (Calculated) : 84.5 Heparin Dosing Weight: 83  Vital Signs: Temp: 98.1 F (36.7 C) (10/31 0810) Temp Source: Oral (10/31 0810) BP: 152/79 (10/31 0810) Pulse Rate: 86 (10/31 0810)  Labs: Recent Labs    02/20/18 0618 02/21/18 1610 02/21/18 1654 02/22/18 0057 02/22/18 1054  HGB 13.0 11.4*  --  11.4*  --   HCT 38.2* 32.9*  --  33.3*  --   PLT 189 194  --  210  --   HEPARINUNFRC 0.43 0.28* 0.45 0.19* 0.71*  CREATININE 1.10 1.00  --  1.15  --     Estimated Creatinine Clearance: 65.5 mL/min (by C-G formula based on SCr of 1.15 mg/dL).   Medical History: Past Medical History:  Diagnosis Date  . AAA (abdominal aortic aneurysm) (HCC)   . Diabetes mellitus without complication (HCC)     Medications:    Assessment: 77 y.o. male the past medical history of AAA status post repair, diabetes, intermittent claudication, presents to the emergency department for lower extremity weakness.   Goal of Therapy:  Heparin level 0.3-0.7 units/ml Monitor platelets by anticoagulation protocol: Yes   Plan:  10/30 1654 HL 0.45. Therapeutic. Will continue heparin drip at 1150 units/hr. Will order level for 0100. CBC with morning labs.  10/31 0100 heparin level 0.19. 2500 unit bolus and increase rate to 1400 units/hr. Recheck in 8 hours  10/31 1100 heparin level 0.71.  Will decrease rate to heparin 1300 units /hr. Will check heparin level in 8 hours.  Abbie Sons, PharmD 02/22/18 12:56 PM

## 2018-02-22 NOTE — Progress Notes (Signed)
[facility] [place] [facility]      PATIENT NAME: Kurt [name]    MR#:  [id number]  DATE OF BIRTH:  11-23-1940  SUBJECTIVE:   Patient still is quite deconditioned and weak.  Seen by physical therapy and he was a 2 person assist and they are still recommending short-term rehab.  Patient denies any chest pains, shortness of breath or any other associated symptoms.  REVIEW OF SYSTEMS:   Review of Systems  Constitutional: Negative for chills and fever.  HENT: Negative for congestion and tinnitus.   Eyes: Negative for blurred vision and double vision.  Respiratory: Negative for cough, shortness of breath and wheezing.   Cardiovascular: Negative for chest pain, orthopnea and PND.  Gastrointestinal: Negative for abdominal pain, diarrhea, nausea and vomiting.  Genitourinary: Negative for dysuria and hematuria.  Neurological: Negative for dizziness, sensory change and focal weakness.  All other systems reviewed and are negative.   Nutrition: Carb control diet Tolerating Diet: Yes Tolerating PT: Eval noted.   DRUG ALLERGIES:  No Known Allergies  VITALS:  Blood pressure 139/68, pulse 74, temperature 97.9 F (36.6 C), temperature source Oral, resp. rate 19, height 6\' 3"  (1.905 m), weight 83.4 kg, SpO2 100 %.  PHYSICAL EXAMINATION:   Physical Exam  GENERAL:  77 y.o.-year-old patient lying in bed in NAD.  EYES: Pupils equal, round, reactive to light and accommodation. No scleral icterus. Extraocular muscles intact.  HEENT: Head atraumatic, normocephalic. Oropharynx and nasopharynx clear.  NECK:  Supple, no jugular venous distention. No thyroid enlargement, no tenderness.  LUNGS: Normal breath sounds bilaterally, no wheezing, rales, rhonchi. No use of accessory muscles of respiration.  CARDIOVASCULAR: S1, S2 normal. No murmurs, rubs, or gallops.  ABDOMEN: Soft, nontender, nondistended. Bowel sounds present. No organomegaly or mass.  EXTREMITIES: No cyanosis,  clubbing or edema b/l.    NEUROLOGIC: Cranial nerves II through XII are intact. No focal Motor or sensory deficits b/l.   PSYCHIATRIC: The patient is alert and oriented x 3.  SKIN: No obvious rash, lesion, or ulcer.    LABORATORY PANEL:   CBC Recent Labs  Lab 02/22/18 0057  WBC 6.2  HGB 11.4*  HCT 33.3*  PLT 210   ------------------------------------------------------------------------------------------------------------------  Chemistries  Recent Labs  Lab 02/19/18 1830  02/22/18 0057  NA 138   < > 139  K 3.8   < > 4.0  CL 107   < > 106  CO2 26   < > 24  GLUCOSE 92   < > 149*  BUN 17   < > 19  CREATININE 1.01   < > 1.15  CALCIUM 9.2   < > 9.0  MG 1.7   < > 1.6*  AST 40  --   --   ALT 22  --   --   ALKPHOS 65  --   --   BILITOT 0.6  --   --    < > = values in this interval not displayed.   ------------------------------------------------------------------------------------------------------------------  Cardiac Enzymes Recent Labs  Lab 02/18/18 2324  TROPONINI 0.08*   ------------------------------------------------------------------------------------------------------------------  RADIOLOGY:  [name]  Result Date: 02/21/2018 CLINICAL DATA:  Initial evaluation for generalized muscle weakness. History of right lower extremity chronic bow CIS, status post angioplasty. EXAM: MRI HEAD WITHOUT CONTRAST TECHNIQUE: Multiplanar, multiecho pulse sequences of the brain and surrounding structures were obtained without intravenous contrast. COMPARISON:  Prior MRI from 05/21/2014. FINDINGS: Brain: Advanced frontal and temporal lobe predominant cerebral atrophy seen,  mildly progressed relative to previous MRI from 2016. Patchy and confluent T2/FLAIR hyperintensity within the periventricular and deep white matter both cerebral hemispheres most consistent with chronic small vessel ischemic disease, also mildly progressed. Encephalomalacia within the parasagittal  right temporal occipital region consistent with chronic right PCA territory infarct. Associated chronic hemosiderin staining within this region. Additional tiny remote right cerebellar infarct noted. Subtle 9 mm focus of mild diffusion abnormality within the right cerebellar hemisphere, near the right middle cerebellar peduncle (series 2, image 13), likely sequelae of subacute ischemia. No associated hemorrhage or mass effect. No other evidence for acute or subacute infarct. Gray-white matter differentiation otherwise maintained. No other acute or chronic intracranial hemorrhage. No mass lesion, midline shift or mass effect. Diffuse ventricular prominence most likely related to global parenchymal volume loss, similar relative to previous exam. No extra-axial fluid collection. Pituitary gland within normal limits. Vascular: Major intracranial vascular flow voids maintained. Dominant left vertebral artery with hypoplastic right vertebral artery noted. Skull and upper cervical spine: Craniocervical junction within normal limits. Mild cervical spondylolysis at C3-4 without significant stenosis. Bone marrow signal intensity within normal limits. Scalp soft tissues unremarkable. Sinuses/Orbits: Globes and orbital soft tissues within normal limits. Paranasal sinuses are clear. Trace right mastoid effusion noted. Inner ear structures normal. Other: None. IMPRESSION: 1. 9 mm focus of subtle diffusion abnormality within the right cerebellar hemisphere, most likely a small subacute ischemic infarct. No associated hemorrhage or mass effect. Finding is felt to most likely be incidental in nature. 2. No other acute intracranial abnormality. 3. Chronic hemorrhagic right PCA territory infarct. 4. Moderate cerebral atrophy with chronic small vessel ischemic disease, mildly progressed relative to 2016. 5. Diffuse ventricular prominence most likely related to global parenchymal atrophy, similar to previous. Electronically Signed   By:  Rise Mu M.D.   On: 02/21/2018 03:19     ASSESSMENT AND PLAN:   77 year old male with past medical history of diabetes, abdominal aortic aneurysm, peripheral vascular disease who presented to the hospital due to right leg pain and claudication noted to have an ischemic right leg.  1.  Right leg ischemia-seen by vascular surgery and patient is status post right femoral thromboendarterectomy, femorofemoral and profunda embolectomy. -Appreciate vascular surgery input and patient is doing well from their standpoint.  - will d/c Heparin gtt and cont. Plavix, Eliquis for now.   2.  Sustained ventricular tachycardia- patient developed this postoperatively and was transferred to the intensive care unit.  Patient received boluses of amiodarone and episodes have stopped. Asymptomatic now.   - Electrolytes stable.  - Seen by cardiology and echocardiogram showing severe LV dysfunction with EF of 25% and they recommended possible ischemic work-up with cardiac catheterization but the patient is refusing/deferring at this time.    3.  COPD-no acute exacerbation-continue duo nebs as needed.  4.  Diabetes type 2 without complication- continue sliding scale insulin.  Follow blood sugars are stable. -Continue glimepiride.  5.  Tobacco abuse-continue nicotine patch.  Discussed with patient's sister who notified that patient's house is not unlivable condition and he has no running water.  Patient does not have living conditions that would allow him to go home with home health services.  He would benefit from going to short-term rehab presently.  He still a 2 person assist.  Social work is aware and is awaiting PASSR.   All the records are reviewed and case discussed with Care Management/Social Worker. Management plans discussed with the patient, family and they are in agreement.  CODE STATUS: Full code  DVT Prophylaxis: Eliquis  TOTAL TIME TAKING CARE OF THIS PATIENT: 30 minutes.   POSSIBLE  D/C IN 1-2 DAYS, DEPENDING ON CLINICAL CONDITION.   Houston Siren M.D on 02/22/2018 at 4:37 PM  Between 7am to 6pm - Pager - [phone]  After 6pm go to [email or web address] - Social research officer, government  [facility] [place] [facility]  Office  [phone]  CC: Primary care physician; Jaclyn Shaggy, MD

## 2018-02-22 NOTE — Progress Notes (Signed)
Pharmacy Electrolyte Monitoring Consult:  Pharmacy consulted to assist in monitoring and replacing electrolytes in this 77 y.o. male admitted on 02/17/2018 with ischemic foot. Patient admitted to ICU with ventricular tachycardia after rapid response called on 10/27.   Labs:  Sodium (mmol/L)  Date Value  02/22/2018 139  05/17/2014 139   Potassium (mmol/L)  Date Value  02/22/2018 4.0  05/17/2014 3.6   Magnesium (mg/dL)  Date Value  96/07/5407 1.6 (L)  05/15/2014 1.6 (L)   Phosphorus (mg/dL)  Date Value  81/19/1478 2.9  05/15/2014 2.1 (L)   Calcium (mg/dL)  Date Value  29/56/2130 9.0   Calcium, Total (mg/dL)  Date Value  86/57/8469 8.7   Albumin (g/dL)  Date Value  62/95/2841 3.4 (L)  05/15/2014 3.1 (L)    Assessment/Plan: K 4.0, Mag 1.6 Ordered Magnesium Sulf 2 gm IV once. Pharmacy will continue to monitor and adjust per consult.   Orinda Kenner, PharmD Clinical Pharmacist 02/22/2018 7:31 AM

## 2018-02-22 NOTE — Plan of Care (Signed)

## 2018-02-22 NOTE — Progress Notes (Signed)
Notified MD of blood sugar of 251. Orders placed. Will continue to monitor and assess.

## 2018-02-22 NOTE — Progress Notes (Addendum)
Physical Therapy Treatment Patient Details Name: Kurt [name] MRN: [id number] DOB: 07/17/1940 Today's Date: 02/22/2018    History of Present Illness Pt is a 77 y/o M s/p femoral to femoral bypass with continued occlusion shown on CT angiogram.  Pt with Vtach following procedure.  Pt declined further cardiac workup. No further plans for vascular intervention. Per girlfriend pt with forgetfulness and brain MRI scheduled for the outpatient setting following d/c.  Pt's PMH includes AAA.     PT Comments    Pt is progressing toward goals. He demonstrates improved ability to roll in bed and perform bed mobility requiring min A. Today's treatment focused on maintaining/improving BLE strength and decreasing posterior lean posturing in sitting and standing. Anterior-posterior pelvic tilts in sitting and standing helped to improve pt's ability to find neutral spine noted greatly reduced posterior lean and decreased to intermittent Min A to maintain stability. Baseline HR measures in mid-80s-90s. Standing increased pt's HR into 190-200s pt seated at EOB and HR returned to 90s and remained in low 90s as pt mobilized to supine in bed. Will continue to progress pt as able with mobility/balance/strength as able. Continue to recommend SNF upon dc from acute hospitalization.   Follow Up Recommendations  SNF     Equipment Recommendations  None recommended by PT    Recommendations for Other Services       Precautions / Restrictions Precautions Precautions: Fall;Other (comment) Precaution Comments: monitor HR Restrictions Weight Bearing Restrictions: No    Mobility  Bed Mobility Overal bed mobility: Needs Assistance Bed Mobility: Supine to Sit     Supine to sit: Min assist;HOB elevated     General bed mobility comments: Pt requires heavy use of bed rails has LOB posteriorly and requires Min A at trunk to maintain stability.  Transfers Overall transfer level: Needs assistance Equipment used:  Rolling walker (2 wheeled) Transfers: Sit to/from Stand Sit to Stand: Mod assist;From elevated surface         General transfer comment: Bed elevated and cues for proper hand placement and safe technique with boost to assist with standing as well as to prevent posterior LOB initially. In standing able to maintain without posterior lean for > 30 secs.  Ambulation/Gait Ambulation/Gait assistance: Min guard Gait Distance (Feet): 4 Feet Assistive device: Rolling walker (2 wheeled)   Gait velocity: decreased   General Gait Details: D/t recent HR elevation measured again prior to gait, measured 92 bpm. Pt performed side steps toward [facility] with fair foot clearance without posterior lean and CGA. Ambulation nly performed d/t pt unable to laterally scoot to [facility].   Stairs             Wheelchair Mobility    Modified Rankin (Stroke Patients Only)       Balance Overall balance assessment: Needs assistance;History of Falls Sitting-balance support: Bilateral upper extremity supported;Feet supported Sitting balance-Leahy Scale: Poor Sitting balance - Comments: Pt with intermittent LOB posterolaterally toward R sitting EOB. Could maintain for 10-15 seceonds with tacile cues at hip and BUE and BLE support. Postural control: Posterior lean Standing balance support: Bilateral upper extremity supported;During functional activity Standing balance-Leahy Scale: Poor Standing balance comment: Pt relies on BUE support can maintain 20 secs without physical assist, then requires physical assist to avoid LOB.                            Cognition Arousal/Alertness: Awake/alert Behavior During Therapy: WFL for tasks assessed/performed Overall  Cognitive Status: Within Functional Limits for tasks assessed                                        Exercises Other Exercises Other Exercises: Supine BLE AROM: ankle pumps, glute sets, heel slides, hip ABD/ADD and SLRs. All  ther-ex performed 12 reps with VC for technique. Other Exercises: Seated postural stability training at EOB for ~5 minutes, patient performed AP pelvic tilts to promote upright posturing and to reduce posterior lean. Other Exercises: Standing postural stability training, using RW for UE support. Pt performed AP pelvic tilts to find neutral spine and improve upright posturing. Noted improved balance in standing, only required CGA for safety. Other Exercises: Pre-gait standing marching in place. Performed 5 reps each LE.    General Comments General comments (skin integrity, edema, etc.): HR appropriate and stable with supine ther-ex, bed mobility, and seated at EOB. HR increased into concerning range with standing and pre-gait activity (190-200s). Pt returned to sitting at EOB and HR reduced to 90s and remained in 90s during transfer to bed.      Pertinent Vitals/Pain Pain Assessment: No/denies pain    Home Living                      Prior Function            PT Goals (current goals can now be found in the care plan section) Acute Rehab PT Goals Patient Stated Goal: to return to PLOF PT Goal Formulation: With patient Time For Goal Achievement: 03/07/18 Potential to Achieve Goals: Good Progress towards PT goals: Progressing toward goals    Frequency    Min 2X/week      PT Plan Current plan remains appropriate    Co-evaluation              AM-PAC PT "6 Clicks" Daily Activity  Outcome Measure  Difficulty turning over in bed (including adjusting bedclothes, sheets and blankets)?: A Lot Difficulty moving from lying on back to sitting on the side of the bed? : Unable Difficulty sitting down on and standing up from a chair with arms (e.g., wheelchair, bedside commode, etc,.)?: Unable Help needed moving to and from a bed to chair (including a wheelchair)?: A Little Help needed walking in hospital room?: A Lot Help needed climbing 3-5 steps with a railing? :  Total 6 Click Score: 10    End of Session Equipment Utilized During Treatment: Gait belt Activity Tolerance: Treatment limited secondary to medical complications (Comment)(HR increased to concerning range (190-200s)) Patient left: in bed;with call bell/phone within reach;with bed alarm set Nurse Communication: Mobility status;Other (comment)(RN notified about bleeding at LUE IV lead) PT Visit Diagnosis: Unsteadiness on feet (R26.81);Difficulty in walking, not elsewhere classified (R26.2);History of falling (Z91.81);Other abnormalities of gait and mobility (R26.89)     Time: 1610-9604 PT Time Calculation (min) (ACUTE ONLY): 34 min  Charges:  $Therapeutic Exercise: 8-22 mins $Therapeutic Activity: 8-22 mins                     Arvilla Meres, SPT 02/22/2018, 2:44 PM

## 2018-02-22 NOTE — Progress Notes (Addendum)
[facility] Cardiology  SUBJECTIVE: [name] is a 77 year old male who is s/p right common femoral artery thromboendarterectomy, embolectomy of the left to right femoral-femoral bypass, and embolectomy of right profunda femoris artery with Dr. Wyn [name] on 02/17/18.  Had a 16 run episode of Vtach and was started on IV amiodarone. That has been discontinued.  Today, [name] is sitting up comfortably in bed. Continues to deny further cardiac workup.  Denies chest pain, shortness of breath, or palpitations.  Currently in no pain and is doing well post-operatively.  States that he will likely be discharged to a skilled nursing facility.    Vitals:   02/21/18 0802 02/21/18 1650 02/21/18 1925 02/22/18 0518  BP: (!) 149/80 (!) 151/84 (!) 160/87 (!) 150/78  Pulse: 78 85 (!) 101 85  Resp: 18 18 18    Temp: 98.5 F (36.9 C) 98.4 F (36.9 C) 97.6 F (36.4 C) 98.3 F (36.8 C)  TempSrc: Oral Oral Oral Oral  SpO2: 96% 100% 100% 93%  Weight:      Height:         Intake/Output Summary (Last 24 hours) at 02/22/2018 0805 Last data filed at 02/21/2018 1955 Gross per 24 hour  Intake -  Output 1780 ml  Net -1780 ml      PHYSICAL EXAM  General: Well developed, well nourished, in no acute distress HEENT:  Normocephalic and atramatic Neck:  No JVD.  Lungs: Clear bilaterally to auscultation and percussion. Heart: HRRR . Normal S1 and S2 without gallops or murmurs.  Abdomen: Bowel sounds are positive, abdomen soft and non-tender  Msk:  Back normal.  Gait not assessed. Normal strength and tone for age. Extremities: No clubbing, cyanosis or edema.   Neuro: Alert and oriented X 3. Psych:  Good affect, responds appropriately   LABS: Basic Metabolic Panel: Recent Labs    02/20/18 0618  02/21/18 0621 02/22/18 0057  NA 144  --  140 139  K 4.2  --  3.6 4.0  CL 106  --  107 106  CO2 27  --  24 24  GLUCOSE 141*  --  164* 149*  BUN 15  --  17 19  CREATININE 1.10  --  1.00 1.15  CALCIUM 9.1  --  8.8* 9.0   MG 1.7   < > 1.9 1.6*  PHOS 2.9  --   --   --    < > = values in this interval not displayed.   Liver Function Tests: Recent Labs    02/19/18 1830  AST 40  ALT 22  ALKPHOS 65  BILITOT 0.6  PROT 6.3*  ALBUMIN 3.4*   No results for input(s): LIPASE, AMYLASE in the last 72 hours. CBC: Recent Labs    02/21/18 0621 02/22/18 0057  WBC 5.8 6.2  HGB 11.4* 11.4*  HCT 32.9* 33.3*  MCV 95.6 94.9  PLT 194 210   Cardiac Enzymes: No results for input(s): CKTOTAL, CKMB, CKMBINDEX, TROPONINI in the last 72 hours. BNP: Invalid input(s): POCBNP D-Dimer: No results for input(s): DDIMER in the last 72 hours. Hemoglobin A1C: No results for input(s): HGBA1C in the last 72 hours. Fasting Lipid Panel: No results for input(s): CHOL, HDL, LDLCALC, TRIG, CHOLHDL, LDLDIRECT in the last 72 hours. Thyroid Function Tests: No results for input(s): TSH, T4TOTAL, T3FREE, THYROIDAB in the last 72 hours.  Invalid input(s): FREET3 Anemia Panel: No results for input(s): VITAMINB12, FOLATE, FERRITIN, TIBC, IRON, RETICCTPCT in the last 72 hours.  [name]  Result Date: 02/21/2018 CLINICAL DATA:  Initial evaluation for generalized muscle weakness. History of right lower extremity chronic bow CIS, status post angioplasty. EXAM: MRI HEAD WITHOUT CONTRAST TECHNIQUE: Multiplanar, multiecho pulse sequences of the brain and surrounding structures were obtained without intravenous contrast. COMPARISON:  Prior MRI from 05/21/2014. FINDINGS: Brain: Advanced frontal and temporal lobe predominant cerebral atrophy seen, mildly progressed relative to previous MRI from 2016. Patchy and confluent T2/FLAIR hyperintensity within the periventricular and deep white matter both cerebral hemispheres most consistent with chronic small vessel ischemic disease, also mildly progressed. Encephalomalacia within the parasagittal right temporal occipital region consistent with chronic right PCA territory infarct. Associated  chronic hemosiderin staining within this region. Additional tiny remote right cerebellar infarct noted. Subtle 9 mm focus of mild diffusion abnormality within the right cerebellar hemisphere, near the right middle cerebellar peduncle (series 2, image 13), likely sequelae of subacute ischemia. No associated hemorrhage or mass effect. No other evidence for acute or subacute infarct. Gray-white matter differentiation otherwise maintained. No other acute or chronic intracranial hemorrhage. No mass lesion, midline shift or mass effect. Diffuse ventricular prominence most likely related to global parenchymal volume loss, similar relative to previous exam. No extra-axial fluid collection. Pituitary gland within normal limits. Vascular: Major intracranial vascular flow voids maintained. Dominant left vertebral artery with hypoplastic right vertebral artery noted. Skull and upper cervical spine: Craniocervical junction within normal limits. Mild cervical spondylolysis at C3-4 without significant stenosis. Bone marrow signal intensity within normal limits. Scalp soft tissues unremarkable. Sinuses/Orbits: Globes and orbital soft tissues within normal limits. Paranasal sinuses are clear. Trace right mastoid effusion noted. Inner ear structures normal. Other: None. IMPRESSION: 1. 9 mm focus of subtle diffusion abnormality within the right cerebellar hemisphere, most likely a small subacute ischemic infarct. No associated hemorrhage or mass effect. Finding is felt to most likely be incidental in nature. 2. No other acute intracranial abnormality. 3. Chronic hemorrhagic right PCA territory infarct. 4. Moderate cerebral atrophy with chronic small vessel ischemic disease, mildly progressed relative to 2016. 5. Diffuse ventricular prominence most likely related to global parenchymal atrophy, similar to previous. Electronically Signed   By: Rise Mu M.D.   On: 02/21/2018 03:19     ASSESSMENT AND PLAN:  Active  Problems:   Ischemic foot   Pressure injury of skin    1.  Episode of vtach/elevated troponin   -Patient has deferred further cardiac workup  -Stressed the importance of following up with Dr. Juliann Pares upon discharge   2.  Ischemic foot  -Doing well postoperatively; will likely be discharged to skilled nursing facility    Andi Hence, PA-C 02/22/2018 8:05 AM   Agree with above.

## 2018-02-23 ENCOUNTER — Encounter
Admission: RE | Admit: 2018-02-23 | Discharge: 2018-02-23 | Disposition: A | Discharge status: Home / Self Care | Payer: Medicare Other | Source: Ambulatory Visit | Attending: Internal Medicine | Admitting: Internal Medicine

## 2018-02-23 LAB — GLUCOSE, CAPILLARY
Glucose-Capillary: 154 mg/dL — ABNORMAL HIGH (ref 70–99)
Glucose-Capillary: 146 mg/dL — ABNORMAL HIGH (ref 70–99)

## 2018-02-23 LAB — BASIC METABOLIC PANEL
Anion gap: 10 (ref 5–15)
BUN: 20 mg/dL (ref 8–23)
CALCIUM: 9.1 mg/dL (ref 8.9–10.3)
CO2: 24 mmol/L (ref 22–32)
CREATININE: 1.06 mg/dL (ref 0.61–1.24)
Chloride: 105 mmol/L (ref 98–111)
GFR calc Af Amer: >60 mL/min (ref >60)
GFR calc non Af Amer: >60 mL/min (ref >60)
Glucose, Bld: 162 mg/dL — ABNORMAL HIGH (ref 70–99)
Potassium: 4.3 mmol/L (ref 3.5–5.1)
Sodium: 139 mmol/L (ref 135–145)

## 2018-02-23 MED ORDER — ATORVASTATIN CALCIUM 40 MG PO TABS: 40.0000 mg | ORAL_TABLET | Freq: Every day | ORAL | 0 refills | Status: DC

## 2018-02-23 MED ORDER — APIXABAN 5 MG PO TABS: 5.0000 mg | ORAL_TABLET | Freq: Two times a day (BID) | ORAL | 0 refills | Status: DC

## 2018-02-23 MED ORDER — TRAMADOL HCL 50 MG PO TABS: 50.0000 mg | ORAL_TABLET | Freq: Four times a day (QID) | ORAL | 3 refills | Status: DC | PRN

## 2018-02-23 MED ORDER — NICOTINE 21 MG/24HR TD PT24: 21.0000 mg | MEDICATED_PATCH | Freq: Every day | TRANSDERMAL | 0 refills | Status: DC

## 2018-02-23 NOTE — Care Management Important Message (Signed)
Copy of signed IM left with patient in room.  

## 2018-02-23 NOTE — Progress Notes (Signed)
Pharmacy Electrolyte Monitoring Consult:  Pharmacy consulted to assist in monitoring and replacing electrolytes in this 77 y.o. male admitted on 02/17/2018 with ischemic foot. Patient admitted to ICU with ventricular tachycardia after rapid response called on 10/27.   Labs:  Sodium (mmol/L)  Date Value  02/23/2018 139  05/17/2014 139   Potassium (mmol/L)  Date Value  02/23/2018 4.3  05/17/2014 3.6   Magnesium (mg/dL)  Date Value  65/78/4696 1.6 (L)  05/15/2014 1.6 (L)   Phosphorus (mg/dL)  Date Value  29/52/8413 2.9  05/15/2014 2.1 (L)   Calcium (mg/dL)  Date Value  24/40/1027 9.1   Calcium, Total (mg/dL)  Date Value  25/36/6440 8.7   Albumin (g/dL)  Date Value  34/74/2595 3.4 (L)  05/15/2014 3.1 (L)    Assessment/Plan: Labs WNL. Pharmacy will sign off on electrolyte management at this time.  If additional management is needed please let us know.  Thank you for allowing pharmacy to be apart of this patient's care.  Orinda Kenner, PharmD Clinical Pharmacist 02/23/2018 8:45 AM

## 2018-02-23 NOTE — Discharge Summary (Addendum)
[facility] - [place] at [facility]   PATIENT NAME: Kurt [name]    MR#:  [id number]  DATE OF BIRTH:  [birth year]  DATE OF ADMISSION:  02/17/2018 ADMITTING PHYSICIAN: Enedina Finner, MD  DATE OF DISCHARGE: 02/23/2018   PRIMARY CARE PHYSICIAN: Jaclyn Shaggy, MD    ADMISSION DIAGNOSIS:  Ischemia of lower extremity [I99.8]  DISCHARGE DIAGNOSIS:  Active Problems:   Ischemic foot   Pressure injury of skin   SECONDARY DIAGNOSIS:   Past Medical History:  Diagnosis Date  . AAA (abdominal aortic aneurysm) (HCC)   . Diabetes mellitus without complication [facility])     HOSPITAL COURSE:   77 year old male with past medical history of diabetes, abdominal aortic aneurysm, peripheral vascular disease who presented to the hospital due to right leg pain and claudication noted to have an ischemic right leg.  1.  Right leg ischemia-seen by vascular surgery and patient is status post right femoral thromboendarterectomy, femorofemoral and profunda embolectomy. -Appreciate vascular surgery input and patient is doing well from their standpoint.  - will d/c Heparin gtt and cont. Plavix, Eliquis for now.   2.  Sustained ventricular tachycardia- patient developed this postoperatively and was transferred to the intensive care unit.  Patient received boluses of amiodarone and episodes have stopped. Asymptomatic now.   - Electrolytes stable.  - Seen by cardiology and echocardiogram showing severe LV dysfunction with EF of 25% and they recommended possible ischemic work-up with cardiac catheterization but the patient is refusing/deferring at this time.   - may follow in clinic with Dr. Lady Gary, if wants cardiac work ups in future.  3.  COPD-no acute exacerbation-continue duo nebs as needed.  4.  Diabetes type 2 without complication- continue sliding scale insulin.  Follow blood sugars are stable. -Continue glimepiride.  5.  Tobacco abuse-continue nicotine patch.  Discussed  with patient's sister who notified that patient's house is not unlivable condition and he has no running water.  Patient does not have living conditions that would allow him to go home with home health services.  He would benefit from going to short-term rehab presently.  He still a 2 person assist.   DISCHARGE CONDITIONS:   Stable.  CONSULTS OBTAINED:  Treatment Team:  Annice Needy, MD Uvaldo Rising, MD  DRUG ALLERGIES:  No Known Allergies  DISCHARGE MEDICATIONS:   Allergies as of 02/23/2018   No Known Allergies     Medication List    STOP taking these medications   aspirin EC 81 MG tablet   metFORMIN 1000 MG tablet Commonly known as:  GLUCOPHAGE     TAKE these medications   apixaban 5 MG Tabs tablet Commonly known as:  ELIQUIS Take 1 tablet (5 mg total) by mouth 2 (two) times daily.   atorvastatin 40 MG tablet Commonly known as:  LIPITOR Take 1 tablet (40 mg total) by mouth daily at 6 PM.   clopidogrel 75 MG tablet Commonly known as:  PLAVIX Take 75 mg by mouth daily.   co-enzyme Q-10 30 MG capsule Take 30 mg by mouth 3 (three) times daily.   Coconut Oil 1000 MG Caps Take 1,000 mg by mouth as needed (dry skin).   D-5000 5000 units Tabs Generic drug:  Cholecalciferol Take 5,000 Units by mouth daily.   docusate sodium 100 MG capsule Commonly known as:  COLACE Take 100 mg by mouth 2 (two) times daily.   donepezil 5 MG tablet Commonly known as:  ARICEPT Take 5 mg by mouth at  bedtime.   gabapentin 300 MG capsule Commonly known as:  NEURONTIN Take 300 mg by mouth daily as needed (Pain).   GARLIC OIL PO Take 1 capsule by mouth daily as needed (cholesterol).   glimepiride 2 MG tablet Commonly known as:  AMARYL Take 2 mg by mouth daily with breakfast.   nicotine 21 mg/24hr patch Commonly known as:  NICODERM CQ - dosed in mg/24 hours Place 1 patch (21 mg total) onto the skin daily. Start taking on:  02/24/2018   PROSTATE THERAPY COMPLEX PO Take 562  mg by mouth daily.   Red Yeast Rice 600 MG Caps Take 1 capsule by mouth daily.   traMADol 50 MG tablet Commonly known as:  ULTRAM Take 1 tablet (50 mg total) by mouth every 6 (six) hours as needed for moderate pain.   vitamin B-12 1000 MCG tablet Commonly known as:  CYANOCOBALAMIN Take 1,000 mcg by mouth daily.        DISCHARGE INSTRUCTIONS:    Follow with Vascular clinic in 2 weeks.  If you experience worsening of your admission symptoms, develop shortness of breath, life threatening emergency, suicidal or homicidal thoughts you must seek medical attention immediately by calling 911 or calling your MD immediately  if symptoms less severe.  You Must read complete instructions/literature along with all the possible adverse reactions/side effects for all the Medicines you take and that have been prescribed to you. Take any new Medicines after you have completely understood and accept all the possible adverse reactions/side effects.   Please note  You were cared for by a hospitalist during your hospital stay. If you have any questions about your discharge medications or the care you received while you were in the hospital after you are discharged, you can call the unit and asked to speak with the hospitalist on call if the hospitalist that took care of you is not available. Once you are discharged, your primary care physician will handle any further medical issues. Please note that NO REFILLS for any discharge medications will be authorized once you are discharged, as it is imperative that you return to your primary care physician (or establish a relationship with a primary care physician if you do not have one) for your aftercare needs so that they can reassess your need for medications and monitor your lab values.    Today   CHIEF COMPLAINT:   Chief Complaint  Patient presents with  . Fall  . Weakness    HISTORY OF PRESENT ILLNESS:  Kurt [name]  is a 77 y.o. male with a  known history of peripheral arterial disease status post femoral to femoral bypass in the past, stent placement, history of abdominal aortic aneurysm, diabetes comes to the emergency room with ongoing right leg pain more than left. He had a fall couple days ago. Patient continues to smoke heavily. Patient underwent CT angiogram shows occlusion of his femoral to femoral bypass. Dr. Wyn Quaker vascular surgery so put patient in the ER and recommended place on IV heparin drip with possible surgical intervention during the hospitalization  Patient is being admitted for right ischemic foot   VITAL SIGNS:  Blood pressure 134/78, pulse 81, temperature 97.6 F (36.4 C), temperature source Oral, resp. rate 18, height 6\' 3"  (1.905 m), weight 83.4 kg, SpO2 97 %.  I/O:    Intake/Output Summary (Last 24 hours) at 02/23/2018 1522 Last data filed at 02/23/2018 1447 Gross per 24 hour  Intake 1200 ml  Output 750 ml  Net  450 ml    PHYSICAL EXAMINATION:   GENERAL:  77 y.o.-year-old patient lying in bed in NAD.  EYES: Pupils equal, round, reactive to light and accommodation. No scleral icterus. Extraocular muscles intact.  HEENT: Head atraumatic, normocephalic. Oropharynx and nasopharynx clear.  NECK:  Supple, no jugular venous distention. No thyroid enlargement, no tenderness.  LUNGS: Normal breath sounds bilaterally, no wheezing, rales, rhonchi. No use of accessory muscles of respiration.  CARDIOVASCULAR: S1, S2 normal. No murmurs, rubs, or gallops.  ABDOMEN: Soft, nontender, nondistended. Bowel sounds present. No organomegaly or mass.  EXTREMITIES: No cyanosis, clubbing or edema b/l.    NEUROLOGIC: Cranial nerves II through XII are intact. No focal Motor or sensory deficits b/l.   PSYCHIATRIC: The patient is alert and oriented x 3.  SKIN: No obvious rash, lesion, or ulcer  DATA REVIEW:   CBC Recent Labs  Lab 02/22/18 0057  WBC 6.2  HGB 11.4*  HCT 33.3*  PLT 210    Chemistries  Recent Labs   Lab 02/19/18 1830  02/22/18 0057 02/23/18 0746  NA 138   < > 139 139  K 3.8   < > 4.0 4.3  CL 107   < > 106 105  CO2 26   < > 24 24  GLUCOSE 92   < > 149* 162*  BUN 17   < > 19 20  CREATININE 1.01   < > 1.15 1.06  CALCIUM 9.2   < > 9.0 9.1  MG 1.7   < > 1.6*  --   AST 40  --   --   --   ALT 22  --   --   --   ALKPHOS 65  --   --   --   BILITOT 0.6  --   --   --    < > = values in this interval not displayed.    Cardiac Enzymes Recent Labs  Lab 02/18/18 2324  TROPONINI 0.08*    Microbiology Results  Results for orders placed or performed during the hospital encounter of 02/17/18  MRSA PCR Screening     Status: None   Collection Time: 02/18/18 11:17 PM  Result Value Ref Range Status   MRSA by PCR NEGATIVE NEGATIVE Final    Comment:        The GeneXpert MRSA Assay (FDA approved for NASAL specimens only), is one component of a comprehensive MRSA colonization surveillance program. It is not intended to diagnose MRSA infection nor to guide or monitor treatment for MRSA infections. Performed at [facility], [address], [place], [place] [address]     RADIOLOGY:  No results found.  EKG:   Orders placed or performed during the hospital encounter of 02/17/18  . ED EKG  . ED EKG  . EKG 12-Lead  . EKG 12-Lead  . EKG 12-Lead  . EKG 12-Lead  . EKG 12-Lead  . EKG 12-Lead  . EKG 12-Lead  . EKG 12-Lead      Management plans discussed with the patient, family and they are in agreement.  CODE STATUS:     Code Status Orders  (From admission, onward)         Start     Ordered   02/17/18 1555  Full code  Continuous     02/17/18 1554        Code Status History    This patient has a current code status but no historical code status.  TOTAL TIME TAKING CARE OF THIS PATIENT: 35 minutes.    Altamese Dilling M.D on 02/23/2018 at 3:22 PM  Between 7am to 6pm - Pager - [phone]  After 6pm go to [email or web address] - password [name]  [facility]  Office  [phone]  CC: Primary care physician; Jaclyn Shaggy, MD   Note: This dictation was prepared with Dragon dictation along with smaller phrase technology. Any transcriptional errors that result from this process are unintentional.

## 2018-02-23 NOTE — Clinical Social Work Note (Addendum)
Patient to be d/c'ed today to [facility].  Patient and family agreeable to plans will transport via ems RN to call report to [phone], [phone].  Windell Moulding, MSW, Theresia Majors [phone]

## 2018-02-23 NOTE — Progress Notes (Signed)
Patient is to discharge to [facility] place, [facility], RN called report to receiving facility. Discharge packet was sent to EMS. Discontinue telemetry monitor and peripheral IV. EMS is here to pick up patient.   Patient states that he was missing his wallet, checked and stripped his belongings, it was not there. Called family member and a friend that was with him when he got here in the ED and they say they also don't have it. Checked the admission documentation about patient belongings and it was not included in his belongings nor it was sent in the security. RN will follow up about this issue.

## 2018-02-23 NOTE — Progress Notes (Signed)
Patient Name: Kurt [name] Date of Encounter: 02/23/2018  Hospital Problem List     Active Problems:   Ischemic foot   Pressure injury of skin    Patient Profile     Kurt [name] is a 77 year old male who is s/pright common femoral artery thromboendarterectomy, embolectomy of the left to right femoral-femoral bypass, and embolectomy of right profunda femoris artery with Dr. Wyn Quaker on 02/17/18. Had run of wide complex tachycardia with mild troponin elevation. No chest pain. Has deffered cardiac cath.   Subjective   No cardiac complaints. Feels weak.  Episodes of tachycardia and bradycardia.   Inpatient Medications    . apixaban  5 mg Oral BID  . atorvastatin  40 mg Oral q1800  . cholecalciferol  5,000 Units Oral Daily  . clopidogrel  75 mg Oral Daily  . docusate sodium  100 mg Oral BID  . donepezil  5 mg Oral QHS  . glimepiride  2 mg Oral Q breakfast  . insulin aspart  0-5 Units Subcutaneous QHS  . insulin aspart  0-9 Units Subcutaneous TID WC  . nicotine  21 mg Transdermal Daily  . triamcinolone cream   Topical BID  . vitamin B-12  1,000 mcg Oral Daily    Vital Signs    Vitals:   02/22/18 0810 02/22/18 1532 02/22/18 2038 02/23/18 0520  BP: (!) 152/79 139/68 138/75 (!) 143/73  Pulse: 86 74 95 77  Resp: 19 19  18   Temp: 98.1 F (36.7 C) 97.9 F (36.6 C) 98.3 F (36.8 C) 98 F (36.7 C)  TempSrc: Oral Oral Oral Oral  SpO2: 96% 100% 96% 96%  Weight:      Height:        Intake/Output Summary (Last 24 hours) at 02/23/2018 0801 Last data filed at 02/23/2018 0100 Gross per 24 hour  Intake 1271.73 ml  Output 200 ml  Net 1071.73 ml   Filed Weights   02/17/18 0931 02/20/18 2050  Weight: 83 kg 83.4 kg    Physical Exam    GEN: Well nourished, well developed, in no acute distress.  HEENT: normal.  Neck: Supple, no JVD, carotid bruits, or masses. Cardiac: RRR, no murmurs, rubs, or gallops. No clubbing, cyanosis, edema.  Radials/DP/PT 2+ and equal bilaterally.   Respiratory:  Respirations regular and unlabored, clear to auscultation bilaterally. GI: Soft, nontender, nondistended, BS + x 4. MS: no deformity or atrophy. Skin: warm and dry, no rash. Neuro:  Strength and sensation are intact. Psych: Normal affect.  Labs    CBC Recent Labs    02/21/18 0621 02/22/18 0057  WBC 5.8 6.2  HGB 11.4* 11.4*  HCT 32.9* 33.3*  MCV 95.6 94.9  PLT 194 210   Basic Metabolic Panel Recent Labs    16/10/96 0621 02/22/18 0057  NA 140 139  K 3.6 4.0  CL 107 106  CO2 24 24  GLUCOSE 164* 149*  BUN 17 19  CREATININE 1.00 1.15  CALCIUM 8.8* 9.0  MG 1.9 1.6*   Liver Function Tests No results for input(s): AST, ALT, ALKPHOS, BILITOT, PROT, ALBUMIN in the last 72 hours. No results for input(s): LIPASE, AMYLASE in the last 72 hours. Cardiac Enzymes No results for input(s): CKTOTAL, CKMB, CKMBINDEX, TROPONINI in the last 72 hours. BNP No results for input(s): BNP in the last 72 hours. D-Dimer No results for input(s): DDIMER in the last 72 hours. Hemoglobin A1C No results for input(s): HGBA1C in the last 72 hours. Fasting Lipid Panel  No results for input(s): CHOL, HDL, LDLCALC, TRIG, CHOLHDL, LDLDIRECT in the last 72 hours. Thyroid Function Tests No results for input(s): TSH, T4TOTAL, T3FREE, THYROIDAB in the last 72 hours.  Invalid input(s): FREET3  Telemetry    Sinus rhythm with first degree av block. Intermittent second degree heart block. Asymptomatic  ECG    Sinus rhythm with 1st degree av block.   Radiology    [name]  Result Date: 02/21/2018 CLINICAL DATA:  Initial evaluation for generalized muscle weakness. History of right lower extremity chronic bow CIS, status post angioplasty. EXAM: MRI HEAD WITHOUT CONTRAST TECHNIQUE: Multiplanar, multiecho pulse sequences of the brain and surrounding structures were obtained without intravenous contrast. COMPARISON:  Prior MRI from 05/21/2014. FINDINGS: Brain: Advanced frontal  and temporal lobe predominant cerebral atrophy seen, mildly progressed relative to previous MRI from 2016. Patchy and confluent T2/FLAIR hyperintensity within the periventricular and deep white matter both cerebral hemispheres most consistent with chronic small vessel ischemic disease, also mildly progressed. Encephalomalacia within the parasagittal right temporal occipital region consistent with chronic right PCA territory infarct. Associated chronic hemosiderin staining within this region. Additional tiny remote right cerebellar infarct noted. Subtle 9 mm focus of mild diffusion abnormality within the right cerebellar hemisphere, near the right middle cerebellar peduncle (series 2, image 13), likely sequelae of subacute ischemia. No associated hemorrhage or mass effect. No other evidence for acute or subacute infarct. Gray-white matter differentiation otherwise maintained. No other acute or chronic intracranial hemorrhage. No mass lesion, midline shift or mass effect. Diffuse ventricular prominence most likely related to global parenchymal volume loss, similar relative to previous exam. No extra-axial fluid collection. Pituitary gland within normal limits. Vascular: Major intracranial vascular flow voids maintained. Dominant left vertebral artery with hypoplastic right vertebral artery noted. Skull and upper cervical spine: Craniocervical junction within normal limits. Mild cervical spondylolysis at C3-4 without significant stenosis. Bone marrow signal intensity within normal limits. Scalp soft tissues unremarkable. Sinuses/Orbits: Globes and orbital soft tissues within normal limits. Paranasal sinuses are clear. Trace right mastoid effusion noted. Inner ear structures normal. Other: None. IMPRESSION: 1. 9 mm focus of subtle diffusion abnormality within the right cerebellar hemisphere, most likely a small subacute ischemic infarct. No associated hemorrhage or mass effect. Finding is felt to most likely be  incidental in nature. 2. No other acute intracranial abnormality. 3. Chronic hemorrhagic right PCA territory infarct. 4. Moderate cerebral atrophy with chronic small vessel ischemic disease, mildly progressed relative to 2016. 5. Diffuse ventricular prominence most likely related to global parenchymal atrophy, similar to previous. Electronically Signed   By: Rise Mu M.D.   On: 02/21/2018 03:19   Ct Angio Ao+bifem W & Or Wo Contrast  Result Date: 02/17/2018 CLINICAL DATA:  77 y.o. male the past medical history of AAA status post repair, diabetes, intermittent claudication, presents to the emergency department for lower extremity weakness. According to the patient he states he fell 2 days ago, was having some right hip pain but states right hip pain has since gone away. This morning he got up out of bed and felt that he could not walk on his legs. States they both felt very weak and he was unable to get up. He states normally he ambulates without any assistance although has a walker but states he rarely uses it. Patient does state pain in the right lower extremity near his foot. Largely negative review of systems otherwise. EXAM: CT ANGIOGRAPHY AOBIFEM WITHOUT AND WITH CONTRAST TECHNIQUE: CT imaging performed through the  abdomen and pelvis and continued through the lower extremities during and following the dynamic enhancement of intravenous contrast with coronal and sagittal MPR and MIP reformatted images also acquired. CONTRAST:  ISOVUE-370 IOPAMIDOL (ISOVUE-370) INJECTION 76% COMPARISON:  04/04/2014 FINDINGS: CTA FINDINGS Aorta: Native aorta distended to a maximum of 3.8 x 3.3 cm. Aneurysm has been excluded with a stent. Stent proximal aspect lies just below the origins of the renal arteries. It extends to the mid aorta where 1 of the 2 limbs is occluded. The patent limb below the bifurcation. There is significant mural thrombus that begins in the aorta above the stent becoming thicker and  circumferential within the stent, where the lumen is narrowed to 13 mm. There is no evidence of aortic rupture or of an endoleak. Celiac axis: Partly calcified plaque at the origin. No hemodynamically significant stenosis. SMA: Partly calcified plaque at the origin.  No stenosis. Renal arteries: Dual renal arteries on the left. There is also a small accessory branch on the right to the upper pole. Mild plaque noted at the origin of the renal arteries with no hemodynamically significant stenosis. IMA: Occluded. Common, internal and external iliac arteries: Right common iliac artery is occluded. There is a metal vascular coil at the bifurcation of the right common iliac. External iliac artery on the right is partly reconstituted distally just above the common femoral artery. Right internal iliac artery is reconstituted in the inferior pelvis. There is dilation of the left common iliac artery to 18 mm. A stent extends through the left external iliac artery. No significant stenosis of the common iliac artery. Internal iliac artery on the left is occluded but reconstituted distally. External iliac artery on the left shows atherosclerotic plaque without significant stenosis. Right lower extremity: Common iliac artery is severely narrowed. There is an occluded by femoral graft crossing the subcutaneous soft tissues just above the pubic symphysis. Significant plaque noted throughout a moderate to severely narrowed femoral artery. Popliteal artery is occluded. No visible enhancement of the trifurcation vessels or of arteries in the right foot. Left lower extremity: Atherosclerosis of the common femoral artery with no hemodynamically significant stenosis. Femoral artery occluded at its origin. Atherosclerotic plaque noted along the deep femoral artery without significant stenosis. This provides collateralized flow to the popliteal artery which shows atherosclerotic narrowing, but no occlusion. Flow is seen into the  trifurcation vessels, most prominently the perineal artery with minimal flow noted to the anterior tibial artery. Posterior tibial artery is partly reconstituted above the ankle, occluded proximally. Minimal flow is seen into the left foot. NON CTA FINDINGS Lung bases: No acute findings. Hepatic biliary: Gallbladder is distended with wall thickening and mild adjacent inflammatory haziness. No discrete gallstones. No bile duct dilation. Liver is unremarkable. Pancreas: No mass or inflammation. Spleen: Normal in size.  No mass or focal lesion. Adrenal glands: Mixed attenuation left adrenal mass measuring 2.9 x 2.1 x 2.6 cm with small areas of macroscopic fat and small calcifications. This is unchanged in size from the prior CT. Normal right adrenal gland. Kidneys, ureters, bladder: Symmetric renal enhancement. Kidneys normal in size orientation and position. Low-density bilobed mass mid to lower pole of the right kidney, consistent with a cyst. Subcentimeter low-density mass, left lower pole anterior cortex consistent with a cyst. These are similar to the prior CT. No stones or hydronephrosis. Normal ureters. Normal bladder. Gastrointestinal: Stomach is unremarkable. No bowel dilation, wall thickening or inflammation. Normal appendix visualized. Rectum moderately distended with stool. Lymphatic: No pathologically  enlarged lymph nodes. Other: No ascites.  No hernia. Musculoskeletal: No fracture or acute finding. No osteoblastic or osteolytic lesions. Review of the MIP images confirms the above findings. IMPRESSION: CTA 1. Status post aortic aneurysm repair. No evidence of aneurysm rupture or of an endoleak. 2. Significant mural thrombus within the abdominal aorta at the level of the stent, without hemodynamically significant stenosis. 3. Chronically occluded right common iliac artery. Thrombosis bifemoral graft. 4. Occluded left femoral artery. Popliteal and calf vessels are reconstituted from the deep left femoral  artery. 5. Significant atherosclerotic narrowing of the right common femoral artery and femoral artery with occlusion of the popliteal artery and trifurcation vessels. NON CTA FINDINGS 1. Gallbladder wall distension all wall thickening and adjacent inflammation suggests acute cholecystitis in the proper clinical setting. Consider follow-up assessment with right upper quadrant ultrasound. 2. No other acute abnormality within the abdomen or pelvis. 3. 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 Electronically Signed   By: Amie Portland M.D.   On: 02/17/2018 12:43    Assessment & Plan    VT/elevated troponin-Pt deferred invasive evaluaitin. No chest pain at present. No further ischemic symptoms. No further wide complex tachycardia. Remain off of amiodarone for now. Continue with atorvastatin.   Heart block-has transient second degree heart block. No pauses. Asymptomatic. WIll follow. As outpatient.   Ischemic foot-stable post op. Will likely need snf after discharge. Continue with eliquis/plavix. NO asa  Signed, Darlin Priestly. Finbar Nippert MD 02/23/2018, 8:01 AM  Pager: [phone]

## 2018-02-23 NOTE — Care Management (Signed)
Care management consult was ordered for home health needs.  Patient will be discharging to [facility] when medically stable.  No needs identified by [facility] at this time.

## 2018-02-23 NOTE — Progress Notes (Signed)
Per [place] admissions coordinator at [facility] authorization has been received. Patient can D/C to [facility] when medically stable.   Anner Baity, LCSW [phone]   

## 2018-02-23 NOTE — Plan of Care (Signed)
  Problem: Pain Managment: Goal: General experience of comfort will improve Outcome: Progressing   Problem: Safety: Goal: Ability to remain free from injury will improve Outcome: Progressing   

## 2018-02-23 NOTE — Plan of Care (Signed)

## 2018-02-24 ENCOUNTER — Ambulatory Visit: Payer: Medicare Other

## 2018-02-25 ENCOUNTER — Emergency Department: Payer: Medicare Other

## 2018-02-25 ENCOUNTER — Emergency Department
Admission: EM | Admit: 2018-02-25 | Discharge: 2018-02-26 | Disposition: A | Discharge status: Skilled Nursing Facility | Payer: Medicare Other | Attending: Emergency Medicine | Admitting: Emergency Medicine

## 2018-02-25 ENCOUNTER — Other Ambulatory Visit: Payer: Self-pay

## 2018-02-25 DIAGNOSIS — F17290 Nicotine dependence, other tobacco product, uncomplicated: Secondary | ICD-10-CM | POA: Insufficient documentation

## 2018-02-25 DIAGNOSIS — M79672 Pain in left foot: Secondary | ICD-10-CM | POA: Diagnosis not present

## 2018-02-25 DIAGNOSIS — D72829 Elevated white blood cell count, unspecified: Secondary | ICD-10-CM | POA: Insufficient documentation

## 2018-02-25 DIAGNOSIS — I739 Peripheral vascular disease, unspecified: Secondary | ICD-10-CM

## 2018-02-25 DIAGNOSIS — E119 Type 2 diabetes mellitus without complications: Secondary | ICD-10-CM | POA: Insufficient documentation

## 2018-02-25 DIAGNOSIS — R0989 Other specified symptoms and signs involving the circulatory and respiratory systems: Secondary | ICD-10-CM | POA: Diagnosis present

## 2018-02-25 LAB — CBC WITH DIFFERENTIAL/PLATELET
ABS IMMATURE GRANULOCYTES: 0.13 10*3/uL — AB (ref 0.00–0.07)
Abs Immature Granulocytes: 0.13 10*3/uL — ABNORMAL HIGH (ref 0.00–0.07)
BASOS ABS: 0.1 10*3/uL (ref 0.0–0.1)
BASOS PCT: 0 %
Basophils Absolute: 0.1 10*3/uL (ref 0.0–0.1)
Basophils Relative: 0 %
EOS PCT: 1 %
Eosinophils Absolute: 0.1 10*3/uL (ref 0.0–0.5)
Eosinophils Absolute: 0 10*3/uL (ref 0.0–0.5)
Eosinophils Relative: 0 %
HCT: 33 % — ABNORMAL LOW (ref 39.0–52.0)
HEMATOCRIT: 38.2 % — AB (ref 39.0–52.0)
HEMOGLOBIN: 12.9 g/dL — AB (ref 13.0–17.0)
Hemoglobin: 11.4 g/dL — ABNORMAL LOW (ref 13.0–17.0)
Immature Granulocytes: 1 %
Immature Granulocytes: 1 %
LYMPHS ABS: 1.6 10*3/uL (ref 0.7–4.0)
LYMPHS PCT: 9 %
Lymphocytes Relative: 9 %
Lymphs Abs: 1.4 10*3/uL (ref 0.7–4.0)
MCH: 32.8 pg (ref 26.0–34.0)
MCH: 32.9 pg (ref 26.0–34.0)
MCHC: 33.8 g/dL (ref 30.0–36.0)
MCHC: 34.5 g/dL (ref 30.0–36.0)
MCV: 97.2 fL (ref 80.0–100.0)
MCV: 95.4 fL (ref 80.0–100.0)
MONO ABS: 1.4 10*3/uL — AB (ref 0.1–1.0)
MONOS PCT: 8 %
Monocytes Absolute: 1.3 10*3/uL — ABNORMAL HIGH (ref 0.1–1.0)
Monocytes Relative: 7 %
NEUTROS ABS: 14 10*3/uL — AB (ref 1.7–7.7)
NRBC: 0 % (ref 0.0–0.2)
Neutro Abs: 13.1 10*3/uL — ABNORMAL HIGH (ref 1.7–7.7)
Neutrophils Relative %: 82 %
Neutrophils Relative %: 82 %
PLATELETS: 283 10*3/uL (ref 150–400)
Platelets: 313 10*3/uL (ref 150–400)
RBC: 3.93 MIL/uL — AB (ref 4.22–5.81)
RBC: 3.46 MIL/uL — ABNORMAL LOW (ref 4.22–5.81)
RDW: 12.9 % (ref 11.5–15.5)
RDW: 12.8 % (ref 11.5–15.5)
WBC: 17.2 10*3/uL — AB (ref 4.0–10.5)
WBC: 16.1 10*3/uL — AB (ref 4.0–10.5)
nRBC: 0 % (ref 0.0–0.2)

## 2018-02-25 LAB — COMPREHENSIVE METABOLIC PANEL
ALBUMIN: 3.8 g/dL (ref 3.5–5.0)
ALK PHOS: 90 U/L (ref 38–126)
ALT: 36 U/L (ref 0–44)
AST: 26 U/L (ref 15–41)
Anion gap: 8 (ref 5–15)
BILIRUBIN TOTAL: 1.1 mg/dL (ref 0.3–1.2)
BUN: 18 mg/dL (ref 8–23)
CALCIUM: 9.4 mg/dL (ref 8.9–10.3)
CO2: 25 mmol/L (ref 22–32)
Chloride: 102 mmol/L (ref 98–111)
Creatinine, Ser: 1.02 mg/dL (ref 0.61–1.24)
GFR calc Af Amer: >60 mL/min (ref >60)
GFR calc non Af Amer: >60 mL/min (ref >60)
Glucose, Bld: 267 mg/dL — ABNORMAL HIGH (ref 70–99)
Potassium: 4.7 mmol/L (ref 3.5–5.1)
SODIUM: 135 mmol/L (ref 135–145)
TOTAL PROTEIN: 7.1 g/dL (ref 6.5–8.1)

## 2018-02-25 LAB — URINALYSIS, COMPLETE (UACMP) WITH MICROSCOPIC
Bacteria, UA: NONE SEEN
Bilirubin Urine: NEGATIVE
Hgb urine dipstick: NEGATIVE
Ketones, ur: NEGATIVE mg/dL
Nitrite: NEGATIVE
PROTEIN: NEGATIVE mg/dL
SPECIFIC GRAVITY, URINE: 1.011 (ref 1.005–1.030)
pH: 6 (ref 5.0–8.0)

## 2018-02-25 LAB — PROTIME-INR
INR: 1.02
Prothrombin Time: 13.3 seconds (ref 11.4–15.2)

## 2018-02-25 LAB — LACTIC ACID, PLASMA: LACTIC ACID, VENOUS: 1.8 mmol/L (ref 0.5–1.9)

## 2018-02-25 LAB — APTT: APTT: 27 s (ref 24–36)

## 2018-02-25 LAB — TROPONIN I: Troponin I: 0.03 ng/mL (ref <0.03)

## 2018-02-25 MED ORDER — SODIUM CHLORIDE 0.9 % IV SOLN
1.0000 g | Freq: Once | INTRAVENOUS | Status: AC
  Administered 2018-02-25: 1 g via INTRAVENOUS
  Filled 2018-02-25: qty 10

## 2018-02-25 MED ORDER — CEPHALEXIN 500 MG PO CAPS: 500.0000 mg | ORAL_CAPSULE | Freq: Four times a day (QID) | ORAL | 0 refills | Status: DC

## 2018-02-25 MED ORDER — SODIUM CHLORIDE 0.9 % IV BOLUS
250.0000 mL | Freq: Once | INTRAVENOUS | Status: AC
  Administered 2018-02-25: 250 mL via INTRAVENOUS

## 2018-02-25 NOTE — ED Provider Notes (Signed)
[facility] Emergency Department Provider Note       Time seen: ----------------------------------------- 6:53 PM on 02/25/2018 -----------------------------------------   I have reviewed the triage vital signs and the nursing notes.  HISTORY   Chief Complaint Circulatory Problem    HPI Kurt [name] is a 77 y.o. male with a history of AAA, diabetes, peripheral vascular disease who presents to the ED for possible ischemic foot.  Patient arrived from a facility after staff was not able to find a pulse on the left foot.  He had a femoral bypass done on Thursday involving the right leg mostly.  Patient is usually able to stand but has not been able to stand today.  Patient states the foot is not bothering him significantly.  Past Medical History:  Diagnosis Date  . AAA (abdominal aortic aneurysm) (HCC)   . Diabetes mellitus without complication [facility])     Patient Active Problem List   Diagnosis Date Noted  . Pressure injury of skin 02/18/2018  . 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 Carotid stenosis 11/14/2017  . Diabetes mellitus type 2, uncomplicated (HCC) 03/02/2017  . Imbalance 07/06/2016  . 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 Tobacco dependence 05/06/2016  . Diabetes (HCC) 05/06/2016  . Abdominal aortic aneurysm without rupture (HCC) 05/06/2016  . Atherosclerosis of native arteries of extremity with intermittent claudication (HCC) 05/06/2016  . Atherosclerotic PVD with intermittent claudication (HCC) 05/20/2014    Past Surgical History:  Procedure Laterality Date  . ABDOMINAL AORTIC ANEURYSM REPAIR    . arm surgery Left   . EMBOLECTOMY Right 02/17/2018   Procedure: FOGERTY EMBOLECTOMY;  Surgeon: Annice Needy, MD;  Location: [facility] ORS;  Service: Vascular;  Laterality: Right;  . FEMORAL-FEMORAL BYPASS GRAFT Right 02/17/2018   Procedure: BYPASS FEMORAL-FEMORAL ARTERY;  Surgeon: Annice Needy, MD;  Location: [facility] ORS;   Service: Vascular;  Laterality: Right;  . THROMBECTOMY FEMORAL ARTERY Right 02/17/2018   Procedure: THROMBECTOMY FEMORAL ARTERY;  Surgeon: Annice Needy, MD;  Location: [facility] ORS;  Service: Vascular;  Laterality: Right;    Allergies Patient has no known allergies.  Social History Social History   Tobacco Use  . Smoking status: Heavy Tobacco Smoker  . Smokeless tobacco: Current User  Substance Use Topics  . Alcohol use: Not Currently    Frequency: Never  . Drug use: Never   Review of Systems Constitutional: Negative for fever. Cardiovascular: Negative for chest pain.  Positive for poor peripheral pulses in the left foot Respiratory: Negative for shortness of breath. Gastrointestinal: Negative for abdominal pain, vomiting and diarrhea. Musculoskeletal: Positive for mild left foot pain Skin: Positive for color changes to the left foot Neurological: Negative for headaches, focal weakness or numbness.  All systems negative/normal/unremarkable except as stated in the HPI  ____________________________________________   PHYSICAL EXAM:  VITAL SIGNS: ED Triage Vitals  Enc Vitals Group     BP 02/25/18 1826 97/70     Pulse Rate 02/25/18 1826 99     Resp 02/25/18 1826 18     Temp 02/25/18 1826 98.8 F (37.1 C)     Temp Source 02/25/18 1826 Oral     SpO2 02/25/18 1826 98 %     Weight 02/25/18 1830 183 lb 14.4 oz (83.4 kg)     Height 02/25/18 1830 6\' 3"  (1.905 m)     Head Circumference --      Peak Flow --      Pain Score 02/25/18 1830 0  Pain Loc --      Pain Edu? --      Excl. in GC? --    Constitutional: Alert and oriented. Well appearing and in no distress. Cardiovascular: Irregularly irregular rhythm. No murmurs, rubs, or gallops.  Poor peripheral pulses, adequate dopplerable right posterior tibial, right dorsalis pedis.  Good left posterior tibial and fair left dorsalis pedis pulse by Doppler Respiratory: Normal respiratory effort without tachypnea nor retractions.  Breath sounds are clear and equal bilaterally. No wheezes/rales/rhonchi. Gastrointestinal: Soft and nontender. Normal bowel sounds Genitourinary: Right inguinal incision site does not appear red or swollen. Musculoskeletal: Limited range of motion, mild tenderness is noted on the left foot Neurologic:  Normal speech and language. No gross focal neurologic deficits are appreciated.  Skin:  Skin is warm, dry and intact.  No obvious color changes are appreciated between the feet Psychiatric: Mood and affect are normal. Speech and behavior are normal.  ____________________________________________  EKG: Interpreted by me.  Atrial fibrillation with a rate of 97 bpm, right bundle branch block, left axis deviation  ____________________________________________  ED COURSE:  As part of my medical decision making, I reviewed the following data within the electronic MEDICAL RECORD NUMBER History obtained from family if available, nursing notes, old chart and ekg, as well as notes from prior ED visits. Patient presented for possible ischemia, we will assess with labs and imaging as indicated at this time. Clinical Course as of Feb 25 2253  Wynelle Link Feb 25, 2018  2002 Troponin is chronically elevated  Troponin I(!!): 0.03 [JW]    Clinical Course User Index [JW] Emily Filbert, MD   Procedures ____________________________________________   LABS (pertinent positives/negatives)  Labs Reviewed  CBC WITH DIFFERENTIAL/PLATELET - Abnormal; Notable for the following components:      Result Value   WBC 17.2 (*)    RBC 3.93 (*)    Hemoglobin 12.9 (*)    HCT 38.2 (*)    Neutro Abs 14.0 (*)    Monocytes Absolute 1.3 (*)    Abs Immature Granulocytes 0.13 (*)    All other components within normal limits  COMPREHENSIVE METABOLIC PANEL - Abnormal; Notable for the following components:   Glucose, Bld 267 (*)    All other components within normal limits  TROPONIN I - Abnormal; Notable for the following  components:   Troponin I 0.03 (*)    All other components within normal limits  URINALYSIS, COMPLETE (UACMP) WITH MICROSCOPIC - Abnormal; Notable for the following components:   Color, Urine YELLOW (*)    APPearance CLEAR (*)    Glucose, UA >=500 (*)    Leukocytes, UA TRACE (*)    All other components within normal limits  CBC WITH DIFFERENTIAL/PLATELET - Abnormal; Notable for the following components:   WBC 16.1 (*)    RBC 3.46 (*)    Hemoglobin 11.4 (*)    HCT 33.0 (*)    Neutro Abs 13.1 (*)    Monocytes Absolute 1.4 (*)    Abs Immature Granulocytes 0.13 (*)    All other components within normal limits  CULTURE, BLOOD (ROUTINE X 2)  CULTURE, BLOOD (ROUTINE X 2)  PROTIME-INR  APTT  LACTIC ACID, PLASMA    RADIOLOGY Images were viewed by me  Chest x-ray IMPRESSION: Decreased lung volumes with minimal basilar atelectasis. ____________________________________________  DIFFERENTIAL DIAGNOSIS   Peripheral vascular disease, thrombosis, dehydration, electrolyte abnormality, occult infection  FINAL ASSESSMENT AND PLAN  Leukocytosis, peripheral vascular disease   Plan: The patient had presented for  concerns about pulses in his feet.  He did have a weak dopplerable dorsalis pedis pulse, ankle-brachial indices were unremarkable bilaterally.  Patient's labs did reveal leukocytosis of uncertain etiology. Patient's imaging not reveal any acute process.  We did recheck his white count and it appeared be going down.  I did give him a dose of IV Rocephin and will discharge him with Keflex in case he is developing a wound infection.  He is cleared for outpatient follow-up at this time.   Ulice Dash, MD   Note: This note was generated in part or whole with voice recognition software. Voice recognition is usually quite accurate but there are transcription errors that can and very often do occur. I apologize for any typographical errors that were not detected and corrected.      Emily Filbert, MD 02/25/18 2255

## 2018-02-25 NOTE — ED Triage Notes (Signed)
Pt arrived from facility after staff was not able to find a pedal pulse on pt. Pt had a femoral bypass done on Thurs. Pt is usually able to stand but has not been able to today. Feet cool currently but staff stated feet were purple and cold at facility.

## 2018-02-25 NOTE — ED Notes (Addendum)
After verification from the pt to speak with his sister, Erskine Squibb, this RN informed her of why the pt was brought here and what has been done so far. Family agreeable and asked that this RN call her later to let her know what the pt's disposition will be.

## 2018-02-27 ENCOUNTER — Other Ambulatory Visit: Payer: Self-pay | Admitting: Adult Health

## 2018-02-27 MED ORDER — TRAMADOL HCL 50 MG PO TABS: 50.0000 mg | ORAL_TABLET | Freq: Four times a day (QID) | ORAL | 0 refills | Status: DC | PRN

## 2018-02-28 ENCOUNTER — Emergency Department: Payer: Medicare Other

## 2018-02-28 ENCOUNTER — Encounter: Payer: Self-pay | Admitting: Emergency Medicine

## 2018-02-28 ENCOUNTER — Ambulatory Visit
Admission: RE | Admit: 2018-02-28 | Discharge: 2018-02-28 | Disposition: A | Discharge status: Home / Self Care | Payer: Medicare Other | Source: Ambulatory Visit | Attending: Neurology | Admitting: Neurology

## 2018-02-28 ENCOUNTER — Other Ambulatory Visit: Payer: Self-pay

## 2018-02-28 ENCOUNTER — Non-Acute Institutional Stay (SKILLED_NURSING_FACILITY): Payer: Medicare Other | Admitting: Adult Health

## 2018-02-28 ENCOUNTER — Observation Stay
Admission: EM | Admit: 2018-02-28 | Discharge: 2018-03-01 | Disposition: A | Discharge status: Skilled Nursing Facility | Payer: Medicare Other | Attending: Internal Medicine | Admitting: Internal Medicine

## 2018-02-28 ENCOUNTER — Encounter: Payer: Self-pay | Admitting: Adult Health

## 2018-02-28 DIAGNOSIS — R531 Weakness: Secondary | ICD-10-CM | POA: Diagnosis not present

## 2018-02-28 DIAGNOSIS — E1151 Type 2 diabetes mellitus with diabetic peripheral angiopathy without gangrene: Secondary | ICD-10-CM | POA: Insufficient documentation

## 2018-02-28 DIAGNOSIS — I10 Essential (primary) hypertension: Secondary | ICD-10-CM | POA: Diagnosis not present

## 2018-02-28 DIAGNOSIS — Z79899 Other long term (current) drug therapy: Secondary | ICD-10-CM | POA: Insufficient documentation

## 2018-02-28 DIAGNOSIS — R29810 Facial weakness: Secondary | ICD-10-CM | POA: Diagnosis not present

## 2018-02-28 DIAGNOSIS — R4182 Altered mental status, unspecified: Secondary | ICD-10-CM

## 2018-02-28 DIAGNOSIS — Z7984 Long term (current) use of oral hypoglycemic drugs: Secondary | ICD-10-CM | POA: Insufficient documentation

## 2018-02-28 DIAGNOSIS — I70209 Unspecified atherosclerosis of native arteries of extremities, unspecified extremity: Secondary | ICD-10-CM | POA: Diagnosis not present

## 2018-02-28 DIAGNOSIS — Z7902 Long term (current) use of antithrombotics/antiplatelets: Secondary | ICD-10-CM | POA: Diagnosis not present

## 2018-02-28 DIAGNOSIS — R29898 Other symptoms and signs involving the musculoskeletal system: Secondary | ICD-10-CM

## 2018-02-28 DIAGNOSIS — I472 Ventricular tachycardia: Secondary | ICD-10-CM | POA: Insufficient documentation

## 2018-02-28 DIAGNOSIS — D72829 Elevated white blood cell count, unspecified: Secondary | ICD-10-CM | POA: Diagnosis not present

## 2018-02-28 DIAGNOSIS — Z7901 Long term (current) use of anticoagulants: Secondary | ICD-10-CM | POA: Diagnosis not present

## 2018-02-28 DIAGNOSIS — Z95820 Peripheral vascular angioplasty status with implants and grafts: Secondary | ICD-10-CM | POA: Diagnosis not present

## 2018-02-28 LAB — COMPREHENSIVE METABOLIC PANEL
ALBUMIN: 3.6 g/dL (ref 3.5–5.0)
ALT: 71 U/L — ABNORMAL HIGH (ref 0–44)
ANION GAP: 11 (ref 5–15)
AST: 69 U/L — AB (ref 15–41)
Alkaline Phosphatase: 124 U/L (ref 38–126)
BUN: 18 mg/dL (ref 8–23)
CHLORIDE: 97 mmol/L — AB (ref 98–111)
CO2: 25 mmol/L (ref 22–32)
Calcium: 9.4 mg/dL (ref 8.9–10.3)
Creatinine, Ser: 1.1 mg/dL (ref 0.61–1.24)
GFR calc Af Amer: >60 mL/min (ref >60)
GFR calc non Af Amer: >60 mL/min (ref >60)
GLUCOSE: 184 mg/dL — AB (ref 70–99)
POTASSIUM: 3.7 mmol/L (ref 3.5–5.1)
Sodium: 133 mmol/L — ABNORMAL LOW (ref 135–145)
TOTAL PROTEIN: 7.6 g/dL (ref 6.5–8.1)
Total Bilirubin: 1 mg/dL (ref 0.3–1.2)

## 2018-02-28 LAB — CBC
HCT: 35.9 % — ABNORMAL LOW (ref 39.0–52.0)
Hemoglobin: 12.5 g/dL — ABNORMAL LOW (ref 13.0–17.0)
MCH: 33.5 pg (ref 26.0–34.0)
MCHC: 34.8 g/dL (ref 30.0–36.0)
MCV: 96.2 fL (ref 80.0–100.0)
NRBC: 0 % (ref 0.0–0.2)
PLATELETS: 351 10*3/uL (ref 150–400)
RBC: 3.73 MIL/uL — AB (ref 4.22–5.81)
RDW: 12.7 % (ref 11.5–15.5)
WBC: 11.1 10*3/uL — AB (ref 4.0–10.5)

## 2018-02-28 LAB — URINALYSIS, COMPLETE (UACMP) WITH MICROSCOPIC
Bacteria, UA: NONE SEEN
Bilirubin Urine: NEGATIVE
GLUCOSE, UA: NEGATIVE mg/dL
HGB URINE DIPSTICK: NEGATIVE
KETONES UR: NEGATIVE mg/dL
LEUKOCYTES UA: NEGATIVE
Nitrite: NEGATIVE
PROTEIN: NEGATIVE mg/dL
Specific Gravity, Urine: 1.015 (ref 1.005–1.030)
Squamous Epithelial / LPF: NONE SEEN (ref 0–5)
pH: 6 (ref 5.0–8.0)

## 2018-02-28 LAB — URINE DRUG SCREEN, QUALITATIVE (ARMC ONLY)
AMPHETAMINES, UR SCREEN: NOT DETECTED
BENZODIAZEPINE, UR SCRN: NOT DETECTED
Barbiturates, Ur Screen: NOT DETECTED
Cannabinoid 50 Ng, Ur ~~LOC~~: NOT DETECTED
Cocaine Metabolite,Ur ~~LOC~~: NOT DETECTED
MDMA (ECSTASY) UR SCREEN: NOT DETECTED
METHADONE SCREEN, URINE: NOT DETECTED
OPIATE, UR SCREEN: NOT DETECTED
PHENCYCLIDINE (PCP) UR S: NOT DETECTED
Tricyclic, Ur Screen: NOT DETECTED

## 2018-02-28 LAB — DIFFERENTIAL
ABS IMMATURE GRANULOCYTES: 0.07 10*3/uL (ref 0.00–0.07)
BASOS ABS: 0.1 10*3/uL (ref 0.0–0.1)
BASOS PCT: 1 %
EOS ABS: 0 10*3/uL (ref 0.0–0.5)
EOS PCT: 0 %
Immature Granulocytes: 1 %
Lymphocytes Relative: 9 %
Lymphs Abs: 1 10*3/uL (ref 0.7–4.0)
Monocytes Absolute: 0.9 10*3/uL (ref 0.1–1.0)
Monocytes Relative: 8 %
NEUTROS PCT: 81 %
Neutro Abs: 9.1 10*3/uL — ABNORMAL HIGH (ref 1.7–7.7)

## 2018-02-28 LAB — ETHANOL: Alcohol, Ethyl (B): <10 mg/dL (ref <10)

## 2018-02-28 LAB — APTT: aPTT: 34 seconds (ref 24–36)

## 2018-02-28 LAB — PROTIME-INR
INR: 1.45
PROTHROMBIN TIME: 17.5 s — AB (ref 11.4–15.2)

## 2018-02-28 LAB — TROPONIN I: TROPONIN I: 0.05 ng/mL — AB (ref <0.03)

## 2018-02-28 LAB — GLUCOSE, CAPILLARY: GLUCOSE-CAPILLARY: 199 mg/dL — AB (ref 70–99)

## 2018-02-28 MED ORDER — METOPROLOL SUCCINATE ER 25 MG PO TB24
25.0000 mg | ORAL_TABLET | Freq: Every day | ORAL | Status: DC
  Administered 2018-03-01: 25 mg via ORAL
  Filled 2018-02-28: qty 1

## 2018-02-28 MED ORDER — ACETAMINOPHEN 325 MG PO TABS: 650.0000 mg | ORAL_TABLET | ORAL | Status: DC | PRN

## 2018-02-28 MED ORDER — CEPHALEXIN 500 MG PO CAPS
500.0000 mg | ORAL_CAPSULE | Freq: Two times a day (BID) | ORAL | Status: DC
  Administered 2018-02-28 – 2018-03-01 (×2): 500 mg via ORAL
  Filled 2018-02-28 (×2): qty 1

## 2018-02-28 MED ORDER — VITAMIN D 25 MCG (1000 UNIT) PO TABS
5000.0000 [IU] | ORAL_TABLET | Freq: Every day | ORAL | Status: DC
  Administered 2018-03-01: 10:00:00 5000 [IU] via ORAL

## 2018-02-28 MED ORDER — APIXABAN 5 MG PO TABS
5.0000 mg | ORAL_TABLET | Freq: Two times a day (BID) | ORAL | Status: DC
  Administered 2018-02-28 – 2018-03-01 (×2): 5 mg via ORAL
  Filled 2018-02-28 (×2): qty 1

## 2018-02-28 MED ORDER — STROKE: EARLY STAGES OF RECOVERY BOOK
Freq: Once | Status: AC
  Administered 2018-02-28: 23:00:00

## 2018-02-28 MED ORDER — INSULIN ASPART 100 UNIT/ML ~~LOC~~ SOLN
0.0000 [IU] | Freq: Three times a day (TID) | SUBCUTANEOUS | Status: DC
  Administered 2018-03-01: 1 [IU] via SUBCUTANEOUS
  Filled 2018-02-28: qty 1

## 2018-02-28 MED ORDER — CLOPIDOGREL BISULFATE 75 MG PO TABS
75.0000 mg | ORAL_TABLET | Freq: Every day | ORAL | Status: DC
  Administered 2018-02-28 – 2018-03-01 (×2): 75 mg via ORAL
  Filled 2018-02-28 (×2): qty 1

## 2018-02-28 MED ORDER — ACETAMINOPHEN 650 MG RE SUPP: 650.0000 mg | RECTAL | Status: DC | PRN

## 2018-02-28 MED ORDER — COENZYME Q10 30 MG PO CAPS: 30.0000 mg | ORAL_CAPSULE | Freq: Three times a day (TID) | ORAL | Status: DC

## 2018-02-28 MED ORDER — ATORVASTATIN CALCIUM 20 MG PO TABS: 40.0000 mg | ORAL_TABLET | Freq: Every day | ORAL | Status: DC

## 2018-02-28 MED ORDER — ACETAMINOPHEN 160 MG/5ML PO SOLN
650.0000 mg | ORAL | Status: DC | PRN
  Filled 2018-02-28: qty 20.3

## 2018-02-28 MED ORDER — GARLIC OIL 1000 MG PO CAPS: 1000.0000 mg | ORAL_CAPSULE | Freq: Every day | ORAL | Status: DC | PRN

## 2018-02-28 MED ORDER — COCONUT OIL 1000 MG PO CAPS: 1000.0000 mg | ORAL_CAPSULE | ORAL | Status: DC | PRN

## 2018-02-28 MED ORDER — GABAPENTIN 600 MG PO TABS: 300.0000 mg | ORAL_TABLET | Freq: Every day | ORAL | Status: DC | PRN

## 2018-02-28 MED ORDER — DOCUSATE SODIUM 100 MG PO CAPS
100.0000 mg | ORAL_CAPSULE | Freq: Two times a day (BID) | ORAL | Status: DC
  Administered 2018-02-28 – 2018-03-01 (×2): 100 mg via ORAL
  Filled 2018-02-28 (×2): qty 1

## 2018-02-28 MED ORDER — GLIMEPIRIDE 2 MG PO TABS
2.0000 mg | ORAL_TABLET | Freq: Every day | ORAL | Status: DC
  Administered 2018-03-01: 08:00:00 2 mg via ORAL
  Filled 2018-02-28: qty 1

## 2018-02-28 MED ORDER — VITAMIN B-12 1000 MCG PO TABS
1000.0000 ug | ORAL_TABLET | Freq: Every day | ORAL | Status: DC
  Administered 2018-03-01: 10:00:00 1000 ug via ORAL
  Filled 2018-02-28: qty 1

## 2018-02-28 MED ORDER — PROSTATE THERAPY COMPLEX PO CAPS: ORAL_CAPSULE | Freq: Every day | ORAL | Status: DC

## 2018-02-28 MED ORDER — RED YEAST RICE 600 MG PO CAPS: 1.0000 | ORAL_CAPSULE | Freq: Every day | ORAL | Status: DC

## 2018-02-28 NOTE — Progress Notes (Signed)
   02/28/18 1645  Clinical Encounter Type  Visited With Patient  Visit Type Code   Chaplain responded to code stroke.  Patient was engaged with video physician.  Chaplain introduced self and asked patient if she could follow up later to which patient agreed.

## 2018-02-28 NOTE — ED Triage Notes (Signed)
Pt to ED via [facility] from [facility] place for rehab for femoral bypass. Nurse states that at 1400 they noticed him leaning to the left and weakness. They were going to do a CT at the facility but family wanted pt evaluated at the ER. Pt does have left facial droop, hand grasp and pedal push and pull weak but equal.

## 2018-02-28 NOTE — ED Provider Notes (Signed)
[facility] Emergency Department Provider Note  ____________________________________________  Time seen: Approximately 6:53 PM  I have reviewed the triage vital signs and the nursing notes.   HISTORY  Chief Complaint Facial Droop  Level 5 Caveat: Portions of the History and Physical including HPI and review of systems are unable to be completely obtained due to patient being a poor historian    HPI Kurt [name] is a 77 y.o. male with a history of diabetes, AAA, stroke, and recent femorofemoral bypass graft by Dr. Wyn Quaker on Eliquis who was sent to the ED today due to confusion and left facial droop.  Last known well was 2:00 PM, first noticed at 3:30 PM.  Patient denies any acute complaints but also has difficulty providing a clear history for participating in exam consistently.      Past Medical History:  Diagnosis Date  . AAA (abdominal aortic aneurysm) (HCC)   . Diabetes mellitus without complication [facility])      Patient Active Problem List   Diagnosis Date Noted  . Leukocytosis 02/28/2018  . Altered mental status 02/28/2018  . Pressure injury of skin 02/18/2018  . 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 Carotid stenosis 11/14/2017  . Diabetes mellitus type 2, uncomplicated (HCC) 03/02/2017  . Imbalance 07/06/2016  . 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 Tobacco dependence 05/06/2016  . Diabetes (HCC) 05/06/2016  . Abdominal aortic aneurysm without rupture (HCC) 05/06/2016  . Atherosclerosis of native arteries of extremity with intermittent claudication (HCC) 05/06/2016  . Atherosclerotic PVD with intermittent claudication (HCC) 05/20/2014     Past Surgical History:  Procedure Laterality Date  . ABDOMINAL AORTIC ANEURYSM REPAIR    . arm surgery Left   . EMBOLECTOMY Right 02/17/2018   Procedure: FOGERTY EMBOLECTOMY;  Surgeon: Annice Needy, MD;  Location: [facility] ORS;  Service: Vascular;  Laterality: Right;  .  FEMORAL-FEMORAL BYPASS GRAFT Right 02/17/2018   Procedure: BYPASS FEMORAL-FEMORAL ARTERY;  Surgeon: Annice Needy, MD;  Location: [facility] ORS;  Service: Vascular;  Laterality: Right;  . THROMBECTOMY FEMORAL ARTERY Right 02/17/2018   Procedure: THROMBECTOMY FEMORAL ARTERY;  Surgeon: Annice Needy, MD;  Location: [facility] ORS;  Service: Vascular;  Laterality: Right;     Prior to Admission medications   Medication Sig Start Date End Date Taking? Authorizing Provider  apixaban (ELIQUIS) 5 MG TABS tablet Take 1 tablet (5 mg total) by mouth 2 (two) times daily. 02/23/18  Yes Altamese Dilling, MD  atorvastatin (LIPITOR) 40 MG tablet Take 1 tablet (40 mg total) by mouth daily at 6 PM. 02/23/18  Yes Altamese Dilling, MD  cephALEXin (KEFLEX) 500 MG capsule Take 1 capsule (500 mg total) by mouth 4 (four) times daily for 10 days. 02/25/18 03/07/18 Yes Emily Filbert, MD  Cholecalciferol (D-5000) 5000 units TABS Take 5,000 Units by mouth daily.    Yes [provider]  clopidogrel (PLAVIX) 75 MG tablet Take 75 mg by mouth daily.    Yes [provider]  co-enzyme Q-10 30 MG capsule Take 30 mg by mouth 3 (three) times daily.   Yes [provider]  Coconut Oil 1000 MG CAPS Take 1,000 mg by mouth as needed (dry skin).    Yes [provider]  docusate sodium (COLACE) 100 MG capsule Take 100 mg by mouth 2 (two) times daily.   Yes [provider]  donepezil (ARICEPT) 5 MG tablet Take 5 mg by mouth at bedtime.  02/15/17  Yes [provider]  gabapentin (NEURONTIN) 300 MG capsule Take 300 mg by mouth daily as needed (Pain).  03/15/16  Yes [provider]  Garlic Oil 1000 MG CAPS Take 1,000 mg by mouth daily as needed (cholesterol).    Yes [provider]  glimepiride (AMARYL) 2 MG tablet Take 2 mg by mouth daily with breakfast.  03/14/16  Yes [provider]  metFORMIN (GLUCOPHAGE-XR) 500 MG 24 hr tablet Take 500 mg by mouth daily  with breakfast.   Yes [provider]  metoprolol succinate (TOPROL-XL) 25 MG 24 hr tablet Take 25 mg by mouth daily.   Yes [provider]  Misc Natural Products (PROSTATE THERAPY COMPLEX PO) Take 562 mg by mouth daily.    Yes [provider]  nicotine (NICODERM CQ - DOSED IN MG/24 HOURS) 21 mg/24hr patch Place 1 patch (21 mg total) onto the skin daily. 02/24/18  Yes Altamese Dilling, MD  Red Yeast Rice 600 MG CAPS Take 1 capsule by mouth daily.    Yes [provider]  traMADol (ULTRAM) 50 MG tablet Take 1 tablet (50 mg total) by mouth every 6 (six) hours as needed for moderate pain. 02/27/18  Yes Sharee Holster, NP  vitamin B-12 (CYANOCOBALAMIN) 1000 MCG tablet Take 1,000 mcg by mouth daily.    Yes [provider]     Allergies Patient has no known allergies.   Family History  Problem Relation Age of Onset  . Alzheimer's disease Mother   . Heart disease Brother     Social History Social History   Tobacco Use  . Smoking status: Heavy Tobacco Smoker  . Smokeless tobacco: Current User  Substance Use Topics  . Alcohol use: Not Currently    Frequency: Never  . Drug use: Never    Review of Systems  Constitutional:   No fever or chills.  ENT:   No sore throat. No rhinorrhea. Cardiovascular:   No chest pain or syncope. Respiratory:   No dyspnea or cough. Gastrointestinal:   Negative for abdominal pain, vomiting and diarrhea.  Musculoskeletal:   Negative for focal pain or swelling All other systems reviewed and are negative except as documented above in ROS and HPI.  ____________________________________________   PHYSICAL EXAM:  VITAL SIGNS: ED Triage Vitals  Enc Vitals Group     BP 02/28/18 1617 130/73     Pulse Rate 02/28/18 1617 (!) 101     Resp 02/28/18 1617 20     Temp 02/28/18 1617 98.5 F (36.9 C)     Temp Source 02/28/18 1617 Oral     SpO2 02/28/18 1617 98 %     Weight 02/28/18 1620 148 lb 12.8 oz (67.5 kg)      Height 02/28/18 1620 6\' 3"  (1.905 m)     Head Circumference --      Peak Flow --      Pain Score 02/28/18 1620 0     Pain Loc --      Pain Edu? --      Excl. in GC? --     Vital signs reviewed, nursing assessments reviewed.   Constitutional:   Alert and oriented.  Ill-appearing. Eyes:   Conjunctivae are normal. EOMI. PERRL. ENT      Head:   Normocephalic and atraumatic.      Nose:   No congestion/rhinnorhea.       Mouth/Throat:   Dry mucous membranes, no pharyngeal erythema. No peritonsillar mass.       Neck:   No meningismus.  Full ROM. Hematological/Lymphatic/Immunilogical:   No cervical lymphadenopathy. Cardiovascular:   RRR. Symmetric bilateral radial  pulses.  Thready DP pulses bilaterally, warm extremities.  No murmurs. Cap refill less than 2 seconds. Respiratory:   Normal respiratory effort without tachypnea/retractions. Breath sounds are clear and equal bilaterally. No wheezes/rales/rhonchi. Gastrointestinal:   Soft and nontender. Non distended. There is no CVA tenderness.  No rebound, rigidity, or guarding. Musculoskeletal:   Normal range of motion in all extremities. No joint effusions.  No lower extremity tenderness.  No edema. Neurologic:   Slurred speech Left facial droop at the corner of the mouth Left upper extremity weakness Bilateral lower extremity weakness with minimal motor effort NIH stroke scale 6 .  Skin:    Skin is warm, dry and intact. No rash noted.  No petechiae, purpura, or bullae.  ____________________________________________    LABS (pertinent positives/negatives) (all labs ordered are listed, but only abnormal results are displayed) Labs Reviewed  PROTIME-INR - Abnormal; Notable for the following components:      Result Value   Prothrombin Time 17.5 (*)    All other components within normal limits  CBC - Abnormal; Notable for the following components:   WBC 11.1 (*)    RBC 3.73 (*)    Hemoglobin 12.5 (*)    HCT 35.9 (*)    All other  components within normal limits  DIFFERENTIAL - Abnormal; Notable for the following components:   Neutro Abs 9.1 (*)    All other components within normal limits  COMPREHENSIVE METABOLIC PANEL - Abnormal; Notable for the following components:   Sodium 133 (*)    Chloride 97 (*)    Glucose, Bld 184 (*)    AST 69 (*)    ALT 71 (*)    All other components within normal limits  TROPONIN I - Abnormal; Notable for the following components:   Troponin I 0.05 (*)    All other components within normal limits  GLUCOSE, CAPILLARY - Abnormal; Notable for the following components:   Glucose-Capillary 199 (*)    All other components within normal limits  ETHANOL  APTT  URINE DRUG SCREEN, QUALITATIVE ([facility] ONLY)  URINALYSIS, COMPLETE ([facility]) WITH MICROSCOPIC   ____________________________________________   EKG  Interpreted by me Sinus rhythm rate of 98, left axis, right bundle branch block.  No acute ischemic changes.  ____________________________________________    RADIOLOGY  Ct Head Code Stroke Wo Contrast  Result Date: 02/28/2018 CLINICAL DATA:  Code stroke. Last seen normal 1400 hours. Leaning to LEFT. Weakness. LEFT facial droop. EXAM: CT HEAD WITHOUT CONTRAST TECHNIQUE: Contiguous axial images were obtained from the base of the skull through the vertex without intravenous contrast. COMPARISON:  MR head 02/21/2018. FINDINGS: Brain: No evidence for acute infarction, hemorrhage, mass lesion, or extra-axial fluid. Generalized atrophy. Hydrocephalus ex vacuo. Extensive hypoattenuation of white matter correlates with the MR findings of chronic microvascular ischemic change. There is an old RIGHT PCA territory infarct affecting much of the temporal and occipital lobes. Vascular: Calcification of the cavernous internal carotid arteries consistent with cerebrovascular atherosclerotic disease. No signs of intracranial large vessel occlusion. Skull: Calvarium intact. Sinuses/Orbits: No acute  finding. Other: None. ASPECTS [facility] Stroke Program Early CT Score) - Ganglionic level infarction (caudate, lentiform nuclei, internal capsule, insula, M1-M3 cortex): 7 - Supraganglionic infarction (M4-M6 cortex): 3 Total score (0-10 with 10 being normal): 10 IMPRESSION: 1. Advanced atrophy. Extensive small vessel disease. No acute intracranial findings. 2. ASPECTS is 10. 3. These results were called by telephone at  the time of interpretation on 02/28/2018 at 4:44 pm to Dr. Sharman Cheek , who verbally acknowledged these results. Electronically Signed   By: Elsie Stain M.D.   On: 02/28/2018 16:46    ____________________________________________   PROCEDURES Procedures  ____________________________________________  DIFFERENTIAL DIAGNOSIS   Intracranial hemorrhage, ischemic stroke, metabolic encephalopathy, dehydration  CLINICAL IMPRESSION / ASSESSMENT AND PLAN / ED COURSE  Pertinent labs & imaging results that were available during my care of the patient were reviewed by me and considered in my medical decision making (see chart for details).      Clinical Course as of Feb 29 1852  Wed Feb 28, 2018  1624 Patient presents with left facial droop at the corner of the mouth and relative weakness of left upper extremity, last known well 2:00 PM.  LVO screen negative.  We will proceed with code stroke work-up.   [PS]  1746 CT head discussed with radiology, no acute infarct or hemorrhage.  No acute findings.  Patient's evaluation was discussed with the neurologist, he feels there is no acute stroke, no indication for advanced imaging, would continue usual medical care.   [PS]    Clinical Course User Index [PS] Sharman Cheek, MD    ----------------------------------------- 6:57 PM on 02/28/2018 -----------------------------------------  Full neurology consult note reviewed which does recommend observation under stroke precautions and follow-up neurology consultation tomorrow.  I  will discuss with the hospitalist for further management.  Initial work-up unremarkable.  Vital signs stable.  Troponin slightly elevated at 0.05 which appears to be chronic baseline.  Patient is not having any cardiac symptoms.   ____________________________________________   FINAL CLINICAL IMPRESSION(S) / ED DIAGNOSES    Final diagnoses:  Left arm weakness     ED Discharge Orders    None      Portions of this note were generated with dragon dictation software. Dictation errors may occur despite best attempts at proofreading. Marland KitchenDorene Grebe, MD 02/28/18 [phone]

## 2018-02-28 NOTE — ED Notes (Signed)
Pt taken to CT.

## 2018-02-28 NOTE — Consult Note (Signed)
[facility] Tele[facility]urology Consult Services   Date of Service:   02/28/2018 16:38:50  Impression:     .  Toxic/metabolic encephalopathy No evidence at this time of acute ischemic stroke but he had a recent one as described.  Comments: Possible infection, systemic but not central nervous system  Metrics: Last Known Well: Unknown [facility] Notification Time: 02/28/2018 16:37:52 Arrival Time: 02/28/2018 16:13:00 Stamp Time: 02/28/2018 16:38:50 Time First Login Attempt: 02/28/2018 16:44:00 Video Start Time: 02/28/2018 16:44:00  Symptoms: Confusion NIHSS Start Assessment Time: 02/28/2018 16:51:00 Patient is not a candidate for tPA. Patient was not deemed candidate for tPA thrombolytics because of Current use of NOA's. Video End Time: 02/28/2018 16:58:00  CT head showed no acute hemorrhage or acute core infarct. CT head was reviewed.  Advanced imaging was not obtained as the presentation was not suggestive of Large Vessel Occlusive Disease.   Radiologist was not called back for review of advanced imaging because Not indicated ER Physician notified of the decision on thrombolytics management on 02/28/2018 16:57:00  Our recommendations are outlined below.  Recommendations:     .  Activate Stroke Protocol Admission/Order Set     .  Stroke/Telemetry Floor     .  Neuro Checks     .  Bedside Swallow Eval     .  DVT Prophylaxis     .  IV Fluids, Normal Saline     .  Head of Bed Below 30 Degrees     .  Euglycemia and Avoid Hyperthermia (PRN Acetaminophen)     .  Continue antithrombotic  Routine Consultation with Inhouse Neurology for Follow up Care  Sign Out:     .  Discussed with Emergency Department Provider    ------------------------------------------------------------------------------  History of Present Illness: Patient is a 77 year old Male.  Patient was brought by EMS for symptoms of Confusion  Patient had on 02/17/2018 eight  femoral-femoral bypass with thrombectomy and was in a rehabilitation facility. Over the last several days he has had cognitive decline, confusion, difficulty feeding himself, and trouble using urine bottle. On 10/30 used the hospital because he had symptoms of stroke and indeed he has a small area of restricted diffusion the right cerebellum. 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 He had fever and elevator white count was given a dose of Rocephin then start on Keflex. At the facility they found him leaning to one side and suggested he had left facial droop. He has no history of hypertension blood pressure is 130/73. He is diabetic is blood sugar was 199. He has peripheral vascular disease and has had an abdominal aortic aneurysm. He is anticoagulant with Eliquis. He is been a smoker up until recently.  CT head showed no acute hemorrhage or acute core infarct. CT head was reviewed.  There is history of hemorrhagic complications or intracranial hemorrhage. There is history of Recent Anticoagulants. There is no history of recent major surgery. There is history of recent stroke.  Examination: 1A: Level of Consciousness - Alert; keenly responsive + 0 1B: Ask Month and Age - Both Questions Right + 0 1C: Blink Eyes & Squeeze Hands - Performs Both Tasks + 0 2: Test Horizontal Extraocular Movements - Normal + 0 3: Test Visual Fields - No Visual Loss + 0 4: Test Facial Palsy (Use Grimace if Obtunded) - Normal symmetry + 0 5A: Test Left Arm Motor Drift - Drift, but doesn't hit bed + 1 5B:  Test Right Arm Motor Drift - No Drift for 10 Seconds + 0 6A: Test Left Leg Motor Drift - Drift, hits bed + 2 6B: Test Right Leg Motor Drift - Drift, hits bed + 2 7: Test Limb Ataxia (FNF/Heel-Shin) - No Ataxia + 0 8: Test Sensation - Normal; No sensory loss + 0 9: Test Language/Aphasia - Normal; No aphasia + 0 10: Test Dysarthria - Normal + 0 11:  Test Extinction/Inattention - No abnormality + 0  NIHSS Score: 5  Patient was informed the Neurology Consult would happen via TeleHealth consult by way of interactive audio and video telecommunications and consented to receiving care in this manner.  Due to the immediate potential for life-threatening deterioration due to underlying acute neurologic illness, I spent 35 minutes providing critical care. This time includes time for face to face visit via telemedicine, review of medical records, imaging studies and discussion of findings with providers, the patient and/or family.   Dr Elray Mcgregor   [facility] [phone]  [facility] Tele[facility]   Date of Service:   02/28/2018 16:38:50  Impression:     .  Toxic/metabolic encephalopathy No evidence at this time of acute ischemic stroke but he had a recent one as described.  Comments: Possible infection, systemic but not central nervous system  Metrics: Last Known Well: Unknown [facility] Notification Time: 02/28/2018 16:37:52 Arrival Time: 02/28/2018 16:13:00 Stamp Time: 02/28/2018 16:38:50 Time First Login Attempt: 02/28/2018 16:44:00 Video Start Time: 02/28/2018 16:44:00  Symptoms: Confusion NIHSS Start Assessment Time: 02/28/2018 16:51:00 Patient is not a candidate for tPA. Patient was not deemed candidate for tPA thrombolytics because of Current use of NOA's. Video End Time: 02/28/2018 16:58:00  CT head showed no acute hemorrhage or acute core infarct. CT head was reviewed.  Advanced imaging was not obtained as the presentation was not suggestive of Large Vessel Occlusive Disease.   Radiologist was not called back for review of advanced imaging because Not indicated ER Physician notified of the decision on thrombolytics management on 02/28/2018 16:57:00  Our recommendations are outlined below.  Recommendations:     .  Activate Stroke Protocol Admission/Order  Set     .  Stroke/Telemetry Floor     .  Neuro Checks     .  Bedside Swallow Eval     .  DVT Prophylaxis     .  IV Fluids, Normal Saline     .  Head of Bed Below 30 Degrees     .  Euglycemia and Avoid Hyperthermia (PRN Acetaminophen)     .  Continue antithrombotic  Routine Consultation with Inhouse Neurology for Follow up Care  Sign Out:     .  Discussed with Emergency Department Provider    ------------------------------------------------------------------------------  History of Present Illness: Patient is a 77 year old Male.  Patient was brought by EMS for symptoms of Confusion  Patient had on 02/17/2018 eight femoral-femoral bypass with thrombectomy and was in a rehabilitation facility. Over the last several days he has had cognitive decline, confusion, difficulty feeding himself, and trouble using urine bottle. On 10/30 used the hospital because he had symptoms of stroke and indeed he has a small area of restricted diffusion the right cerebellum. 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 He had fever and elevator white count was given a dose of Rocephin then start on Keflex. At the facility they found him leaning to one side and suggested he  had left facial droop. He has no history of hypertension blood pressure is 130/73. He is diabetic is blood sugar was 199. He has peripheral vascular disease and has had an abdominal aortic aneurysm. He is anticoagulant with Eliquis. He is been a smoker up until recently.  CT head showed no acute hemorrhage or acute core infarct. CT head was reviewed.  There is history of hemorrhagic complications or intracranial hemorrhage. There is history of Recent Anticoagulants. There is no history of recent major surgery. There is history of recent stroke.  Examination: 1A: Level of Consciousness - Alert; keenly responsive + 0 1B: Ask Month and Age - Both Questions Right + 0 1C: Blink  Eyes & Squeeze Hands - Performs Both Tasks + 0 2: Test Horizontal Extraocular Movements - Normal + 0 3: Test Visual Fields - No Visual Loss + 0 4: Test Facial Palsy (Use Grimace if Obtunded) - Normal symmetry + 0 5A: Test Left Arm Motor Drift - Drift, but doesn't hit bed + 1 5B: Test Right Arm Motor Drift - No Drift for 10 Seconds + 0 6A: Test Left Leg Motor Drift - Drift, hits bed + 2 6B: Test Right Leg Motor Drift - Drift, hits bed + 2 7: Test Limb Ataxia (FNF/Heel-Shin) - No Ataxia + 0 8: Test Sensation - Normal; No sensory loss + 0 9: Test Language/Aphasia - Normal; No aphasia + 0 10: Test Dysarthria - Normal + 0 11: Test Extinction/Inattention - No abnormality + 0  NIHSS Score: 5  Patient was informed the Neurology Consult would happen via TeleHealth consult by way of interactive audio and video telecommunications and consented to receiving care in this manner.  Due to the immediate potential for life-threatening deterioration due to underlying acute neurologic illness, I spent 35 minutes providing critical care. This time includes time for face to face visit via telemedicine, review of medical records, imaging studies and discussion of findings with providers, the patient and/or family.   Dr Elray Mcgregor   [facility] [phone]

## 2018-02-28 NOTE — ED Notes (Signed)
Pt taken to floor, report called to floor. VSS. NAD.

## 2018-02-28 NOTE — ED Notes (Signed)
Pts family at bedside. NAD. VSS. Call light within reach reach. Will continue to monitor.

## 2018-02-28 NOTE — ED Notes (Signed)
Pt is attempting to use the urinal at this time.

## 2018-02-28 NOTE — ED Notes (Signed)
1624 Code stroke activated.

## 2018-02-28 NOTE — Progress Notes (Signed)
Location:   [facility] Room Number: 204 A Place of Service:  SNF (31)   CODE STATUS: Full Code  No Known Allergies  Chief Complaint  Patient presents with  . Acute Visit    Change in condition    HPI:  On  02-17-18: he had a fogerty embolectomy; femoral femoral bypass thrombectomy femoral artery on right side.  Staff reports that over the past several days he has had a decline in his cognition and ability to perform his ADLs. He is unable to feed himself; is unable to manage his urinal; and is now unable to stand. He is confused: he is only oriented to self. There are reports of fevers. He was seen in the ED on Sunday was found to have leukocytosis and was started on kelfex after receiving a dose of rocephin in the hospital. His family as decided to have him return to the ED for further workup  There are no reports of injuries or falls.   Past Medical History:  Diagnosis Date  . AAA (abdominal aortic aneurysm) (HCC)   . Diabetes mellitus without complication [facility])     Past Surgical History:  Procedure Laterality Date  . ABDOMINAL AORTIC ANEURYSM REPAIR    . arm surgery Left   . EMBOLECTOMY Right 02/17/2018   Procedure: FOGERTY EMBOLECTOMY;  Surgeon: Annice Needy, MD;  Location: [facility] ORS;  Service: Vascular;  Laterality: Right;  . FEMORAL-FEMORAL BYPASS GRAFT Right 02/17/2018   Procedure: BYPASS FEMORAL-FEMORAL ARTERY;  Surgeon: Annice Needy, MD;  Location: [facility] ORS;  Service: Vascular;  Laterality: Right;  . THROMBECTOMY FEMORAL ARTERY Right 02/17/2018   Procedure: THROMBECTOMY FEMORAL ARTERY;  Surgeon: Annice Needy, MD;  Location: [facility] ORS;  Service: Vascular;  Laterality: Right;    Social History   Socioeconomic History  . Marital status: Married    Spouse name: Not on file  . Number of children: Not on file  . Years of education: Not on file  . Highest education level: Not on file  Occupational History  . Not on file  Social Needs  .  Financial resource strain: Not on file  . Food insecurity:    Worry: Not on file    Inability: Not on file  . Transportation needs:    Medical: Not on file    Non-medical: Not on file  Tobacco Use  . Smoking status: Heavy Tobacco Smoker  . Smokeless tobacco: Current User  Substance and Sexual Activity  . Alcohol use: Not Currently    Frequency: Never  . Drug use: Never  . Sexual activity: Not on file  Lifestyle  . Physical activity:    Days per week: Not on file    Minutes per session: Not on file  . Stress: Not on file  Relationships  . Social connections:    Talks on phone: Not on file    Gets together: Not on file    Attends religious service: Not on file    Active member of club or organization: Not on file    Attends meetings of clubs or organizations: Not on file    Relationship status: Not on file  . Intimate partner violence:    Fear of current or ex partner: Not on file    Emotionally abused: Not on file    Physically abused: Not on file    Forced sexual activity: Not on file  Other Topics Concern  . Not on file  Social History Narrative  .  Not on file   Family History  Problem Relation Age of Onset  . Alzheimer's disease Mother   . Heart disease Brother       VITAL SIGNS BP (!) 144/59   Pulse 92   Temp 98.4 F (36.9 C)   Resp 20   Ht 6\' 3"  (1.905 m)   Wt 148 lb 12.8 oz (67.5 kg)   SpO2 98%   BMI 18.60 kg/m   Outpatient Encounter Medications as of 02/28/2018  Medication Sig  . apixaban (ELIQUIS) 5 MG TABS tablet Take 1 tablet (5 mg total) by mouth 2 (two) times daily.  Marland Kitchen atorvastatin (LIPITOR) 40 MG tablet Take 1 tablet (40 mg total) by mouth daily at 6 PM.  . cephALEXin (KEFLEX) 500 MG capsule Take 1 capsule (500 mg total) by mouth 4 (four) times daily for 10 days.  . Cholecalciferol (D-5000) 5000 units TABS Take 5,000 Units by mouth daily.   . clopidogrel (PLAVIX) 75 MG tablet Take 75 mg by mouth daily.   Marland Kitchen co-enzyme Q-10 30 MG capsule Take  30 mg by mouth 3 (three) times daily.  . Coconut Oil 1000 MG CAPS Take 1,000 mg by mouth as needed (dry skin).   Marland Kitchen docusate sodium (COLACE) 100 MG capsule Take 100 mg by mouth 2 (two) times daily.  Marland Kitchen donepezil (ARICEPT) 5 MG tablet Take 5 mg by mouth at bedtime.   . gabapentin (NEURONTIN) 300 MG capsule Take 300 mg by mouth daily as needed (Pain).   Marland Kitchen GARLIC OIL PO Take 1 capsule by mouth daily as needed (cholesterol).   Marland Kitchen glimepiride (AMARYL) 2 MG tablet Take 2 mg by mouth daily with breakfast.   . metFORMIN (GLUCOPHAGE) 500 MG tablet Take 500 mg by mouth daily.  . metoprolol tartrate (LOPRESSOR) 25 MG tablet Take 25 mg by mouth daily.  . Misc Natural Products (PROSTATE THERAPY COMPLEX PO) Take 562 mg by mouth daily.   . nicotine (NICODERM CQ - DOSED IN MG/24 HOURS) 21 mg/24hr patch Place 1 patch (21 mg total) onto the skin daily.  . NON FORMULARY Diet Type:  NAS  . Red Yeast Rice 600 MG CAPS Take 1 capsule by mouth daily.   . traMADol (ULTRAM) 50 MG tablet Take 1 tablet (50 mg total) by mouth every 6 (six) hours as needed for moderate pain.  . vitamin B-12 (CYANOCOBALAMIN) 1000 MCG tablet Take 1,000 mcg by mouth daily.    No facility-administered encounter medications on file as of 02/28/2018.      SIGNIFICANT DIAGNOSTIC EXAMS  TODAY:   02-21-18: MRI of brain:  1. 9 mm focus of subtle diffusion abnormality within the right cerebellar hemisphere, most likely a small subacute ischemic infarct. No associated hemorrhage or mass effect. Finding is felt to most likely be incidental in nature. 2. No other acute intracranial abnormality. 3. Chronic hemorrhagic right PCA territory infarct. 4. Moderate cerebral atrophy with chronic small vessel ischemic disease, mildly progressed relative to 2016. 5. Diffuse ventricular prominence most likely related to global parenchymal atrophy, similar to previous.  02-21-18: chest x-ray Decreased lung volumes with minimal basilar atelectasis  LABS  REVIEWED TODAY:   02-25-18: wbc 17.2; hgb 12.9; hct 38.2; mcv 97.2; plt 313; glucose 267; bun 18; creat 1.02; k+ 4.7; na++ 135; ca 9.4; liver normal albumin 3.8 blood culture: no growth  02-25-18: (repeat) wbc 16.1; hgb 11.4; hct 33.0; mcv 95.4 plt 283    Review of Systems  Unable to perform ROS: Other (confusion )  Physical Exam  Constitutional: No distress.  Frail   Neck: No thyromegaly present.  Cardiovascular: Normal rate, regular rhythm, normal heart sounds and intact distal pulses.  Pulmonary/Chest: Effort normal and breath sounds normal. No respiratory distress.  Abdominal: Soft. Bowel sounds are normal. He exhibits no distension. There is no tenderness.  Musculoskeletal: He exhibits no edema.  Is able to move all extremities   Lymphadenopathy:    He has no cervical adenopathy.  Neurological: He is alert.  Skin: Skin is warm and dry. He is not diaphoretic.  Incisional line without signs of infection present.   Psychiatric: He has a normal mood and affect.      ASSESSMENT/ PLAN:  TODAY:  1. Altered mental status 2. Leukocytosis  Will send him to the ED for further workup and evaluation.      MD is aware of resident's narcotic use and is in agreement with current plan of care. We will attempt to wean resident as apropriate   Synthia Innocent NP [facility]  Contact [phone] Monday through Friday 8am- 5pm  After hours call [phone]

## 2018-02-28 NOTE — Progress Notes (Signed)
CODE STROKE- PHARMACY COMMUNICATION   Time CODE STROKE called/page received:11/6 @ 9983   Time response to CODE STROKE was made (in person or via phone):  1632  Time Stroke Kit retrieved from [facility] (only if needed):n/a  Name of Provider/Nurse contacted:n/a   Past Medical History:  Diagnosis Date  . AAA (abdominal aortic aneurysm) ([place])   . Diabetes mellitus without complication ([place])    Prior to Admission medications   Medication Sig Start Date End Date Taking? Authorizing Provider  apixaban (ELIQUIS) 5 MG TABS tablet Take 1 tablet (5 mg total) by mouth 2 (two) times daily. 02/23/18  Yes Vaughan Basta, MD  atorvastatin (LIPITOR) 40 MG tablet Take 1 tablet (40 mg total) by mouth daily at 6 PM. 02/23/18  Yes Vaughan Basta, MD  cephALEXin (KEFLEX) 500 MG capsule Take 1 capsule (500 mg total) by mouth 4 (four) times daily for 10 days. 02/25/18 03/07/18 Yes Earleen Newport, MD  Cholecalciferol (D-5000) 5000 units TABS Take 5,000 Units by mouth daily.    Yes [provider]  clopidogrel (PLAVIX) 75 MG tablet Take 75 mg by mouth daily.    Yes [provider]  co-enzyme Q-10 30 MG capsule Take 30 mg by mouth 3 (three) times daily.   Yes [provider]  Coconut Oil 1000 MG CAPS Take 1,000 mg by mouth as needed (dry skin).    Yes [provider]  docusate sodium (COLACE) 100 MG capsule Take 100 mg by mouth 2 (two) times daily.   Yes [provider]  donepezil (ARICEPT) 5 MG tablet Take 5 mg by mouth at bedtime.  02/15/17  Yes [provider]  gabapentin (NEURONTIN) 300 MG capsule Take 300 mg by mouth daily as needed (Pain).  03/15/16  Yes [provider]  Garlic Oil 3825 MG CAPS Take 1,000 mg by mouth daily as needed (cholesterol).    Yes [provider]  glimepiride (AMARYL) 2 MG tablet Take 2 mg by mouth daily with breakfast.  03/14/16  Yes [provider]  metFORMIN (GLUCOPHAGE-XR) 500 MG 24  hr tablet Take 500 mg by mouth daily with breakfast.   Yes [provider]  metoprolol succinate (TOPROL-XL) 25 MG 24 hr tablet Take 25 mg by mouth daily.   Yes [provider]  Misc Natural Products (PROSTATE THERAPY COMPLEX PO) Take 562 mg by mouth daily.    Yes [provider]  nicotine (NICODERM CQ - DOSED IN MG/24 HOURS) 21 mg/24hr patch Place 1 patch (21 mg total) onto the skin daily. 02/24/18  Yes Vaughan Basta, MD  Red Yeast Rice 600 MG CAPS Take 1 capsule by mouth daily.    Yes [provider]  traMADol (ULTRAM) 50 MG tablet Take 1 tablet (50 mg total) by mouth every 6 (six) hours as needed for moderate pain. 02/27/18  Yes Gerlene Fee, NP  vitamin B-12 (CYANOCOBALAMIN) 1000 MCG tablet Take 1,000 mcg by mouth daily.    Yes [provider]  metFORMIN (GLUCOPHAGE) 500 MG tablet Take 500 mg by mouth daily. 02/26/18 02/28/18  [provider]  metoprolol tartrate (LOPRESSOR) 25 MG tablet Take 25 mg by mouth daily. 02/26/18 02/28/18  [provider]  NON FORMULARY Diet Type:  NAS  02/28/18  [provider]    Noralee Space ,PharmD Clinical Pharmacist  02/28/2018  6:28 PM

## 2018-02-28 NOTE — Progress Notes (Signed)
   02/28/18 1840  Clinical Encounter Type  Visited With Patient  Visit Type Follow-up   When chaplain followed up with patient, he indicated that he had no needs at present.  Chaplain encouraged patient to page for chaplain if needs changed.

## 2018-02-28 NOTE — H&P (Signed)
[facility] - [place] [facility]   PATIENT NAME: Kurt [name]    MR#:  [id number]  DATE OF BIRTH:  1940-11-26  DATE OF ADMISSION:  02/28/2018  PRIMARY CARE PHYSICIAN: Jaclyn Shaggy, MD   REQUESTING/REFERRING PHYSICIAN: Dr. Scotty Court facial  CHIEF COMPLAINT: Left facial droop   Chief Complaint  Patient presents with  . Facial Droop    HISTORY OF PRESENT ILLNESS:  Kurt [name]  is a 77 y.o. male with a known history of hypertension, diabetes mellitus type 2, recent femoral femoral-femoral bypass graft by Dr. Wyn Quaker and discharged from hospital on November 10.  To [facility] rehab ended to ED because of confusion, left facial droop.  Patient very alert and awake during my exam, patient denies any problems with the mouth, slurred speech.  He says that he did not have any troubles.  Patient is on Eliquis, I did not see any neurological deficit on him.  Telemetry neurology is contacted, recommended overnight observation and gets full stroke work-up.  Patient has no slurred speech, told me that he is in usual state of health denies any complaints.  No weakness in hands, legs, no slurred speech.  I did not see any facial droop.  Initial CT of the head did not show any new changes.  Blood pressure found to be elevated at 137/113. PAST MEDICAL HISTORY:   Past Medical History:  Diagnosis Date  . AAA (abdominal aortic aneurysm) (HCC)   . Diabetes mellitus without complication (HCC)     PAST SURGICAL HISTOIRY:   Past Surgical History:  Procedure Laterality Date  . ABDOMINAL AORTIC ANEURYSM REPAIR    . arm surgery Left   . EMBOLECTOMY Right 02/17/2018   Procedure: FOGERTY EMBOLECTOMY;  Surgeon: Annice Needy, MD;  Location: [facility] ORS;  Service: Vascular;  Laterality: Right;  . FEMORAL-FEMORAL BYPASS GRAFT Right 02/17/2018   Procedure: BYPASS FEMORAL-FEMORAL ARTERY;  Surgeon: Annice Needy, MD;  Location: [facility] ORS;  Service: Vascular;  Laterality: Right;  . THROMBECTOMY  FEMORAL ARTERY Right 02/17/2018   Procedure: THROMBECTOMY FEMORAL ARTERY;  Surgeon: Annice Needy, MD;  Location: [facility] ORS;  Service: Vascular;  Laterality: Right;    SOCIAL HISTORY:   Social History   Tobacco Use  . Smoking status: Heavy Tobacco Smoker  . Smokeless tobacco: Current User  Substance Use Topics  . Alcohol use: Not Currently    Frequency: Never    FAMILY HISTORY:   Family History  Problem Relation Age of Onset  . Alzheimer's disease Mother   . Heart disease Brother     DRUG ALLERGIES:  No Known Allergies  REVIEW OF SYSTEMS:  CONSTITUTIONAL: No fever, fatigue or weakness.  EYES: No blurred or double vision.  EARS, NOSE, AND THROAT: No tinnitus or ear pain.  RESPIRATORY: Has some cough. CARDIOVASCULAR: No chest pain, orthopnea, edema.  GASTROINTESTINAL: No nausea, vomiting, diarrhea or abdominal pain.  GENITOURINARY: No dysuria, hematuria.  ENDOCRINE: No polyuria, nocturia,  HEMATOLOGY: No anemia, easy bruising or bleeding SKIN: No rash or lesion. MUSCULOSKELETAL: No joint pain or arthritis.   NEUROLOGIC: No tingling, numbness, weakness.  PSYCHIATRY: No anxiety or depression.   MEDICATIONS AT HOME:   Prior to Admission medications   Medication Sig Start Date End Date Taking? Authorizing Provider  apixaban (ELIQUIS) 5 MG TABS tablet Take 1 tablet (5 mg total) by mouth 2 (two) times daily. 02/23/18  Yes Altamese Dilling, MD  atorvastatin (LIPITOR) 40 MG tablet Take 1 tablet (40 mg total) by  mouth daily at 6 PM. 02/23/18  Yes Altamese Dilling, MD  cephALEXin (KEFLEX) 500 MG capsule Take 1 capsule (500 mg total) by mouth 4 (four) times daily for 10 days. 02/25/18 03/07/18 Yes Emily Filbert, MD  Cholecalciferol (D-5000) 5000 units TABS Take 5,000 Units by mouth daily.    Yes [provider]  clopidogrel (PLAVIX) 75 MG tablet Take 75 mg by mouth daily.    Yes [provider]  co-enzyme Q-10 30 MG capsule Take 30 mg by mouth 3  (three) times daily.   Yes [provider]  Coconut Oil 1000 MG CAPS Take 1,000 mg by mouth as needed (dry skin).    Yes [provider]  docusate sodium (COLACE) 100 MG capsule Take 100 mg by mouth 2 (two) times daily.   Yes [provider]  donepezil (ARICEPT) 5 MG tablet Take 5 mg by mouth at bedtime.  02/15/17  Yes [provider]  gabapentin (NEURONTIN) 300 MG capsule Take 300 mg by mouth daily as needed (Pain).  03/15/16  Yes [provider]  Garlic Oil 1000 MG CAPS Take 1,000 mg by mouth daily as needed (cholesterol).    Yes [provider]  glimepiride (AMARYL) 2 MG tablet Take 2 mg by mouth daily with breakfast.  03/14/16  Yes [provider]  metFORMIN (GLUCOPHAGE-XR) 500 MG 24 hr tablet Take 500 mg by mouth daily with breakfast.   Yes [provider]  metoprolol succinate (TOPROL-XL) 25 MG 24 hr tablet Take 25 mg by mouth daily.   Yes [provider]  Misc Natural Products (PROSTATE THERAPY COMPLEX PO) Take 562 mg by mouth daily.    Yes [provider]  nicotine (NICODERM CQ - DOSED IN MG/24 HOURS) 21 mg/24hr patch Place 1 patch (21 mg total) onto the skin daily. 02/24/18  Yes Altamese Dilling, MD  Red Yeast Rice 600 MG CAPS Take 1 capsule by mouth daily.    Yes [provider]  traMADol (ULTRAM) 50 MG tablet Take 1 tablet (50 mg total) by mouth every 6 (six) hours as needed for moderate pain. 02/27/18  Yes Sharee Holster, NP  vitamin B-12 (CYANOCOBALAMIN) 1000 MCG tablet Take 1,000 mcg by mouth daily.    Yes [provider]      VITAL SIGNS:  Blood pressure (!) 137/113, pulse 98, temperature 98.5 F (36.9 C), temperature source Oral, resp. rate 14, height 6\' 3"  (1.905 m), weight 67.5 kg, SpO2 96 %.  PHYSICAL EXAMINATION:  GENERAL:  77 y.o.-year-old patient lying in the bed with no acute distress.  EYES: Pupils equal, round, reactive to light and accommodation. No  scleral icterus. Extraocular muscles intact.  HEENT: Head atraumatic, normocephalic. Oropharynx and nasopharynx clear.  NECK:  Supple, no jugular venous distention. No thyroid enlargement, no tenderness.  LUNGS: Normal breath sounds bilaterally, no wheezing, rales,rhonchi or crepitation. No use of accessory muscles of respiration.  CARDIOVASCULAR: S1, S2 normal. No murmurs, rubs, or gallops.  ABDOMEN: Soft, nontender, nondistended. Bowel sounds present. No organomegaly or mass.  EXTREMITIES: No pedal edema, cyanosis, or clubbing.  NEUROLOGIC: Cranial nerves II through XII are intact. Muscle strength 5/5 in all extremities. Sensation intact. Gait not checked.  PSYCHIATRIC: The patient is alert and oriented x 3.  SKIN: No obvious rash, lesion, or ulcer.   LABORATORY PANEL:   CBC Recent Labs  Lab 02/28/18 1624  WBC 11.1*  HGB 12.5*  HCT 35.9*  PLT 351   ------------------------------------------------------------------------------------------------------------------  Chemistries  Recent  Labs  Lab 02/22/18 0057  02/28/18 1624  NA 139   < > 133*  K 4.0   < > 3.7  CL 106   < > 97*  CO2 24   < > 25  GLUCOSE 149*   < > 184*  BUN 19   < > 18  CREATININE 1.15   < > 1.10  CALCIUM 9.0   < > 9.4  MG 1.6*  --   --   AST  --    < > 69*  ALT  --    < > 71*  ALKPHOS  --    < > 124  BILITOT  --    < > 1.0   < > = values in this interval not displayed.   ------------------------------------------------------------------------------------------------------------------  Cardiac Enzymes Recent Labs  Lab 02/28/18 1624  TROPONINI 0.05*   ------------------------------------------------------------------------------------------------------------------  RADIOLOGY:  Ct Head Code Stroke Wo Contrast  Result Date: 02/28/2018 CLINICAL DATA:  Code stroke. Last seen normal 1400 hours. Leaning to LEFT. Weakness. LEFT facial droop. EXAM: CT HEAD WITHOUT CONTRAST TECHNIQUE: Contiguous axial  images were obtained from the base of the skull through the vertex without intravenous contrast. COMPARISON:  MR head 02/21/2018. FINDINGS: Brain: No evidence for acute infarction, hemorrhage, mass lesion, or extra-axial fluid. Generalized atrophy. Hydrocephalus ex vacuo. Extensive hypoattenuation of white matter correlates with the MR findings of chronic microvascular ischemic change. There is an old RIGHT PCA territory infarct affecting much of the temporal and occipital lobes. Vascular: Calcification of the cavernous internal carotid arteries consistent with cerebrovascular atherosclerotic disease. No signs of intracranial large vessel occlusion. Skull: Calvarium intact. Sinuses/Orbits: No acute finding. Other: None. ASPECTS [facility] Stroke Program Early CT Score) - Ganglionic level infarction (caudate, lentiform nuclei, internal capsule, insula, M1-M3 cortex): 7 - Supraganglionic infarction (M4-M6 cortex): 3 Total score (0-10 with 10 being normal): 10 IMPRESSION: 1. Advanced atrophy. Extensive small vessel disease. No acute intracranial findings. 2. ASPECTS is 10. 3. These results were called by telephone at the time of interpretation on 02/28/2018 at 4:44 pm to Dr. Sharman Cheek , who verbally acknowledged these results. Electronically Signed   By: Elsie Stain M.D.   On: 02/28/2018 16:46    EKG:   Orders placed or performed during the hospital encounter of 02/28/18  . ED EKG  . ED EKG  . EKG 12-Lead  . EKG 12-Lead  EKG shows normal sinus rhythm with 98 bpm, no ST-T changes. IMPRESSION AND PLAN:   77 year old male patient with history of diabetes mellitus type 2, abdominal aortic aneurysm, PVD, recent right leg revascularization by vascular sent to [facility] after prolonged hospitalization recently and discharged on November 1 comes back because of possible left facial droop noticed by the staff.  Telemetry neurologist consulted, recommended overnight observation, full stroke work-up.  Patient  already on Eliquis, he denies any complaints.  I really do not think he has any acute event but because it is late in the night he does not want to go back to rehab.  #1. left facial droop, resolved now I do not see any facial droop now.  CT of the head is unremarkable for any acute changes.  It showed advanced atrophy, extensive small vessel disease.  I ordered MRI of the brain out contrast.  Patient already on Plavix, Eliquis.  Doubt any acute stroke.  Echo showed EF 25%.  So will not repeat any echo.  Watch him overnight, follow neurochecks, likely discharge back to [facility] tomorrow. #2 recent  right leg ischemia status post vascular surgery ;'s, Eliquis. 3.  History of V. tach, seen by Dr. Lady Gary during last admission, EF 25%, patient is on beta-blockers.  Heart rate is stable.  Monitor on off unit telemetry. 4.  Diabetes mellitus type 2: Continue Amaryl, sliding scale insulin with coverage.    ;All the records are reviewed and case discussed with ED provider. Management plans discussed with the patient, family and they are in agreement.  CODE STATUS: full  TOTAL TIME TAKING CARE OF THIS PATIENT:55 minutes.    Katha Hamming M.D on 02/28/2018 at 8:51 PM  Between 7am to 6pm - Pager - [phone]  After 6pm go to [email or web address] - password [email or web address] [facility]  [place] [facility]  Office  [phone]  CC: Primary care physician; Jaclyn Shaggy, MD  Note: This dictation was prepared with Dragon dictation along with smaller phrase technology. Any transcriptional errors that result from this process are unintentional.

## 2018-02-28 NOTE — ED Notes (Signed)
Date and time results received: 02/28/18 5:31 PM  Test: Troponin Critical Value: 0.05  Name of Provider Notified: Dr. Scotty Court  Orders Received? Or Actions Taken?: Dr aware no new orders at this time.

## 2018-03-01 ENCOUNTER — Observation Stay: Payer: Medicare Other

## 2018-03-01 LAB — HEMOGLOBIN A1C
Hgb A1c MFr Bld: 6.6 % — ABNORMAL HIGH (ref 4.8–5.6)
MEAN PLASMA GLUCOSE: 142.72 mg/dL

## 2018-03-01 LAB — GLUCOSE, CAPILLARY: GLUCOSE-CAPILLARY: 136 mg/dL — AB (ref 70–99)

## 2018-03-01 LAB — LIPID PANEL
Cholesterol: 168 mg/dL (ref 0–200)
HDL: 31 mg/dL — ABNORMAL LOW (ref >40)
LDL Cholesterol: 110 mg/dL — ABNORMAL HIGH (ref 0–99)
Total CHOL/HDL Ratio: 5.4 RATIO
Triglycerides: 137 mg/dL (ref <150)
VLDL: 27 mg/dL (ref 0–40)

## 2018-03-01 NOTE — Clinical Social Work Note (Signed)
Clinical Social Work Assessment  Patient Details  Name: Kurt [name] MRN: [id number] Date of Birth: 1940-06-08  Date of referral:  03/01/18               Reason for consult:  Facility Placement                Permission sought to share information with:  Case Manager, Magazine features editor, Family Supports Permission granted to share information::  Yes, Verbal Permission Granted  Name::        Agency::     Relationship::     Contact Information:     Housing/Transportation Living arrangements for the past 2 months:  Skilled Building surveyor of Information:  Patient Patient Interpreter Needed:  None Criminal Activity/Legal Involvement Pertinent to Current Situation/Hospitalization:  No - Comment as needed Significant Relationships:  Siblings Lives with:  Facility Resident Do you feel safe going back to the place where you live?  Yes Need for family participation in patient care:  Yes (Comment)  Care giving concerns:  Patient admitted from [facility]    Social Worker assessment / plan:  CSW consulted for facility placement. Patient is from [facility] for rehab. CSW spoke with patient to discuss discharge. Patient confirms that he is from [facility] and would like to return to continue rehab. CSW explained that there is a discharge order for today. Patient is in agreement with discharge back to [facility]. CSW notified Ladona Ridgel at [facility] of discharge today. Patient will be transported by EMS. RN to call report and call for transport.   Employment status:  Retired Database administrator PT Recommendations:  Not assessed at this time Information / Referral to community resources:     Patient/Family's Response to care:  Patient thanked CSW for assistance   Patient/Family's Understanding of and Emotional Response to Diagnosis, Current Treatment, and Prognosis:  Patient in agreement with discharge plan   Emotional Assessment Appearance:   Appears stated age Attitude/Demeanor/Rapport:    Affect (typically observed):  Accepting, Pleasant Orientation:  Oriented to Self, Oriented to Place, Oriented to  Time Alcohol / Substance use:  Not Applicable Psych involvement (Current and /or in the community):  No (Comment)  Discharge Needs  Concerns to be addressed:  Discharge Planning Concerns Readmission within the last 30 days:  Yes Current discharge risk:  None Barriers to Discharge:  Continued Medical Work up   [employer], LCSWA 03/01/2018, 9:30 AM

## 2018-03-01 NOTE — Progress Notes (Signed)
OT Cancellation Note  Patient Details Name: Kurt [name] MRN: [id number] DOB: [birth year]   Cancelled Treatment:    Reason Eval/Treat Not Completed: Other (comment). Order received, chart reviewed. Spoke with PT. Per attending physician Imogene Burn), CVA work-up negative at this time.  Patient now returned to baseline, and planning for return to [facility].  No need for OT evaluation prior to discharge.  Please notify OT should needs change.)   Richrd Prime, MPH, MS, OTR/L ascom [phone] 03/01/18, 10:58 AM

## 2018-03-01 NOTE — Progress Notes (Signed)
Patient discharged to [facility] per MD order. Report called to Tabitha at facility. EMS called for transport.

## 2018-03-01 NOTE — Discharge Summary (Signed)
[facility] -  at [facility]   PATIENT NAME: Kurt [name]    MR#:  [id number]  DATE OF BIRTH:  1940/09/09  DATE OF ADMISSION:  02/28/2018   ADMITTING PHYSICIAN: Katha Hamming, MD  DATE OF DISCHARGE:  03/01/2018 PRIMARY CARE PHYSICIAN: Jaclyn Shaggy, MD   ADMISSION DIAGNOSIS:  Left arm weakness [R29.898] DISCHARGE DIAGNOSIS:  Active Problems:   Facial droop  SECONDARY DIAGNOSIS:   Past Medical History:  Diagnosis Date  . AAA (abdominal aortic aneurysm) (HCC)   . Diabetes mellitus without complication [facility])    HOSPITAL COURSE:  77 year old male patient with history of diabetes mellitus type 2, abdominal aortic aneurysm, PVD, recent right leg revascularization by vascular sent to [facility] after prolonged hospitalization recently and discharged on November 1 comes back because of possible left facial droop noticed by the staff.  Telemetry neurologist consulted, recommended overnight observation, full stroke work-up.  Patient already on Eliquis, he denies any complaints.  I really do not think he has any acute event but because it is late in the night he does not want to go back to rehab.  #1. left facial droop, resolved..  CT of the head is unremarkable for any acute changes.  It showed advanced atrophy, extensive small vessel disease. MRI of the brain: No acute CVA.  Patient already on Plavix, Eliquis.  Echo showed EF 25%.    #2 recent right leg ischemia status post vascular surgery ;'s, Eliquis. 3.  History of V. tach, seen by Dr. Lady Gary during last admission, EF 25%, patient is on beta-blockers.  Heart rate is stable.   4.  Diabetes mellitus type 2: Continue Amaryl, sliding scale insulin with coverage.  DISCHARGE CONDITIONS:  Stable, discharge to SNF today. CONSULTS OBTAINED:   DRUG ALLERGIES:  No Known Allergies DISCHARGE MEDICATIONS:   Allergies as of 03/01/2018   No Known Allergies     Medication List    TAKE these medications     apixaban 5 MG Tabs tablet Commonly known as:  ELIQUIS Take 1 tablet (5 mg total) by mouth 2 (two) times daily.   atorvastatin 40 MG tablet Commonly known as:  LIPITOR Take 1 tablet (40 mg total) by mouth daily at 6 PM.   cephALEXin 500 MG capsule Commonly known as:  KEFLEX Take 1 capsule (500 mg total) by mouth 4 (four) times daily for 10 days.   clopidogrel 75 MG tablet Commonly known as:  PLAVIX Take 75 mg by mouth daily.   co-enzyme Q-10 30 MG capsule Take 30 mg by mouth 3 (three) times daily.   Coconut Oil 1000 MG Caps Take 1,000 mg by mouth as needed (dry skin).   D-5000 125 MCG (5000 UT) Tabs Generic drug:  Cholecalciferol Take 5,000 Units by mouth daily.   docusate sodium 100 MG capsule Commonly known as:  COLACE Take 100 mg by mouth 2 (two) times daily.   donepezil 5 MG tablet Commonly known as:  ARICEPT Take 5 mg by mouth at bedtime.   gabapentin 300 MG capsule Commonly known as:  NEURONTIN Take 300 mg by mouth daily as needed (Pain).   Garlic Oil 1000 MG Caps Take 1,000 mg by mouth daily as needed (cholesterol).   glimepiride 2 MG tablet Commonly known as:  AMARYL Take 2 mg by mouth daily with breakfast.   metFORMIN 500 MG 24 hr tablet Commonly known as:  GLUCOPHAGE-XR Take 500 mg by mouth daily with breakfast.   metoprolol succinate 25 MG 24 hr  tablet Commonly known as:  TOPROL-XL Take 25 mg by mouth daily.   nicotine 21 mg/24hr patch Commonly known as:  NICODERM CQ - dosed in mg/24 hours Place 1 patch (21 mg total) onto the skin daily.   PROSTATE THERAPY COMPLEX PO Take 562 mg by mouth daily.   Red Yeast Rice 600 MG Caps Take 1 capsule by mouth daily.   traMADol 50 MG tablet Commonly known as:  ULTRAM Take 1 tablet (50 mg total) by mouth every 6 (six) hours as needed for moderate pain.   vitamin B-12 1000 MCG tablet Commonly known as:  CYANOCOBALAMIN Take 1,000 mcg by mouth daily.        DISCHARGE INSTRUCTIONS:  See AVS.  If  you experience worsening of your admission symptoms, develop shortness of breath, life threatening emergency, suicidal or homicidal thoughts you must seek medical attention immediately by calling 911 or calling your MD immediately  if symptoms less severe.  You Must read complete instructions/literature along with all the possible adverse reactions/side effects for all the Medicines you take and that have been prescribed to you. Take any new Medicines after you have completely understood and accpet all the possible adverse reactions/side effects.   Please note  You were cared for by a hospitalist during your hospital stay. If you have any questions about your discharge medications or the care you received while you were in the hospital after you are discharged, you can call the unit and asked to speak with the hospitalist on call if the hospitalist that took care of you is not available. Once you are discharged, your primary care physician will handle any further medical issues. Please note that NO REFILLS for any discharge medications will be authorized once you are discharged, as it is imperative that you return to your primary care physician (or establish a relationship with a primary care physician if you do not have one) for your aftercare needs so that they can reassess your need for medications and monitor your lab values.    On the day of Discharge:  VITAL SIGNS:  Blood pressure 122/67, pulse 85, temperature 98.7 F (37.1 C), temperature source Oral, resp. rate 16, height 6\' 3"  (1.905 m), weight 67.5 kg, SpO2 95 %. PHYSICAL EXAMINATION:  GENERAL:  77 y.o.-year-old patient lying in the bed with no acute distress.  EYES: Pupils equal, round, reactive to light and accommodation. No scleral icterus. Extraocular muscles intact.  HEENT: Head atraumatic, normocephalic. Oropharynx and nasopharynx clear.  NECK:  Supple, no jugular venous distention. No thyroid enlargement, no tenderness.  LUNGS:  Normal breath sounds bilaterally, no wheezing, rales,rhonchi or crepitation. No use of accessory muscles of respiration.  CARDIOVASCULAR: S1, S2 normal. No murmurs, rubs, or gallops.  ABDOMEN: Soft, non-tender, non-distended. Bowel sounds present. No organomegaly or mass.  EXTREMITIES: No pedal edema, cyanosis, or clubbing.  NEUROLOGIC: Cranial nerves II through XII are intact. Muscle strength 3-4/5 in all extremities. Sensation intact. Gait not checked.  PSYCHIATRIC: The patient is alert and oriented x 3.  SKIN: No obvious rash, lesion, or ulcer.  DATA REVIEW:   CBC Recent Labs  Lab 02/28/18 1624  WBC 11.1*  HGB 12.5*  HCT 35.9*  PLT 351    Chemistries  Recent Labs  Lab 02/28/18 1624  NA 133*  K 3.7  CL 97*  CO2 25  GLUCOSE 184*  BUN 18  CREATININE 1.10  CALCIUM 9.4  AST 69*  ALT 71*  ALKPHOS 124  BILITOT 1.0  Microbiology Results  Results for orders placed or performed during the hospital encounter of 02/25/18  Blood culture (routine x 2)     Status: None (Preliminary result)   Collection Time: 02/25/18  8:46 PM  Result Value Ref Range Status   Specimen Description BLOOD LEFT ARM  Final   Special Requests   Final    BOTTLES DRAWN AEROBIC AND ANAEROBIC Blood Culture results may not be optimal due to an excessive volume of blood received in culture bottles   Culture   Final    NO GROWTH 4 DAYS Performed at [facility], [address] [place], [place] [address]    Report Status PENDING  Incomplete  Blood culture (routine x 2)     Status: None (Preliminary result)   Collection Time: 02/25/18  8:46 PM  Result Value Ref Range Status   Specimen Description BLOOD RIGHT HAND  Final   Special Requests   Final    BOTTLES DRAWN AEROBIC AND ANAEROBIC Blood Culture adequate volume   Culture   Final    NO GROWTH 4 DAYS Performed at [facility], [address] [place], [place] [address]    Report Status PENDING  Incomplete    RADIOLOGY:    [name]  Result Date: 03/01/2018 CLINICAL DATA:  Confusion and left facial droop EXAM: MRI HEAD WITHOUT CONTRAST TECHNIQUE: Multiplanar, multiecho pulse sequences of the brain and surrounding structures were obtained without intravenous contrast. COMPARISON:  Head CT 02/28/2018 FINDINGS: The study is degraded by motion, despite efforts to reduce this artifact, including utilization of motion-resistant MR sequences. The findings of the study are interpreted in the context of reduced sensitivity/specificity. BRAIN: There is no acute infarct, acute hemorrhage, hydrocephalus or extra-axial collection. 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 Early confluent hyperintense T2-weighted signal of the periventricular and deep white matter, most commonly due to chronic ischemic microangiopathy. Advanced atrophy for age. Susceptibility-sensitive sequences show no chronic microhemorrhage or superficial siderosis. VASCULAR: Major intracranial arterial and venous sinus flow voids are normal. SKULL AND UPPER CERVICAL SPINE: Calvarial bone marrow signal is normal. There is no skull base mass. Visualized upper cervical spine and soft tissues are normal. SINUSES/ORBITS: No fluid levels or advanced mucosal thickening. No mastoid or middle ear effusion. The orbits are normal. IMPRESSION: 1. No acute intracranial abnormality. 2. Advanced global atrophy and chronic ischemic microangiopathy. 3. 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 Electronically Signed   By: Deatra Robinson M.D.   On: 03/01/2018 02:51   Ct Head Code Stroke Wo Contrast  Result Date: 02/28/2018 CLINICAL DATA:  Code stroke. Last seen normal 1400 hours. Leaning to LEFT. Weakness. LEFT facial droop. EXAM: CT HEAD WITHOUT CONTRAST TECHNIQUE: Contiguous axial images were obtained from the base of the skull through the vertex without intravenous contrast. COMPARISON:  MR head 02/21/2018. FINDINGS: Brain: No evidence  for acute infarction, hemorrhage, mass lesion, or extra-axial fluid. Generalized atrophy. Hydrocephalus ex vacuo. Extensive hypoattenuation of white matter correlates with the MR findings of chronic microvascular ischemic change. There is an old RIGHT PCA territory infarct affecting much of the temporal and occipital lobes. Vascular: Calcification of the cavernous internal carotid arteries consistent with cerebrovascular atherosclerotic disease. No signs of intracranial large vessel occlusion. Skull: Calvarium intact. Sinuses/Orbits: No acute finding. Other: None. ASPECTS [facility] Stroke Program Early CT Score) - Ganglionic level infarction (caudate, lentiform nuclei, internal capsule, insula, M1-M3 cortex): 7 - Supraganglionic infarction (M4-M6 cortex): 3 Total score (0-10  with 10 being normal): 10 IMPRESSION: 1. Advanced atrophy. Extensive small vessel disease. No acute intracranial findings. 2. ASPECTS is 10. 3. These results were called by telephone at the time of interpretation on 02/28/2018 at 4:44 pm to Dr. Sharman Cheek , who verbally acknowledged these results. Electronically Signed   By: Elsie Stain M.D.   On: 02/28/2018 16:46     Management plans discussed with the patient, family and they are in agreement.  CODE STATUS: Full Code   TOTAL TIME TAKING CARE OF THIS PATIENT: 28 minutes.    Shaune Pollack M.D on 03/01/2018 at 8:56 AM  Between 7am to 6pm - Pager - [phone]  After 6pm go to [email or web address] - Social research officer, government  [facility]  Office  [phone]  CC: Primary care physician; Jaclyn Shaggy, MD   Note: This dictation was prepared with Dragon dictation along with smaller phrase technology. Any transcriptional errors that result from this process are unintentional.

## 2018-03-01 NOTE — Care Management Note (Signed)
Case Management Note  Patient Details  Name: Kurt [name] MRN: [id number] Date of Birth: 09/23/1940  Subjective/Objective:      Admitted to [facility] under observation status with the diagnosis of facial droop. Sister is Leron Croak [phone]). Primary care physician is Einar Crow.  Discharged from [facility] to [facility] 02/23/18.               Action/Plan: Observation letter explained and issued to Mr. Raisanen   Expected Discharge Date:  03/01/18               Expected Discharge Plan:     In-House Referral:   yes  Discharge planning Services     Post Acute Care Choice:    Choice offered to:     DME Arranged:    DME Agency:     HH Arranged:    HH Agency:     Status of Service:     If discussed at [employer], dates discussed:    Additional Comments:  Gwenette Greet, RN MSN CCM Care Management [phone] 03/01/2018, 9:08 AM

## 2018-03-01 NOTE — Care Management Obs Status (Signed)
MEDICARE OBSERVATION STATUS NOTIFICATION   Patient Details  Name: CHAU SAWIN MRN: [id number] Date of Birth: Nov 07, 1940   Medicare Observation Status Notification Given:  Yes: Explained to Mr. Sloane Junkin, RN 03/01/2018, 8:55 AM

## 2018-03-01 NOTE — Progress Notes (Signed)
PT Cancellation Note  Patient Details Name: Kurt [name] MRN: [id number] DOB: 04-13-1941   Cancelled Treatment:    Reason Eval/Treat Not Completed: (Consult received and chart reviewed. Per attending physician Imogene Burn), CVA work-up negative at this time.  Patient now returned to baseline, and planning for return to [facility].  No need for PT evaluation prior to discharge.  Please notify PT should needs change.)  Hien Cunliffe H. Manson Passey, PT, DPT, NCS 03/01/18, 9:17 AM [phone]

## 2018-03-01 NOTE — Progress Notes (Signed)
SLP Cancellation Note  Patient Details Name: Kurt [name] MRN: [id number] DOB: Oct 12, 1940   Cancelled treatment:       Reason Eval/Treat Not Completed: SLP screened, no needs identified, will sign off Reviewed chart, interviewed patient and nursing. Speech/language/cognition screened. No apparent evidence of aphasia or cognitive linguistic deficits. Spontaneous speech is 100% intelligble and fluent. Mild imprecision of articulatory contacts noted; however pt reports no new difficulties, likely due to prior RCVA. Pt appears to be at baseline. L facial droop and slurred speech appear to have resolved. Pt denies any s/s of dysphagia. No current SLP needs identified, recommend notify SLP of any future change in status that warrants re-evaluation.   Mylea Roarty, MA, CCC-SLP 03/01/2018, 8:49 AM

## 2018-03-02 LAB — CULTURE, BLOOD (ROUTINE X 2)
Culture: NO GROWTH
Culture: NO GROWTH
Special Requests: ADEQUATE

## 2018-03-05 ENCOUNTER — Other Ambulatory Visit: Payer: Self-pay | Admitting: Adult Health

## 2018-03-06 ENCOUNTER — Non-Acute Institutional Stay (SKILLED_NURSING_FACILITY): Payer: Medicare Other | Admitting: Adult Health

## 2018-03-06 ENCOUNTER — Encounter: Payer: Self-pay | Admitting: Adult Health

## 2018-03-06 ENCOUNTER — Other Ambulatory Visit: Payer: Self-pay | Admitting: Adult Health

## 2018-03-06 DIAGNOSIS — I70213 Atherosclerosis of native arteries of extremities with intermittent claudication, bilateral legs: Secondary | ICD-10-CM

## 2018-03-06 DIAGNOSIS — I714 Abdominal aortic aneurysm, without rupture, unspecified: Secondary | ICD-10-CM

## 2018-03-06 DIAGNOSIS — R2689 Other abnormalities of gait and mobility: Secondary | ICD-10-CM

## 2018-03-06 MED ORDER — CLOPIDOGREL BISULFATE 75 MG PO TABS: 75.0000 mg | ORAL_TABLET | Freq: Every day | ORAL | 0 refills | Status: AC

## 2018-03-06 MED ORDER — METOPROLOL SUCCINATE ER 25 MG PO TB24: 25.0000 mg | ORAL_TABLET | Freq: Every day | ORAL | 0 refills | Status: DC

## 2018-03-06 MED ORDER — APIXABAN 5 MG PO TABS: 5.0000 mg | ORAL_TABLET | Freq: Two times a day (BID) | ORAL | 0 refills | Status: AC

## 2018-03-06 MED ORDER — GABAPENTIN 300 MG PO CAPS: 300.0000 mg | ORAL_CAPSULE | Freq: Every day | ORAL | 0 refills | Status: DC | PRN

## 2018-03-06 MED ORDER — ATORVASTATIN CALCIUM 40 MG PO TABS: 40.0000 mg | ORAL_TABLET | Freq: Every day | ORAL | 0 refills | Status: DC

## 2018-03-06 MED ORDER — CEPHALEXIN 500 MG PO CAPS: 500.0000 mg | ORAL_CAPSULE | Freq: Four times a day (QID) | ORAL | 0 refills | Status: AC

## 2018-03-06 MED ORDER — METFORMIN HCL ER 500 MG PO TB24: 500.0000 mg | ORAL_TABLET | Freq: Every day | ORAL | 0 refills | Status: DC

## 2018-03-06 MED ORDER — DONEPEZIL HCL 5 MG PO TABS: 5.0000 mg | ORAL_TABLET | Freq: Every day | ORAL | 0 refills | Status: AC

## 2018-03-06 MED ORDER — TRAMADOL HCL 50 MG PO TABS: 50.0000 mg | ORAL_TABLET | Freq: Four times a day (QID) | ORAL | 0 refills | Status: DC | PRN

## 2018-03-06 MED ORDER — GLIMEPIRIDE 2 MG PO TABS: 2.0000 mg | ORAL_TABLET | Freq: Every day | ORAL | 0 refills | Status: DC

## 2018-03-06 NOTE — Progress Notes (Signed)
Location:   [facility] Room Number: 204 A Place of Service:  SNF (31)    CODE STATUS: Full Code  No Known Allergies  Chief Complaint  Patient presents with  . Discharge Note    Discharging to home on 03/09/18    HPI:  He is being discharged to home with home health for pt/ot/rn/sw. He will need a standard wheelchair. He will need his prescriptions written and will need to follow up with his medical provider.  He is not ready to go home. He is unable to sit on the edge of his bed without asisstance due to poor balance; he is unable to ambulate to the bathroom significant balance issues. His home is roach infested; as he has brought roaches into the facility. He states that he will get the help he needs once he is home. He has been offered alternative placement for assisted living or SNF. He needs to access his VA benefits. His sister and brother are aware of his situation. This is an AMA discharge.  The facility will be notifying [facility]PS upon his discharge.      Past Medical History:  Diagnosis Date  . AAA (abdominal aortic aneurysm) (HCC)   . Diabetes mellitus without complication [facility])     Past Surgical History:  Procedure Laterality Date  . ABDOMINAL AORTIC ANEURYSM REPAIR    . arm surgery Left   . EMBOLECTOMY Right 02/17/2018   Procedure: FOGERTY EMBOLECTOMY;  Surgeon: Annice Needy, MD;  Location: [facility] ORS;  Service: Vascular;  Laterality: Right;  . FEMORAL-FEMORAL BYPASS GRAFT Right 02/17/2018   Procedure: BYPASS FEMORAL-FEMORAL ARTERY;  Surgeon: Annice Needy, MD;  Location: [facility] ORS;  Service: Vascular;  Laterality: Right;  . THROMBECTOMY FEMORAL ARTERY Right 02/17/2018   Procedure: THROMBECTOMY FEMORAL ARTERY;  Surgeon: Annice Needy, MD;  Location: [facility] ORS;  Service: Vascular;  Laterality: Right;    Social History   Socioeconomic History  . Marital status: Married    Spouse name: Not on file  . Number of children: Not on file  . Years of  education: Not on file  . Highest education level: Not on file  Occupational History  . Not on file  Social Needs  . Financial resource strain: Not on file  . Food insecurity:    Worry: Not on file    Inability: Not on file  . Transportation needs:    Medical: Not on file    Non-medical: Not on file  Tobacco Use  . Smoking status: Heavy Tobacco Smoker  . Smokeless tobacco: Current User  Substance and Sexual Activity  . Alcohol use: Not Currently    Frequency: Never  . Drug use: Never  . Sexual activity: Not on file  Lifestyle  . Physical activity:    Days per week: Not on file    Minutes per session: Not on file  . Stress: Not on file  Relationships  . Social connections:    Talks on phone: Not on file    Gets together: Not on file    Attends religious service: Not on file    Active member of club or organization: Not on file    Attends meetings of clubs or organizations: Not on file    Relationship status: Not on file  . Intimate partner violence:    Fear of current or ex partner: Not on file    Emotionally abused: Not on file    Physically abused: Not on file  Forced sexual activity: Not on file  Other Topics Concern  . Not on file  Social History Narrative  . Not on file   Family History  Problem Relation Age of Onset  . Alzheimer's disease Mother   . Heart disease Brother     VITAL SIGNS BP 131/63   Pulse 74   Temp 98.5 F (36.9 C)   Resp 20   Ht 6\' 2"  (1.88 m)   Wt 147 lb 12.8 oz (67 kg)   SpO2 99%   BMI 18.98 kg/m   Patient's Medications  New Prescriptions   No medications on file  Previous Medications   APIXABAN (ELIQUIS) 5 MG TABS TABLET    Take 1 tablet (5 mg total) by mouth 2 (two) times daily.   ATORVASTATIN (LIPITOR) 40 MG TABLET    Take 1 tablet (40 mg total) by mouth daily at 6 PM.   CEPHALEXIN (KEFLEX) 500 MG CAPSULE    Take 1 capsule (500 mg total) by mouth 4 (four) times daily for 10 days.   CHOLECALCIFEROL (D-5000) 5000 UNITS  TABS    Take 5,000 Units by mouth daily.    CLOPIDOGREL (PLAVIX) 75 MG TABLET    Take 75 mg by mouth daily.    CO-ENZYME Q-10 30 MG CAPSULE    Take 30 mg by mouth 3 (three) times daily.   COCONUT OIL 1000 MG CAPS    Take 1,000 mg by mouth as needed (dry skin).    DOCUSATE SODIUM (COLACE) 100 MG CAPSULE    Take 100 mg by mouth 2 (two) times daily.   DONEPEZIL (ARICEPT) 5 MG TABLET    Take 5 mg by mouth at bedtime.    GABAPENTIN (NEURONTIN) 300 MG CAPSULE    Take 300 mg by mouth daily as needed (Pain).    GARLIC OIL 1000 MG CAPS    Take 1,000 mg by mouth daily as needed (cholesterol).    GLIMEPIRIDE (AMARYL) 2 MG TABLET    Take 2 mg by mouth daily with breakfast.    METFORMIN (GLUCOPHAGE-XR) 500 MG 24 HR TABLET    Take 500 mg by mouth daily with breakfast.   METOPROLOL SUCCINATE (TOPROL-XL) 25 MG 24 HR TABLET    Take 25 mg by mouth daily.   MISC NATURAL PRODUCTS (PROSTATE THERAPY COMPLEX PO)    Take 562 mg by mouth daily.    NICOTINE (NICODERM CQ - DOSED IN MG/24 HOURS) 21 MG/24HR PATCH    Place 1 patch (21 mg total) onto the skin daily.   NON FORMULARY    Diet Type: NAS   RED YEAST RICE 600 MG CAPS    Take 1 capsule by mouth daily.    TRAMADOL (ULTRAM) 50 MG TABLET    Take 1 tablet (50 mg total) by mouth every 6 (six) hours as needed for moderate pain.   VITAMIN B-12 (CYANOCOBALAMIN) 1000 MCG TABLET    Take 1,000 mcg by mouth daily.   Modified Medications   No medications on file  Discontinued Medications   No medications on file     SIGNIFICANT DIAGNOSTIC EXAMS  PREVIOUS:   02-21-18: MRI of brain:  1. 9 mm focus of subtle diffusion abnormality within the right cerebellar hemisphere, most likely a small subacute ischemic infarct. No associated hemorrhage or mass effect. Finding is felt to most likely be incidental in nature. 2. No other acute intracranial abnormality. 3. Chronic hemorrhagic right PCA territory infarct. 4. Moderate cerebral atrophy with chronic small vessel ischemic  disease, mildly progressed relative to 2016. 5. Diffuse ventricular prominence most likely related to global parenchymal atrophy, similar to previous.  02-21-18: chest x-ray Decreased lung volumes with minimal basilar atelectasis  NO NEW LABS.   LABS REVIEWED PREVIOUS   02-25-18: wbc 17.2; hgb 12.9; hct 38.2; mcv 97.2; plt 313; glucose 267; bun 18; creat 1.02; k+ 4.7; na++ 135; ca 9.4; liver normal albumin 3.8 blood culture: no growth  02-25-18: (repeat) wbc 16.1; hgb 11.4; hct 33.0; mcv 95.4 plt 283   NO NEW LABS.     Review of Systems  Constitutional: Negative for malaise/fatigue.  Respiratory: Negative for cough and shortness of breath.   Cardiovascular: Negative for chest pain, palpitations and leg swelling.  Gastrointestinal: Negative for abdominal pain, constipation and heartburn.  Musculoskeletal: Negative for back pain, joint pain and myalgias.  Skin: Negative.   Neurological: Negative for dizziness.  Psychiatric/Behavioral: The patient is not nervous/anxious.     Physical Exam  Constitutional: He is oriented to person, place, and time. He appears well-developed and well-nourished. No distress.  Neck: No thyromegaly present.  Cardiovascular: Normal rate, regular rhythm, normal heart sounds and intact distal pulses.  Pulmonary/Chest: Effort normal and breath sounds normal. No respiratory distress.  Abdominal: Soft. Bowel sounds are normal. He exhibits no distension. There is no tenderness.  Musculoskeletal: He exhibits no edema.  He is able to move all extremities Using wheelchair due to poor balance.   Lymphadenopathy:    He has no cervical adenopathy.  Neurological: He is alert and oriented to person, place, and time.  Skin: Skin is warm and dry. He is not diaphoretic.  Incision line without signs of infection present.   Psychiatric: He has a normal mood and affect.     ASSESSMENT/ PLAN:  Patient is being discharged with the following home health services:   Pt/ot/rn/sw: to evaluate and treat as indicated for gait balance strength adl training medication management and community resources.   Patient is being discharged with the following durable medical equipment:  standard wheelchair with leg rest; cushion; anti-tippers; brake extension. To allow him to maintain his current level of independence of his adls such as toileting which cannot be achieved with a walker; due to his significant poor balance. He is able to propel himself.   Patient has been advised to f/u with their PCP in 1-2 weeks to bring them up to date on their rehab stay.  Social services at facility was responsible for arranging this appointment.  Pt was provided with a 30 day supply of prescriptions for medications and refills must be obtained from their PCP.  For controlled substances, a more limited supply may be provided adequate until PCP appointment only.  [facility] 30 [facility] [place] [id number] 50 mg tabs  Time spent with patient 45 minutes: discussed his housing; support people; home health needs placement needs and medications verbalized understanding.     Synthia Innocenteborah Leonila Speranza NP [facility]  Contact [phone] Monday through Friday 8am- 5pm  After hours call [phone]

## 2018-03-27 ENCOUNTER — Ambulatory Visit (INDEPENDENT_AMBULATORY_CARE_PROVIDER_SITE_OTHER): Payer: Medicare Other | Admitting: Vascular Surgery

## 2018-03-27 ENCOUNTER — Encounter (INDEPENDENT_AMBULATORY_CARE_PROVIDER_SITE_OTHER): Payer: Self-pay | Admitting: Vascular Surgery

## 2018-03-27 VITALS — BP 144/85 | HR 68 | Resp 17 | Ht 75.0 in | Wt 146.0 lb

## 2018-03-27 DIAGNOSIS — E119 Type 2 diabetes mellitus without complications: Secondary | ICD-10-CM

## 2018-03-27 DIAGNOSIS — I70219 Atherosclerosis of native arteries of extremities with intermittent claudication, unspecified extremity: Secondary | ICD-10-CM

## 2018-03-27 DIAGNOSIS — I714 Abdominal aortic aneurysm, without rupture, unspecified: Secondary | ICD-10-CM

## 2018-03-27 NOTE — Assessment & Plan Note (Signed)
blood glucose control important in reducing the progression of atherosclerotic disease. Also, involved in wound healing. On appropriate medications.  

## 2018-03-27 NOTE — Progress Notes (Signed)
Patient ID: Kurt [name], male   DOB: 1940/09/19, 77 y.o.   MRN: [id number]  Chief Complaint  Patient presents with  . Follow-up    Hospital f/u    HPI Kurt [name] is a 77 y.o. male.  Patient returns a little over a month after right femoral endarterectomy and embolectomy with revascularization for an ischemic right leg.  His femoral to femoral bypass was acutely occluded and was opened with surgery.  His leg has been doing well since that time.  His functional status continues to decline according to his girlfriend who cares for him.  She does say that his leg is much better though.  His incision is well-healed.  He has been following up with his neurologist for his cognitive decline.  He had a prolonged hospitalization due to cardiac arrhythmias but apparently that is stable now.   Past Medical History:  Diagnosis Date  . AAA (abdominal aortic aneurysm) (HCC)   . Diabetes mellitus without complication [facility])     Past Surgical History:  Procedure Laterality Date  . ABDOMINAL AORTIC ANEURYSM REPAIR    . arm surgery Left   . EMBOLECTOMY Right 02/17/2018   Procedure: FOGERTY EMBOLECTOMY;  Surgeon: Annice Needy, MD;  Location: [facility] ORS;  Service: Vascular;  Laterality: Right;  . FEMORAL-FEMORAL BYPASS GRAFT Right 02/17/2018   Procedure: BYPASS FEMORAL-FEMORAL ARTERY;  Surgeon: Annice Needy, MD;  Location: [facility] ORS;  Service: Vascular;  Laterality: Right;  . THROMBECTOMY FEMORAL ARTERY Right 02/17/2018   Procedure: THROMBECTOMY FEMORAL ARTERY;  Surgeon: Annice Needy, MD;  Location: [facility] ORS;  Service: Vascular;  Laterality: Right;      No Known Allergies  Current Outpatient Medications  Medication Sig Dispense Refill  . apixaban (ELIQUIS) 5 MG TABS tablet Take 1 tablet (5 mg total) by mouth 2 (two) times daily. 60 tablet 0  . Cholecalciferol (D-5000) 5000 units TABS Take 5,000 Units by mouth daily.     . clopidogrel (PLAVIX) 75 MG tablet Take 1 tablet (75 mg total) by mouth  daily. 30 tablet 0  . co-enzyme Q-10 30 MG capsule Take 30 mg by mouth 3 (three) times daily.    Marland Kitchen docusate sodium (COLACE) 100 MG capsule Take 100 mg by mouth 2 (two) times daily.    Marland Kitchen donepezil (ARICEPT) 5 MG tablet Take 1 tablet (5 mg total) by mouth at bedtime. 30 tablet 0  . Garlic Oil 1000 MG CAPS Take 1,000 mg by mouth daily as needed (cholesterol).     Marland Kitchen glimepiride (AMARYL) 2 MG tablet Take 1 tablet (2 mg total) by mouth daily with breakfast. 60 tablet 0  . metFORMIN (GLUCOPHAGE-XR) 500 MG 24 hr tablet Take 1 tablet (500 mg total) by mouth daily with breakfast. (Patient taking differently: Take 500 mg by mouth daily with breakfast. ) 30 tablet 0  . metoprolol succinate (TOPROL-XL) 25 MG 24 hr tablet Take 1 tablet (25 mg total) by mouth daily. 30 tablet 0  . Red Yeast Rice 600 MG CAPS Take 1 capsule by mouth daily.     . vitamin B-12 (CYANOCOBALAMIN) 1000 MCG tablet Take 1,000 mcg by mouth daily.     . Coconut Oil 1000 MG CAPS Take 1,000 mg by mouth as needed (dry skin).     Marland Kitchen gabapentin (NEURONTIN) 300 MG capsule Take 1 capsule (300 mg total) by mouth daily as needed (Pain). (Patient not taking: Reported on 03/27/2018) 30 capsule 0  . Misc Natural Products (PROSTATE THERAPY  COMPLEX PO) Take 562 mg by mouth daily.     . nicotine (NICODERM CQ - DOSED IN MG/24 HOURS) 21 mg/24hr patch Place 1 patch (21 mg total) onto the skin daily. (Patient not taking: Reported on 03/27/2018) 28 patch 0  . NON FORMULARY Diet Type: NAS    . traMADol (ULTRAM) 50 MG tablet Take 1 tablet (50 mg total) by mouth every 6 (six) hours as needed for moderate pain. (Patient not taking: Reported on 03/27/2018) 15 tablet 0   No current facility-administered medications for this visit.         Physical Exam BP (!) 144/85 (BP Location: Left Arm, Patient Position: Sitting)   Pulse 68   Resp 17   Ht 6\' 3"  (1.905 m)   Wt 146 lb (66.2 kg)   BMI 18.25 kg/m  Gen:  WD/WN, NAD Skin: incision  C/D/I     Assessment/Plan:  Abdominal aortic aneurysm without rupture (HCC) No endoleak with sac shrinkage at last check.  Scheduled for noninvasive studies next year.  Diabetes mellitus type 2, uncomplicated (HCC) blood glucose control important in reducing the progression of atherosclerotic disease. Also, involved in wound healing. On appropriate medications.   Atherosclerotic PVD with intermittent claudication (HCC) Perfusion is significantly improved after recent surgery.  I would plan to recheck his perfusion in about 3 months with noninvasive studies.  Contact our office with any problems in the interim.      Festus BarrenJason Meaghen Vecchiarelli 03/27/2018, 12:08 PM   This note was created with Dragon medical transcription system.  Any errors from dictation are unintentional.

## 2018-03-27 NOTE — Assessment & Plan Note (Signed)
No endoleak with sac shrinkage at last check.  Scheduled for noninvasive studies next year.

## 2018-03-27 NOTE — Assessment & Plan Note (Signed)
Perfusion is significantly improved after recent surgery.  I would plan to recheck his perfusion in about 3 months with noninvasive studies.  Contact our office with any problems in the interim.

## 2018-04-10 ENCOUNTER — Other Ambulatory Visit: Payer: Self-pay | Admitting: Adult Health

## 2018-05-01 ENCOUNTER — Encounter

## 2018-05-01 ENCOUNTER — Encounter (INDEPENDENT_AMBULATORY_CARE_PROVIDER_SITE_OTHER): Payer: Self-pay | Admitting: Vascular Surgery

## 2018-05-01 ENCOUNTER — Ambulatory Visit (INDEPENDENT_AMBULATORY_CARE_PROVIDER_SITE_OTHER): Payer: Medicare Other | Admitting: Vascular Surgery

## 2018-05-01 ENCOUNTER — Ambulatory Visit (INDEPENDENT_AMBULATORY_CARE_PROVIDER_SITE_OTHER): Payer: Medicare Other

## 2018-05-01 VITALS — BP 154/80 | HR 73 | Resp 14 | Ht 74.0 in | Wt 148.2 lb

## 2018-05-01 DIAGNOSIS — F17200 Nicotine dependence, unspecified, uncomplicated: Secondary | ICD-10-CM

## 2018-05-01 DIAGNOSIS — I70219 Atherosclerosis of native arteries of extremities with intermittent claudication, unspecified extremity: Secondary | ICD-10-CM | POA: Diagnosis not present

## 2018-05-01 DIAGNOSIS — I70213 Atherosclerosis of native arteries of extremities with intermittent claudication, bilateral legs: Secondary | ICD-10-CM

## 2018-05-01 DIAGNOSIS — I714 Abdominal aortic aneurysm, without rupture, unspecified: Secondary | ICD-10-CM

## 2018-05-01 DIAGNOSIS — E1151 Type 2 diabetes mellitus with diabetic peripheral angiopathy without gangrene: Secondary | ICD-10-CM

## 2018-05-01 NOTE — Progress Notes (Signed)
Patient ID: Kurt [name], male   DOB: Oct 04, 1940, 78 y.o.   MRN: [id number]  Chief Complaint  Patient presents with  . Follow-up    HPI Kurt [name] is a 78 y.o. male.  Patient returns prior to scheduled follow-up visit for some numbness and tingling in toes on the right foot.  He says this was mostly last week and has largely resolved.  He continues to smoke and understands that this is very deleterious to his vascular disease.  His ABIs today are actually better than his last visit and are at 0.61 on the right and 0.75 on the left.  He has normal digital pressures and waveforms on the right and a slightly reduced digital pressure of 96 but a good waveform on the left.  Overall, his perfusion is stable to improved.   Past Medical History:  Diagnosis Date  . AAA (abdominal aortic aneurysm) (HCC)   . Diabetes mellitus without complication [facility])     Past Surgical History:  Procedure Laterality Date  . ABDOMINAL AORTIC ANEURYSM REPAIR    . arm surgery Left   . EMBOLECTOMY Right 02/17/2018   Procedure: FOGERTY EMBOLECTOMY;  Surgeon: Annice Needy, MD;  Location: [facility] ORS;  Service: Vascular;  Laterality: Right;  . FEMORAL-FEMORAL BYPASS GRAFT Right 02/17/2018   Procedure: BYPASS FEMORAL-FEMORAL ARTERY;  Surgeon: Annice Needy, MD;  Location: [facility] ORS;  Service: Vascular;  Laterality: Right;  . THROMBECTOMY FEMORAL ARTERY Right 02/17/2018   Procedure: THROMBECTOMY FEMORAL ARTERY;  Surgeon: Annice Needy, MD;  Location: [facility] ORS;  Service: Vascular;  Laterality: Right;      No Known Allergies  Current Outpatient Medications  Medication Sig Dispense Refill  . apixaban (ELIQUIS) 5 MG TABS tablet Take 1 tablet (5 mg total) by mouth 2 (two) times daily. 60 tablet 0  . Cholecalciferol (D-5000) 5000 units TABS Take 5,000 Units by mouth daily.     . clopidogrel (PLAVIX) 75 MG tablet Take 1 tablet (75 mg total) by mouth daily. 30 tablet 0  . co-enzyme Q-10 30 MG capsule Take 30 mg by  mouth 3 (three) times daily.    Marland Kitchen docusate sodium (COLACE) 100 MG capsule Take 100 mg by mouth 2 (two) times daily.    Marland Kitchen donepezil (ARICEPT) 5 MG tablet Take 1 tablet (5 mg total) by mouth at bedtime. 30 tablet 0  . Garlic Oil 1000 MG CAPS Take 1,000 mg by mouth daily as needed (cholesterol).     Marland Kitchen glimepiride (AMARYL) 2 MG tablet Take 1 tablet (2 mg total) by mouth daily with breakfast. 60 tablet 0  . metFORMIN (GLUCOPHAGE-XR) 500 MG 24 hr tablet Take 1 tablet (500 mg total) by mouth daily with breakfast. (Patient taking differently: Take 500 mg by mouth daily with breakfast. ) 30 tablet 0  . metoprolol succinate (TOPROL-XL) 25 MG 24 hr tablet Take 1 tablet (25 mg total) by mouth daily. 30 tablet 0  . Misc Natural Products (PROSTATE THERAPY COMPLEX PO) Take 562 mg by mouth daily.     . Red Yeast Rice 600 MG CAPS Take 1 capsule by mouth daily.     . traMADol (ULTRAM) 50 MG tablet Take 1 tablet (50 mg total) by mouth every 6 (six) hours as needed for moderate pain. 15 tablet 0  . vitamin B-12 (CYANOCOBALAMIN) 1000 MCG tablet Take 1,000 mcg by mouth daily.      No current facility-administered medications for this visit.  Physical Exam BP (!) 154/80 (BP Location: Right Arm, Patient Position: Sitting)   Pulse 73   Resp 14   Ht 6\' 2"  (1.88 m)   Wt 148 lb 3.2 oz (67.2 kg)   BMI 19.03 kg/m  Gen:  WD/WN, NAD Skin: incision C/D/I.  Ext: Feet warm with good capillary refill and 1+ pulses.     Assessment/Plan: Abdominal aortic aneurysm without rupture (HCC) No endoleak with sac shrinkage at last check.  Scheduled for noninvasive studies next year.  Diabetes mellitus type 2, uncomplicated (HCC) blood glucose control important in reducing the progression of atherosclerotic disease. Also, involved in wound healing. On appropriate medications.    Atherosclerotic PVD with intermittent claudication (HCC) Perfusion is improved after recent surgery.  I would plan to recheck his  perfusion in about 3 months with noninvasive studies.  Contact our office with any problems in the interim.   Have recommended he stop smoking but that is highly unlikely.      Kurt [name] 05/01/2018, 11:28 AM   This note was created with Dragon medical transcription system.  Any errors from dictation are unintentional.

## 2018-06-12 ENCOUNTER — Other Ambulatory Visit: Payer: Self-pay | Admitting: Adult Health

## 2018-06-29 ENCOUNTER — Ambulatory Visit (INDEPENDENT_AMBULATORY_CARE_PROVIDER_SITE_OTHER): Payer: Medicare Other | Admitting: Vascular Surgery

## 2018-06-29 ENCOUNTER — Encounter (INDEPENDENT_AMBULATORY_CARE_PROVIDER_SITE_OTHER): Payer: Medicare Other

## 2018-06-29 ENCOUNTER — Encounter (INDEPENDENT_AMBULATORY_CARE_PROVIDER_SITE_OTHER): Payer: Self-pay

## 2018-11-20 ENCOUNTER — Ambulatory Visit (INDEPENDENT_AMBULATORY_CARE_PROVIDER_SITE_OTHER): Payer: Medicare Other | Admitting: Vascular Surgery

## 2018-11-20 ENCOUNTER — Ambulatory Visit (INDEPENDENT_AMBULATORY_CARE_PROVIDER_SITE_OTHER): Payer: Medicare Other

## 2018-11-20 ENCOUNTER — Encounter (INDEPENDENT_AMBULATORY_CARE_PROVIDER_SITE_OTHER): Payer: Self-pay | Admitting: Vascular Surgery

## 2018-11-20 ENCOUNTER — Encounter (INDEPENDENT_AMBULATORY_CARE_PROVIDER_SITE_OTHER): Payer: Self-pay

## 2018-11-20 ENCOUNTER — Other Ambulatory Visit: Payer: Self-pay

## 2018-11-20 VITALS — BP 148/80 | HR 82 | Resp 14 | Ht 74.0 in | Wt 150.0 lb

## 2018-11-20 DIAGNOSIS — I714 Abdominal aortic aneurysm, without rupture, unspecified: Secondary | ICD-10-CM

## 2018-11-20 DIAGNOSIS — E1151 Type 2 diabetes mellitus with diabetic peripheral angiopathy without gangrene: Secondary | ICD-10-CM

## 2018-11-20 DIAGNOSIS — Z7984 Long term (current) use of oral hypoglycemic drugs: Secondary | ICD-10-CM

## 2018-11-20 DIAGNOSIS — F17200 Nicotine dependence, unspecified, uncomplicated: Secondary | ICD-10-CM | POA: Diagnosis not present

## 2018-11-20 DIAGNOSIS — I6523 Occlusion and stenosis of bilateral carotid arteries: Secondary | ICD-10-CM

## 2018-11-20 DIAGNOSIS — I70213 Atherosclerosis of native arteries of extremities with intermittent claudication, bilateral legs: Secondary | ICD-10-CM | POA: Diagnosis not present

## 2018-11-20 DIAGNOSIS — I70219 Atherosclerosis of native arteries of extremities with intermittent claudication, unspecified extremity: Secondary | ICD-10-CM | POA: Diagnosis not present

## 2018-11-20 DIAGNOSIS — I6529 Occlusion and stenosis of unspecified carotid artery: Secondary | ICD-10-CM

## 2018-11-20 DIAGNOSIS — Z7902 Long term (current) use of antithrombotics/antiplatelets: Secondary | ICD-10-CM

## 2018-11-20 DIAGNOSIS — Z79899 Other long term (current) drug therapy: Secondary | ICD-10-CM

## 2018-11-20 NOTE — Assessment & Plan Note (Signed)
Duplex today demonstrates carotid stenosis in the 40 to 59% range on the right although just into the range and in the 1 to 39% range on the left.  No focal neurologic symptoms.  Continue current medical regimen and recheck in 1 year

## 2018-11-20 NOTE — Assessment & Plan Note (Signed)
Duplex today shows a patent stent graft with no evidence of endoleak and only approximately 3.2 cm aneurysm sac around the stent graft.  This is doing well.  We will plan to continue annual duplex follow-up unless other problems develop in the interim

## 2018-11-20 NOTE — Assessment & Plan Note (Signed)
Long-term heavy tobacco use.  He understands this is been a major risk factor for both his atherosclerotic disease as well as his aneurysm.  We discussed the importance of tobacco cessation for several minutes but he really has no desire to quit.

## 2018-11-20 NOTE — Assessment & Plan Note (Signed)
His ABIs today were actually significantly better on the right at 0.91 and stable on the left at 0.77.  Although his waveforms are monophasic, they are fairly strong.  No limb threatening symptoms.  No role for any interventions at this time.  Recheck in 1 year

## 2018-11-20 NOTE — Patient Instructions (Signed)
Peripheral Vascular Disease  Peripheral vascular disease (PVD) is a disease of the blood vessels that are not part of your heart and brain. A simple term for PVD is poor circulation. In most cases, PVD narrows the blood vessels that carry blood from your heart to the rest of your body. This can reduce the supply of blood to your arms, legs, and internal organs, like your stomach or kidneys. However, PVD most often affects a person's lower legs and feet. Without treatment, PVD tends to get worse. PVD can also lead to acute ischemic limb. This is when an arm or leg suddenly cannot get enough blood. This is a medical emergency. Follow these instructions at home: Lifestyle  Do not use any products that contain nicotine or tobacco, such as cigarettes and e-cigarettes. If you need help quitting, ask your doctor.  Lose weight if you are overweight. Or, stay at a healthy weight as told by your doctor.  Eat a diet that is low in fat and cholesterol. If you need help, ask your doctor.  Exercise regularly. Ask your doctor for activities that are right for you. General instructions  Take over-the-counter and prescription medicines only as told by your doctor.  Take good care of your feet: ? Wear comfortable shoes that fit well. ? Check your feet often for any cuts or sores.  Keep all follow-up visits as told by your doctor This is important. Contact a doctor if:  You have cramps in your legs when you walk.  You have leg pain when you are at rest.  You have coldness in a leg or foot.  Your skin changes.  You are unable to get or have an erection (erectile dysfunction).  You have cuts or sores on your feet that do not heal. Get help right away if:  Your arm or leg turns cold, numb, and blue.  Your arms or legs become red, warm, swollen, painful, or numb.  You have chest pain.  You have trouble breathing.  You suddenly have weakness in your face, arm, or leg.  You become very  confused or you cannot speak.  You suddenly have a very bad headache.  You suddenly cannot see. Summary  Peripheral vascular disease (PVD) is a disease of the blood vessels.  A simple term for PVD is poor circulation. Without treatment, PVD tends to get worse.  Treatment may include exercise, low fat and low cholesterol diet, and quitting smoking. This information is not intended to replace advice given to you by your health care provider. Make sure you discuss any questions you have with your health care provider. Document Released: 07/06/2009 Document Revised: 03/24/2017 Document Reviewed: 05/19/2016 Elsevier Patient Education  2020 [employer]  

## 2018-11-20 NOTE — Assessment & Plan Note (Signed)
blood glucose control important in reducing the progression of atherosclerotic disease. Also, involved in wound healing. On appropriate medications.  

## 2018-11-20 NOTE — Progress Notes (Signed)
MRN : [id number]  Kurt [name] is a 78 y.o. (June 14, 1940) male who presents with chief complaint of  Chief Complaint  Patient presents with  . Follow-up    ultrasound  .  History of Present Illness: Patient returns today in follow up of multiple vascular issues.  He has known carotid disease we have followed for many years with no focal neurologic symptoms.  He does have memory issues and balance disturbances.  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  He had an ischemic limb last year that we were able to revascularize and since that time he has done reasonably well.  He does still have some claudication symptoms although his walking has dramatically decreased.  He does continue to smoke and basically his wife says the only walking he does is to go smoke.  No new ulcerations or infection.  No aneurysm related symptoms. Duplex today shows a patent stent graft with no evidence of endoleak and only approximately 3.2 cm aneurysm sac around the stent graft.  His ABIs today were actually significantly better on the right at 0.91 and stable on the left at 0.77.  Although his waveforms are monophasic, they are fairly strong.  He is also studied for his carotid disease today. Duplex today demonstrates carotid stenosis in the 40 to 59% range on the right although just into the range and in the 1 to 39% range on the left  Current Outpatient Medications  Medication Sig Dispense Refill  . apixaban (ELIQUIS) 5 MG TABS tablet Take 1 tablet (5 mg total) by mouth 2 (two) times daily. 60 tablet 0  . Cholecalciferol (D-5000) 5000 units TABS Take 5,000 Units by mouth daily.     . clopidogrel (PLAVIX) 75 MG tablet Take 1 tablet (75 mg total) by mouth daily. 30 tablet 0  . co-enzyme Q-10 30 MG capsule Take 30 mg by mouth 3 (three) times daily.    Marland Kitchen. docusate sodium (COLACE) 100 MG capsule Take 100 mg by  mouth 2 (two) times daily.    Marland Kitchen. donepezil (ARICEPT) 5 MG tablet Take 1 tablet (5 mg total) by mouth at bedtime. 30 tablet 0  . Garlic Oil 1000 MG CAPS Take 1,000 mg by mouth daily as needed (cholesterol).     Marland Kitchen. glimepiride (AMARYL) 2 MG tablet Take 1 tablet (2 mg total) by mouth daily with breakfast. 60 tablet 0  . metFORMIN (GLUCOPHAGE-XR) 500 MG 24 hr tablet Take 1 tablet (500 mg total) by mouth daily with breakfast. (Patient taking differently: Take 500 mg by mouth daily with breakfast. ) 30 tablet 0  . metoprolol succinate (TOPROL-XL) 25 MG 24 hr tablet Take 1 tablet (25 mg total) by mouth daily. 30 tablet 0  . Misc Natural Products (PROSTATE THERAPY COMPLEX PO) Take 562 mg by mouth daily.     . Red Yeast Rice 600 MG CAPS Take 1 capsule by mouth daily.     . vitamin B-12 (CYANOCOBALAMIN) 1000 MCG tablet Take 1,000 mcg by mouth daily.     . traMADol (ULTRAM) 50 MG tablet Take 1 tablet (50 mg total) by mouth every 6 (six) hours as needed for moderate pain. (Patient not taking: Reported on 11/20/2018) 15 tablet 0   No current facility-administered medications for this visit.     Past Medical History:  Diagnosis Date  . AAA (abdominal aortic aneurysm) (  HCC)   . Diabetes mellitus without complication [facility](HCC)     Past Surgical History:  Procedure Laterality Date  . ABDOMINAL AORTIC ANEURYSM REPAIR    . arm surgery Left   . EMBOLECTOMY Right 02/17/2018   Procedure: FOGERTY EMBOLECTOMY;  Surgeon: Annice Needyew,  S, MD;  Location: [facility] ORS;  Service: Vascular;  Laterality: Right;  . FEMORAL-FEMORAL BYPASS GRAFT Right 02/17/2018   Procedure: BYPASS FEMORAL-FEMORAL ARTERY;  Surgeon: Annice Needyew,  S, MD;  Location: [facility] ORS;  Service: Vascular;  Laterality: Right;  . THROMBECTOMY FEMORAL ARTERY Right 02/17/2018   Procedure: THROMBECTOMY FEMORAL ARTERY;  Surgeon: Annice Needyew,  S, MD;  Location: [facility] ORS;  Service: Vascular;  Laterality: Right;    Social History Social History   Tobacco Use  . Smoking  status: Heavy Tobacco Smoker  . Smokeless tobacco: Current User  Substance Use Topics  . Alcohol use: Not Currently    Frequency: Never  . Drug use: Never    Family History Family History  Problem Relation Age of Onset  . Alzheimer's disease Mother   . Heart disease Brother   no bleeding or clotting disorders  Allergies  Allergen Reactions  . Lipitor [Atorvastatin Calcium] Rash     REVIEW OF SYSTEMS (Negative unless checked)  Constitutional: [] Weight loss  [] Fever  [] Chills Cardiac: [] Chest pain   [] Chest pressure   [] Palpitations   [] Shortness of breath when laying flat   [] Shortness of breath at rest   [x] Shortness of breath with exertion. Vascular:  [] Pain in legs with walking   [] Pain in legs at rest   [] Pain in legs when laying flat   [] Claudication   [] Pain in feet when walking  [] Pain in feet at rest  [] Pain in feet when laying flat   [] History of DVT   [] Phlebitis   [] Swelling in legs   [] Varicose veins   [] Non-healing ulcers Pulmonary:   [] Uses home oxygen   [] Productive cough   [] Hemoptysis   [] Wheeze  [] COPD   [] Asthma Neurologic:  [] Dizziness  [] Blackouts   [] Seizures   [] History of stroke   [] History of TIA  [] Aphasia   [] Temporary blindness   [] Dysphagia   [] Weakness or numbness in arms   [] Weakness or numbness in legs X positive for memory issues Musculoskeletal:  [x] Arthritis   [] Joint swelling   [x] Joint pain   [] Low back pain Hematologic:  [] Easy bruising  [] Easy bleeding   [] Hypercoagulable state   [] Anemic   Gastrointestinal:  [] Blood in stool   [] Vomiting blood  [x] Gastroesophageal reflux/heartburn   [] Abdominal pain Genitourinary:  [] Chronic kidney disease   [] Difficult urination  [] Frequent urination  [] Burning with urination   [] Hematuria Skin:  [] Rashes   [] Ulcers   [] Wounds Psychological:  [] History of anxiety   []  History of major depression.  Physical Examination  BP (!) 148/80 (BP Location: Right Arm)   Pulse 82   Resp 14   Ht 6\' 2"  (1.88 m)   Wt  150 lb (68 kg)   BMI 19.26 kg/m  Gen:  WD/WN, NAD Head: [place]/AT, No temporalis wasting. Ear/Nose/Throat: Hearing grossly intact, nares w/o erythema or drainage Eyes: Conjunctiva clear. Sclera non-icteric Neck: Supple.  Trachea midline.  No carotid bruits Pulmonary:  Good air movement, no use of accessory muscles.  Cardiac: RRR, no JVD Vascular:  Vessel Right Left  Radial Palpable Palpable                          PT  1+  palpable  1+ palpable  DP  1+ palpable  1+ palpable   Gastrointestinal: soft, non-tender/non-distended. No increased aortic impulse Musculoskeletal: M/S 5/5 throughout.  No deformity or atrophy.  No edema. Neurologic: Sensation grossly intact in extremities.  Symmetrical.  Speech is fluent.  Psychiatric: Judgment intact, Mood & affect appropriate for pt's clinical situation. Dermatologic: No rashes or ulcers noted.  No cellulitis or open wounds.       Labs No results found for this or any previous visit (from the past 2160 hour(s)).  Radiology No results found.  Assessment/Plan  Diabetes (HCC) blood glucose control important in reducing the progression of atherosclerotic disease. Also, involved in wound healing. On appropriate medications.   Abdominal aortic aneurysm without rupture (HCC) Duplex today shows a patent stent graft with no evidence of endoleak and only approximately 3.2 cm aneurysm sac around the stent graft.  This is doing well.  We will plan to continue annual duplex follow-up unless other problems develop in the interim  Atherosclerotic PVD with intermittent claudication ([place]) His ABIs today were actually significantly better on the right at 0.91 and stable on the left at 0.77.  Although his waveforms are monophasic, they are fairly strong.  No limb threatening symptoms.  No role for any interventions at this time.  Recheck in 1 year  Carotid stenosis Duplex today demonstrates carotid stenosis in the 40 to 59% range on the right  although just into the range and in the 1 to 39% range on the left.  No focal neurologic symptoms.  Continue current medical regimen and recheck in 1 year  Tobacco dependence Long-term heavy tobacco use.  He understands this is been a major risk factor for both his atherosclerotic disease as well as his aneurysm.  We discussed the importance of tobacco cessation for several minutes but he really has no desire to quit.    Leotis Pain, MD  11/20/2018 12:20 PM    This note was created with Dragon medical transcription system.  Any errors from dictation are purely unintentional

## 2019-04-05 ENCOUNTER — Inpatient Hospital Stay
Admission: EM | Admit: 2019-04-05 | Discharge: 2019-04-12 | DRG: 280 | Disposition: A | Discharge status: Other Acute Inpatient Hospital | Payer: Medicare Other | Attending: Internal Medicine | Admitting: Internal Medicine

## 2019-04-05 ENCOUNTER — Emergency Department: Payer: Medicare Other

## 2019-04-05 ENCOUNTER — Other Ambulatory Visit: Payer: Self-pay

## 2019-04-05 DIAGNOSIS — I959 Hypotension, unspecified: Secondary | ICD-10-CM | POA: Diagnosis present

## 2019-04-05 DIAGNOSIS — I441 Atrioventricular block, second degree: Secondary | ICD-10-CM | POA: Diagnosis present

## 2019-04-05 DIAGNOSIS — I4891 Unspecified atrial fibrillation: Secondary | ICD-10-CM

## 2019-04-05 DIAGNOSIS — I2510 Atherosclerotic heart disease of native coronary artery without angina pectoris: Secondary | ICD-10-CM | POA: Diagnosis present

## 2019-04-05 DIAGNOSIS — I214 Non-ST elevation (NSTEMI) myocardial infarction: Secondary | ICD-10-CM

## 2019-04-05 DIAGNOSIS — I482 Chronic atrial fibrillation, unspecified: Secondary | ICD-10-CM

## 2019-04-05 DIAGNOSIS — L89152 Pressure ulcer of sacral region, stage 2: Secondary | ICD-10-CM

## 2019-04-05 DIAGNOSIS — E1165 Type 2 diabetes mellitus with hyperglycemia: Secondary | ICD-10-CM

## 2019-04-05 DIAGNOSIS — I472 Ventricular tachycardia: Secondary | ICD-10-CM | POA: Diagnosis not present

## 2019-04-05 DIAGNOSIS — R000 Tachycardia, unspecified: Secondary | ICD-10-CM

## 2019-04-05 DIAGNOSIS — E1151 Type 2 diabetes mellitus with diabetic peripheral angiopathy without gangrene: Secondary | ICD-10-CM | POA: Diagnosis present

## 2019-04-05 DIAGNOSIS — Z79899 Other long term (current) drug therapy: Secondary | ICD-10-CM

## 2019-04-05 DIAGNOSIS — I739 Peripheral vascular disease, unspecified: Secondary | ICD-10-CM

## 2019-04-05 DIAGNOSIS — G9341 Metabolic encephalopathy: Secondary | ICD-10-CM

## 2019-04-05 DIAGNOSIS — I4892 Unspecified atrial flutter: Secondary | ICD-10-CM | POA: Diagnosis present

## 2019-04-05 DIAGNOSIS — Z7984 Long term (current) use of oral hypoglycemic drugs: Secondary | ICD-10-CM

## 2019-04-05 DIAGNOSIS — Z7901 Long term (current) use of anticoagulants: Secondary | ICD-10-CM

## 2019-04-05 DIAGNOSIS — Z7902 Long term (current) use of antithrombotics/antiplatelets: Secondary | ICD-10-CM

## 2019-04-05 DIAGNOSIS — I714 Abdominal aortic aneurysm, without rupture, unspecified: Secondary | ICD-10-CM | POA: Diagnosis present

## 2019-04-05 DIAGNOSIS — Z20828 Contact with and (suspected) exposure to other viral communicable diseases: Secondary | ICD-10-CM | POA: Diagnosis present

## 2019-04-05 DIAGNOSIS — Z8249 Family history of ischemic heart disease and other diseases of the circulatory system: Secondary | ICD-10-CM

## 2019-04-05 DIAGNOSIS — F17200 Nicotine dependence, unspecified, uncomplicated: Secondary | ICD-10-CM | POA: Diagnosis present

## 2019-04-05 DIAGNOSIS — I4510 Unspecified right bundle-branch block: Secondary | ICD-10-CM

## 2019-04-05 DIAGNOSIS — Z8673 Personal history of transient ischemic attack (TIA), and cerebral infarction without residual deficits: Secondary | ICD-10-CM

## 2019-04-05 DIAGNOSIS — I255 Ischemic cardiomyopathy: Secondary | ICD-10-CM | POA: Diagnosis present

## 2019-04-05 DIAGNOSIS — I5033 Acute on chronic diastolic (congestive) heart failure: Secondary | ICD-10-CM

## 2019-04-05 LAB — COMPREHENSIVE METABOLIC PANEL
ALT: 61 U/L — ABNORMAL HIGH (ref 0–44)
AST: 87 U/L — ABNORMAL HIGH (ref 15–41)
Albumin: 3.7 g/dL (ref 3.5–5.0)
Alkaline Phosphatase: 124 U/L (ref 38–126)
Anion gap: 9 (ref 5–15)
BUN: 21 mg/dL (ref 8–23)
CO2: 24 mmol/L (ref 22–32)
Calcium: 8.9 mg/dL (ref 8.9–10.3)
Chloride: 103 mmol/L (ref 98–111)
Creatinine, Ser: 1.37 mg/dL — ABNORMAL HIGH (ref 0.61–1.24)
GFR calc Af Amer: 58 mL/min — ABNORMAL LOW (ref >60)
GFR calc non Af Amer: 50 mL/min — ABNORMAL LOW (ref >60)
Glucose, Bld: 575 mg/dL (ref 70–99)
Potassium: 4.1 mmol/L (ref 3.5–5.1)
Sodium: 136 mmol/L (ref 135–145)
Total Bilirubin: 0.8 mg/dL (ref 0.3–1.2)
Total Protein: 6.7 g/dL (ref 6.5–8.1)

## 2019-04-05 LAB — CBC WITH DIFFERENTIAL/PLATELET
Abs Immature Granulocytes: 0.04 10*3/uL (ref 0.00–0.07)
Basophils Absolute: 0.1 10*3/uL (ref 0.0–0.1)
Basophils Relative: 1 %
Eosinophils Absolute: 0.2 10*3/uL (ref 0.0–0.5)
Eosinophils Relative: 2 %
HCT: 40.9 % (ref 39.0–52.0)
Hemoglobin: 14 g/dL (ref 13.0–17.0)
Immature Granulocytes: 1 %
Lymphocytes Relative: 16 %
Lymphs Abs: 1.3 10*3/uL (ref 0.7–4.0)
MCH: 33.3 pg (ref 26.0–34.0)
MCHC: 34.2 g/dL (ref 30.0–36.0)
MCV: 97.4 fL (ref 80.0–100.0)
Monocytes Absolute: 0.4 10*3/uL (ref 0.1–1.0)
Monocytes Relative: 5 %
Neutro Abs: 6.6 10*3/uL (ref 1.7–7.7)
Neutrophils Relative %: 75 %
Platelets: 144 10*3/uL — ABNORMAL LOW (ref 150–400)
RBC: 4.2 MIL/uL — ABNORMAL LOW (ref 4.22–5.81)
RDW: 13 % (ref 11.5–15.5)
WBC: 8.6 10*3/uL (ref 4.0–10.5)
nRBC: 0 % (ref 0.0–0.2)

## 2019-04-05 LAB — GLUCOSE, CAPILLARY: Glucose-Capillary: 418 mg/dL — ABNORMAL HIGH (ref 70–99)

## 2019-04-05 LAB — BRAIN NATRIURETIC PEPTIDE: B Natriuretic Peptide: 195 pg/mL — ABNORMAL HIGH (ref 0.0–100.0)

## 2019-04-05 LAB — TROPONIN I (HIGH SENSITIVITY)
Troponin I (High Sensitivity): 92 ng/L — ABNORMAL HIGH (ref <18)
Troponin I (High Sensitivity): 1552 ng/L (ref <18)

## 2019-04-05 MED ORDER — DILTIAZEM HCL 100 MG IV SOLR
INTRAVENOUS | Status: AC
  Administered 2019-04-05: 5 mg/h via INTRAVENOUS
  Filled 2019-04-05: qty 100

## 2019-04-05 MED ORDER — AMIODARONE HCL IN DEXTROSE 360-4.14 MG/200ML-% IV SOLN
30.0000 mg/h | INTRAVENOUS | Status: DC
  Administered 2019-04-06: 30 mg/h via INTRAVENOUS
  Filled 2019-04-05: qty 200

## 2019-04-05 MED ORDER — DILTIAZEM HCL 100 MG IV SOLR
5.0000 mg/h | INTRAVENOUS | Status: DC
  Administered 2019-04-05: 5 mg/h via INTRAVENOUS

## 2019-04-05 MED ORDER — DILTIAZEM HCL 25 MG/5ML IV SOLN
INTRAVENOUS | Status: AC
  Filled 2019-04-05: qty 5

## 2019-04-05 MED ORDER — DILTIAZEM HCL 25 MG/5ML IV SOLN
15.0000 mg | Freq: Once | INTRAVENOUS | Status: AC
  Administered 2019-04-05: 15 mg via INTRAVENOUS

## 2019-04-05 MED ORDER — SODIUM CHLORIDE 0.9 % IV SOLN
Freq: Once | INTRAVENOUS | Status: AC
  Administered 2019-04-05: 21:00:00 via INTRAVENOUS

## 2019-04-05 MED ORDER — DILTIAZEM HCL 25 MG/5ML IV SOLN: 15.0000 mg | Freq: Once | INTRAVENOUS | Status: DC

## 2019-04-05 MED ORDER — ADENOSINE 12 MG/4ML IV SOLN
12.0000 mg | Freq: Once | INTRAVENOUS | Status: AC
  Administered 2019-04-05: 12 mg via INTRAVENOUS

## 2019-04-05 MED ORDER — DILTIAZEM HCL 25 MG/5ML IV SOLN
5.0000 mg | Freq: Once | INTRAVENOUS | Status: AC
  Administered 2019-04-05: 5 mg via INTRAVENOUS

## 2019-04-05 MED ORDER — AMIODARONE LOAD VIA INFUSION
150.0000 mg | Freq: Once | INTRAVENOUS | Status: AC
  Administered 2019-04-05: 150 mg via INTRAVENOUS
  Filled 2019-04-05: qty 83.34

## 2019-04-05 MED ORDER — INSULIN ASPART 100 UNIT/ML ~~LOC~~ SOLN
4.0000 [IU] | Freq: Once | SUBCUTANEOUS | Status: AC
  Administered 2019-04-05: 4 [IU] via INTRAVENOUS
  Filled 2019-04-05: qty 1

## 2019-04-05 MED ORDER — AMIODARONE HCL IN DEXTROSE 360-4.14 MG/200ML-% IV SOLN
60.0000 mg/h | INTRAVENOUS | Status: DC
  Administered 2019-04-05 – 2019-04-06 (×2): 60 mg/h via INTRAVENOUS
  Filled 2019-04-05: qty 200

## 2019-04-05 MED ORDER — METOPROLOL TARTRATE 5 MG/5ML IV SOLN
INTRAVENOUS | Status: AC
  Filled 2019-04-05: qty 5

## 2019-04-05 MED ORDER — DILTIAZEM HCL 25 MG/5ML IV SOLN
10.0000 mg | Freq: Once | INTRAVENOUS | Status: AC
  Administered 2019-04-05: 10 mg via INTRAVENOUS

## 2019-04-05 MED ORDER — ADENOSINE 6 MG/2ML IV SOLN
6.0000 mg | Freq: Once | INTRAVENOUS | Status: AC
  Administered 2019-04-05: 6 mg via INTRAVENOUS

## 2019-04-05 NOTE — ED Notes (Signed)
Pt desating at 89% on RA. This RN placed pt on 2L via [place]. Pt sating at 94% at this time.

## 2019-04-05 NOTE — ED Notes (Signed)
Pt st noncompliant with medication at home. Pt st "I am hard headed and just don't take my medication". Pt A/Ox4. Pt at 95 RA.

## 2019-04-05 NOTE — ED Provider Notes (Addendum)
[facility] Emergency Department Provider Note   ____________________________________________   First MD Initiated Contact with Patient 04/05/19 1846     (approximate)  I have reviewed the triage vital signs and the nursing notes.   HISTORY  Chief Complaint Irregular Heart Beat    HPI Kurt [name] is a 78 y.o. male with a history of A. fib and right bundle branch block who is on medications for but is noncompliant.  He noticed rapid heartbeat earlier this afternoon.  He has some mild 5 out of 10 chest pain in mid chest is slightly tight.  Heart rate is 170 280.  EMS got some EKGs showing wide-complex tachycardia.  It is a right bundle.  Looks similar to patient's previous right bundle.   Patient given diltiazem 5 and then 10 and then 15.  Heart rate will come down from 1 70-1 80 down to about 130 and then go back up again.  He will not stay down.  We put him on a drip but we did not do anything further.  It discussed the patient with Dr. Ubaldo Glassing cardiology who recommends trying some amiodarone.  We will get him upstairs and work on slowing down his heart rate.  Labs are pending at this point.      Past Medical History:  Diagnosis Date  . AAA (abdominal aortic aneurysm) ([place])   . Diabetes mellitus without complication [facility])     Patient Active Problem List   Diagnosis Date Noted  . Rapid atrial fibrillation ([place]) 04/06/2019  . RBBB 04/06/2019  . Hyperglycemia due to type 2 diabetes mellitus ([place]) 04/06/2019  . Acute on chronic diastolic CHF (congestive heart failure) ([place]) 04/06/2019  . NSTEMI (non-ST elevated myocardial infarction) ([place]) 04/06/2019  . Leukocytosis 02/28/2018  . Altered mental status 02/28/2018  . Facial droop 02/28/2018  . Pressure injury of skin 02/18/2018  . 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 Carotid stenosis 11/14/2017  . Diabetes mellitus type 2, uncomplicated ([place]) 05/03/3233  . Imbalance 07/06/2016  . 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 Tobacco dependence 05/06/2016  . Diabetes ([place]) 05/06/2016  . Abdominal aortic aneurysm without rupture ([place]) 05/06/2016  . Atherosclerosis of native arteries of extremity with intermittent claudication ([place]) 05/06/2016  . PVD (peripheral vascular disease) ([place]) 05/20/2014    Past Surgical History:  Procedure Laterality Date  . ABDOMINAL AORTIC ANEURYSM REPAIR    . arm surgery Left   . EMBOLECTOMY Right 02/17/2018   Procedure: FOGERTY EMBOLECTOMY;  Surgeon: Algernon Huxley, MD;  Location: [facility] ORS;  Service: Vascular;  Laterality: Right;  . FEMORAL-FEMORAL BYPASS GRAFT Right 02/17/2018   Procedure: BYPASS FEMORAL-FEMORAL ARTERY;  Surgeon: Algernon Huxley, MD;  Location: [facility] ORS;  Service: Vascular;  Laterality: Right;  . THROMBECTOMY FEMORAL ARTERY Right 02/17/2018   Procedure: THROMBECTOMY FEMORAL ARTERY;  Surgeon: Algernon Huxley, MD;  Location: [facility] ORS;  Service: Vascular;  Laterality: Right;    Prior to Admission medications   Medication Sig Start Date End Date Taking? Authorizing Provider  apixaban (ELIQUIS) 5 MG TABS tablet Take 1 tablet (5 mg total) by mouth 2 (two) times daily. 03/06/18   Gerlene Fee, NP  Cholecalciferol (D-5000) 5000 units TABS Take 5,000 Units by mouth daily.     [provider]  clopidogrel (PLAVIX) 75 MG tablet Take 1 tablet (75 mg total) by mouth daily. 03/06/18   Gerlene Fee, NP  co-enzyme Q-10 30 MG capsule Take 30 mg  by mouth 3 (three) times daily.    [provider]  docusate sodium (COLACE) 100 MG capsule Take 100 mg by mouth 2 (two) times daily.    [provider]  donepezil (ARICEPT) 5 MG tablet Take 1 tablet (5 mg total) by mouth at bedtime. 03/06/18   Sharee Holster, NP  Garlic Oil 1000 MG CAPS Take 1,000 mg by mouth daily as needed (cholesterol).     [provider]  glimepiride (AMARYL) 2 MG tablet Take 1 tablet (2 mg total) by mouth daily with breakfast. 03/06/18    Sharee Holster, NP  metFORMIN (GLUCOPHAGE-XR) 500 MG 24 hr tablet Take 1 tablet (500 mg total) by mouth daily with breakfast. Patient taking differently: Take 500 mg by mouth daily with breakfast.  03/06/18   Sharee Holster, NP  metoprolol succinate (TOPROL-XL) 25 MG 24 hr tablet Take 1 tablet (25 mg total) by mouth daily. 03/06/18   Sharee Holster, NP  Misc Natural Products (PROSTATE THERAPY COMPLEX PO) Take 562 mg by mouth daily.     [provider]  Red Yeast Rice 600 MG CAPS Take 1 capsule by mouth daily.     [provider]  traMADol (ULTRAM) 50 MG tablet Take 1 tablet (50 mg total) by mouth every 6 (six) hours as needed for moderate pain. Patient not taking: Reported on 11/20/2018 03/06/18   Sharee Holster, NP  vitamin B-12 (CYANOCOBALAMIN) 1000 MCG tablet Take 1,000 mcg by mouth daily.     [provider]    Allergies Lipitor [atorvastatin calcium]  Family History  Problem Relation Age of Onset  . Alzheimer's disease Mother   . Heart disease Brother     Social History Social History   Tobacco Use  . Smoking status: Heavy Tobacco Smoker  . Smokeless tobacco: Current User  Substance Use Topics  . Alcohol use: Not Currently  . Drug use: Never    Review of Systems  Constitutional: No fever/chills Eyes: No visual changes. ENT: No sore throat. Cardiovascular: Mild chest pain. Respiratory: Denies shortness of breath. Gastrointestinal: No abdominal pain.  No nausea, no vomiting.  No diarrhea.  No constipation. Genitourinary: Negative for dysuria. Musculoskeletal: Negative for back pain. Skin: Negative for rash. Neurological: Negative for headaches, focal weakness  ____________________________________________   PHYSICAL EXAM:  VITAL SIGNS: ED Triage Vitals  Enc Vitals Group     BP 04/05/19 1835 103/84     Pulse Rate 04/05/19 1843 (!) 51     Resp 04/05/19 1840 20     Temp --      Temp src --      SpO2 04/05/19 1843 95 %      Weight --      Height --      Head Circumference --      Peak Flow --      Pain Score --      Pain Loc --      Pain Edu? --      Excl. in GC? --     Constitutional: Alert and oriented. Well appearing and in no acute distress. Eyes: Conjunctivae are normal.  Head: Atraumatic. Nose: No congestion/rhinnorhea. Mouth/Throat: Mucous membranes are moist.  Oropharynx non-erythematous. Neck: No stridor.   Cardiovascular: Very rapid rate, regular rhythm. Grossly normal heart sounds.  Good peripheral circulation. Respiratory: Normal respiratory effort.  No retractions. Lungs CTAB. Gastrointestinal: Soft and nontender. No distention. No abdominal bruits. No CVA tenderness. Musculoskeletal: No lower extremity tenderness nor  edema.  No joint effusions. Neurologic:  Normal speech and language. No gross focal neurologic deficits are appreciated. No gait instability. Skin:  Skin is warm, dry and intact. No rash noted.   ____________________________________________   LABS (all labs ordered are listed, but only abnormal results are displayed)  Labs Reviewed  COMPREHENSIVE METABOLIC PANEL - Abnormal; Notable for the following components:      Result Value   Glucose, Bld 575 (*)    Creatinine, Ser 1.37 (*)    AST 87 (*)    ALT 61 (*)    GFR calc non Af Amer 50 (*)    GFR calc Af Amer 58 (*)    All other components within normal limits  BRAIN NATRIURETIC PEPTIDE - Abnormal; Notable for the following components:   B Natriuretic Peptide 195.0 (*)    All other components within normal limits  CBC WITH DIFFERENTIAL/PLATELET - Abnormal; Notable for the following components:   RBC 4.20 (*)    Platelets 144 (*)    All other components within normal limits  GLUCOSE, CAPILLARY - Abnormal; Notable for the following components:   Glucose-Capillary 418 (*)    All other components within normal limits  TROPONIN I (HIGH SENSITIVITY) - Abnormal; Notable for the following components:   Troponin I (High  Sensitivity) 92 (*)    All other components within normal limits  TROPONIN I (HIGH SENSITIVITY) - Abnormal; Notable for the following components:   Troponin I (High Sensitivity) 1,552 (*)    All other components within normal limits  SARS CORONAVIRUS 2 (TAT 6-24 HRS)  URINALYSIS, COMPLETE (UACMP) WITH MICROSCOPIC  CBG MONITORING, ED   ____________________________________________  EKG  EKG read interpreted by me shows wide-complex tachycardia likely A. fib with RVR.  Heart rate was 172 computer reads axis extreme right axis although much sure this is correct.  Rate related changes. ____________________________________________  RADIOLOGY  ED MD interpretation: Chest x-ray read by radiology reviewed by me shows increased vascularity likely congestive failure.  Patient sats are okay though and he especially by the time his heart rate goes down feels fine.  CHF is probably rate related. EKG done at 1148 shows sinus bradycardia rate 52 left axis bradycardia rate of 52 left axis there is hint of ST elevation in aVF and V4 5 and 6 but not 1 mm.  The patient is pain-free at this point. DG Chest Portable 1 View  Result Date: 04/05/2019 CLINICAL DATA:  Atrial fibrillation and chest pain EXAM: PORTABLE CHEST 1 VIEW COMPARISON:  02/25/2018 FINDINGS: Cardiac shadow is enlarged but stable. Aortic calcifications are again seen. Vascular congestion and interstitial edema is noted. No sizable effusion is seen. No focal infiltrate is noted. IMPRESSION: Changes consistent with congestive failure. Electronically Signed   By: Alcide Clever M.D.   On: 04/05/2019 23:42    ____________________________________________   PROCEDURES  Procedure(s) performed (including Critical Care): Echo care time 45 minutes.  I been in and out of this gentleman's room with the adenosine and then with the diltiazem and the amiodarone.  I have discussed with the hospitalist several times in with Dr. Lady Gary  twice.  Procedures   ____________________________________________   INITIAL IMPRESSION / ASSESSMENT AND PLAN / ED COURSE  Patient got adenosine 6 with no results then 12 with no results of course we tried vagal maneuvers also with no results we gave him diltiazem 5 and then 10 and 15 heart rate slowed from 180 down to 130 in a couple times but then  would go up again.  The drip did not have any effect.  Discussed with Dr. Lady GaryFath will get him in the hospital on amiodarone ----------------------------------------- 11:45 PM on 04/05/2019 -----------------------------------------  Patient does not remember if he took his Eliquis.  His wife usually gives them to him but they had an argument a couple days ago and he has been trying to take himself and may be forgetting his he has short-term memory loss.  His heart rate is now in the 50s.  Blood pressure is 107 systolic.  I had planned on giving him more amiodarone but I will just continue the current drip.   Patient's chest pain is completely gone now.  I will not do heparin at this point I will just consist continue his Eliquis.           ____________________________________________   FINAL CLINICAL IMPRESSION(S) / ED DIAGNOSES  Final diagnoses:  Atrial fibrillation with RVR [facility](HCC)     ED Discharge Orders    None       Note:  This document was prepared using Dragon voice recognition software and may include unintentional dictation errors.    Arnaldo NatalMalinda, Curtez Brallier F, MD 04/06/19 [id number]    Arnaldo NatalMalinda, Zyren Sevigny F, MD 04/06/19 [phone]

## 2019-04-05 NOTE — ED Notes (Signed)
MD Ducan at bedside at this time.

## 2019-04-05 NOTE — ED Triage Notes (Signed)
Pt from home via [facility]. Per EMS, pt central chest pain started at 1730; Per EMS pt HR 180's; pt with hx with Afib pt noncompliant with medication (metropolol). Pt from home via [facility]. metoprolol 2.5mg  given by EMS; vagal maneuver w/o change.

## 2019-04-05 NOTE — ED Notes (Signed)
ED Provider [facility] made aware of pt's VS.

## 2019-04-05 NOTE — ED Notes (Signed)
Diltiazem drip stopped at this time per VO EDP Malinda.

## 2019-04-05 NOTE — ED Notes (Signed)
Power of [employer] at bedside at this time.

## 2019-04-05 NOTE — ED Notes (Signed)
Date and time results received: 04/05/19 2331 (use smartphrase ".now" to insert current time)  Test: troponins  Critical Value: 1552  Name of Provider Notified: Karma Greaser, MD   Orders Received? Or Actions Taken?: no new orders

## 2019-04-05 NOTE — ED Notes (Addendum)
Per Dr. Marcos Eke, pt to be NPO at this time. PT given mouth swabs

## 2019-04-05 NOTE — ED Notes (Signed)
ED Provider Malinda  at bedside. 

## 2019-04-05 NOTE — ED Notes (Signed)
PT's HR down to 50's. Dr. Cinda Quest made aware. Orders for repeat EKG. Printed and given to MD. Orders to d/c amioderone after bag completion.

## 2019-04-06 ENCOUNTER — Encounter: Payer: Self-pay | Admitting: Internal Medicine

## 2019-04-06 ENCOUNTER — Inpatient Hospital Stay
Admit: 2019-04-06 | Discharge: 2019-04-06 | Disposition: A | Discharge status: Home / Self Care | Payer: Medicare Other | Attending: Internal Medicine | Admitting: Internal Medicine

## 2019-04-06 DIAGNOSIS — R778 Other specified abnormalities of plasma proteins: Secondary | ICD-10-CM | POA: Diagnosis not present

## 2019-04-06 DIAGNOSIS — I4891 Unspecified atrial fibrillation: Secondary | ICD-10-CM | POA: Diagnosis not present

## 2019-04-06 DIAGNOSIS — E1151 Type 2 diabetes mellitus with diabetic peripheral angiopathy without gangrene: Secondary | ICD-10-CM | POA: Diagnosis present

## 2019-04-06 DIAGNOSIS — I214 Non-ST elevation (NSTEMI) myocardial infarction: Secondary | ICD-10-CM

## 2019-04-06 DIAGNOSIS — Z79899 Other long term (current) drug therapy: Secondary | ICD-10-CM | POA: Diagnosis not present

## 2019-04-06 DIAGNOSIS — I255 Ischemic cardiomyopathy: Secondary | ICD-10-CM | POA: Diagnosis present

## 2019-04-06 DIAGNOSIS — Z7901 Long term (current) use of anticoagulants: Secondary | ICD-10-CM | POA: Diagnosis not present

## 2019-04-06 DIAGNOSIS — I5023 Acute on chronic systolic (congestive) heart failure: Secondary | ICD-10-CM | POA: Diagnosis not present

## 2019-04-06 DIAGNOSIS — I4892 Unspecified atrial flutter: Secondary | ICD-10-CM | POA: Diagnosis present

## 2019-04-06 DIAGNOSIS — I482 Chronic atrial fibrillation, unspecified: Secondary | ICD-10-CM | POA: Diagnosis present

## 2019-04-06 DIAGNOSIS — I959 Hypotension, unspecified: Secondary | ICD-10-CM | POA: Diagnosis present

## 2019-04-06 DIAGNOSIS — I472 Ventricular tachycardia: Principal | ICD-10-CM

## 2019-04-06 DIAGNOSIS — I5033 Acute on chronic diastolic (congestive) heart failure: Secondary | ICD-10-CM

## 2019-04-06 DIAGNOSIS — F17200 Nicotine dependence, unspecified, uncomplicated: Secondary | ICD-10-CM | POA: Diagnosis present

## 2019-04-06 DIAGNOSIS — G9341 Metabolic encephalopathy: Secondary | ICD-10-CM | POA: Diagnosis present

## 2019-04-06 DIAGNOSIS — E1165 Type 2 diabetes mellitus with hyperglycemia: Secondary | ICD-10-CM | POA: Diagnosis present

## 2019-04-06 DIAGNOSIS — I4510 Unspecified right bundle-branch block: Secondary | ICD-10-CM

## 2019-04-06 DIAGNOSIS — Z8249 Family history of ischemic heart disease and other diseases of the circulatory system: Secondary | ICD-10-CM | POA: Diagnosis not present

## 2019-04-06 DIAGNOSIS — L89152 Pressure ulcer of sacral region, stage 2: Secondary | ICD-10-CM | POA: Diagnosis present

## 2019-04-06 DIAGNOSIS — Z8673 Personal history of transient ischemic attack (TIA), and cerebral infarction without residual deficits: Secondary | ICD-10-CM | POA: Diagnosis not present

## 2019-04-06 DIAGNOSIS — R000 Tachycardia, unspecified: Secondary | ICD-10-CM

## 2019-04-06 DIAGNOSIS — I739 Peripheral vascular disease, unspecified: Secondary | ICD-10-CM

## 2019-04-06 DIAGNOSIS — I2510 Atherosclerotic heart disease of native coronary artery without angina pectoris: Secondary | ICD-10-CM | POA: Diagnosis present

## 2019-04-06 DIAGNOSIS — Z7984 Long term (current) use of oral hypoglycemic drugs: Secondary | ICD-10-CM | POA: Diagnosis not present

## 2019-04-06 DIAGNOSIS — I441 Atrioventricular block, second degree: Secondary | ICD-10-CM | POA: Diagnosis present

## 2019-04-06 DIAGNOSIS — Z20828 Contact with and (suspected) exposure to other viral communicable diseases: Secondary | ICD-10-CM | POA: Diagnosis present

## 2019-04-06 DIAGNOSIS — Z7902 Long term (current) use of antithrombotics/antiplatelets: Secondary | ICD-10-CM | POA: Diagnosis not present

## 2019-04-06 LAB — ECHOCARDIOGRAM COMPLETE
Height: 75 in
Weight: 2480 oz

## 2019-04-06 LAB — MAGNESIUM: Magnesium: 1.6 mg/dL — ABNORMAL LOW (ref 1.7–2.4)

## 2019-04-06 LAB — PROTIME-INR
INR: 1.2 (ref 0.8–1.2)
Prothrombin Time: 15.1 seconds (ref 11.4–15.2)

## 2019-04-06 LAB — GLUCOSE, CAPILLARY
Glucose-Capillary: 423 mg/dL — ABNORMAL HIGH (ref 70–99)
Glucose-Capillary: 386 mg/dL — ABNORMAL HIGH (ref 70–99)
Glucose-Capillary: 403 mg/dL — ABNORMAL HIGH (ref 70–99)
Glucose-Capillary: 279 mg/dL — ABNORMAL HIGH (ref 70–99)
Glucose-Capillary: 129 mg/dL — ABNORMAL HIGH (ref 70–99)
Glucose-Capillary: 108 mg/dL — ABNORMAL HIGH (ref 70–99)
Glucose-Capillary: 119 mg/dL — ABNORMAL HIGH (ref 70–99)
Glucose-Capillary: 140 mg/dL — ABNORMAL HIGH (ref 70–99)
Glucose-Capillary: 146 mg/dL — ABNORMAL HIGH (ref 70–99)
Glucose-Capillary: 149 mg/dL — ABNORMAL HIGH (ref 70–99)
Glucose-Capillary: 205 mg/dL — ABNORMAL HIGH (ref 70–99)

## 2019-04-06 LAB — BASIC METABOLIC PANEL
Anion gap: 11 (ref 5–15)
BUN: 20 mg/dL (ref 8–23)
CO2: 20 mmol/L — ABNORMAL LOW (ref 22–32)
Calcium: 8.3 mg/dL — ABNORMAL LOW (ref 8.9–10.3)
Chloride: 106 mmol/L (ref 98–111)
Creatinine, Ser: 1.27 mg/dL — ABNORMAL HIGH (ref 0.61–1.24)
GFR calc Af Amer: >60 mL/min (ref >60)
GFR calc non Af Amer: 55 mL/min — ABNORMAL LOW (ref >60)
Glucose, Bld: 513 mg/dL (ref 70–99)
Potassium: 3.5 mmol/L (ref 3.5–5.1)
Sodium: 137 mmol/L (ref 135–145)

## 2019-04-06 LAB — LIPID PANEL
Cholesterol: 154 mg/dL (ref 0–200)
HDL: 31 mg/dL — ABNORMAL LOW (ref >40)
LDL Cholesterol: 101 mg/dL — ABNORMAL HIGH (ref 0–99)
Total CHOL/HDL Ratio: 5 RATIO
Triglycerides: 109 mg/dL (ref <150)
VLDL: 22 mg/dL (ref 0–40)

## 2019-04-06 LAB — CBC
HCT: 36.7 % — ABNORMAL LOW (ref 39.0–52.0)
Hemoglobin: 12.8 g/dL — ABNORMAL LOW (ref 13.0–17.0)
MCH: 32.9 pg (ref 26.0–34.0)
MCHC: 34.9 g/dL (ref 30.0–36.0)
MCV: 94.3 fL (ref 80.0–100.0)
Platelets: 126 10*3/uL — ABNORMAL LOW (ref 150–400)
RBC: 3.89 MIL/uL — ABNORMAL LOW (ref 4.22–5.81)
RDW: 13.1 % (ref 11.5–15.5)
WBC: 8 10*3/uL (ref 4.0–10.5)
nRBC: 0 % (ref 0.0–0.2)

## 2019-04-06 LAB — APTT
aPTT: 79 seconds — ABNORMAL HIGH (ref 24–36)
aPTT: 62 seconds — ABNORMAL HIGH (ref 24–36)
aPTT: 70 seconds — ABNORMAL HIGH (ref 24–36)

## 2019-04-06 LAB — HEMOGLOBIN A1C
Hgb A1c MFr Bld: 7.8 % — ABNORMAL HIGH (ref 4.8–5.6)
Mean Plasma Glucose: 177.16 mg/dL

## 2019-04-06 LAB — TSH: TSH: 1.245 u[IU]/mL (ref 0.350–4.500)

## 2019-04-06 LAB — SARS CORONAVIRUS 2 (TAT 6-24 HRS): SARS Coronavirus 2: NEGATIVE

## 2019-04-06 MED ORDER — INSULIN ASPART 100 UNIT/ML ~~LOC~~ SOLN
0.0000 [IU] | Freq: Every day | SUBCUTANEOUS | Status: DC
  Administered 2019-04-06: 21:00:00 2 [IU] via SUBCUTANEOUS
  Filled 2019-04-06: qty 1

## 2019-04-06 MED ORDER — INSULIN ASPART 100 UNIT/ML ~~LOC~~ SOLN
0.0000 [IU] | Freq: Three times a day (TID) | SUBCUTANEOUS | Status: DC
  Administered 2019-04-06 (×2): 1 [IU] via SUBCUTANEOUS
  Administered 2019-04-07: 3 [IU] via SUBCUTANEOUS
  Administered 2019-04-07 – 2019-04-08 (×2): 2 [IU] via SUBCUTANEOUS
  Administered 2019-04-09: 12:00:00 3 [IU] via SUBCUTANEOUS
  Administered 2019-04-09: 17:00:00 2 [IU] via SUBCUTANEOUS
  Administered 2019-04-09 – 2019-04-10 (×2): 1 [IU] via SUBCUTANEOUS
  Administered 2019-04-10: 13:00:00 2 [IU] via SUBCUTANEOUS
  Administered 2019-04-11: 13:00:00 5 [IU] via SUBCUTANEOUS
  Administered 2019-04-11 – 2019-04-12 (×2): 2 [IU] via SUBCUTANEOUS
  Administered 2019-04-12: 14:00:00 3 [IU] via SUBCUTANEOUS
  Filled 2019-04-06 (×14): qty 1

## 2019-04-06 MED ORDER — INSULIN ASPART 100 UNIT/ML ~~LOC~~ SOLN: 2.0000 [IU] | SUBCUTANEOUS | Status: DC

## 2019-04-06 MED ORDER — DEXTROSE 50 % IV SOLN: 0.0000 mL | INTRAVENOUS | Status: DC | PRN

## 2019-04-06 MED ORDER — PERFLUTREN LIPID MICROSPHERE
1.0000 mL | INTRAVENOUS | Status: AC | PRN
  Administered 2019-04-06: 3 mL via INTRAVENOUS
  Filled 2019-04-06: qty 10

## 2019-04-06 MED ORDER — MAGNESIUM SULFATE BOLUS VIA INFUSION: 2.0000 g | Freq: Once | INTRAVENOUS | Status: DC

## 2019-04-06 MED ORDER — ONDANSETRON HCL 4 MG/2ML IJ SOLN: 4.0000 mg | Freq: Four times a day (QID) | INTRAMUSCULAR | Status: DC | PRN

## 2019-04-06 MED ORDER — INSULIN GLARGINE 100 UNIT/ML ~~LOC~~ SOLN
11.0000 [IU] | Freq: Every day | SUBCUTANEOUS | Status: DC
  Administered 2019-04-06 – 2019-04-07 (×2): 11 [IU] via SUBCUTANEOUS
  Filled 2019-04-06 (×3): qty 0.11

## 2019-04-06 MED ORDER — ASPIRIN EC 81 MG PO TBEC
81.0000 mg | DELAYED_RELEASE_TABLET | Freq: Every day | ORAL | Status: DC
  Administered 2019-04-06 – 2019-04-12 (×7): 81 mg via ORAL
  Filled 2019-04-06 (×7): qty 1

## 2019-04-06 MED ORDER — METOPROLOL SUCCINATE ER 25 MG PO TB24: 25.0000 mg | ORAL_TABLET | Freq: Every day | ORAL | Status: DC

## 2019-04-06 MED ORDER — DEXTROSE-NACL 5-0.45 % IV SOLN
INTRAVENOUS | Status: DC
  Administered 2019-04-06: 06:00:00 via INTRAVENOUS

## 2019-04-06 MED ORDER — INSULIN REGULAR(HUMAN) IN NACL 100-0.9 UT/100ML-% IV SOLN
INTRAVENOUS | Status: DC
  Administered 2019-04-06: 03:00:00 10.5 [IU]/h via INTRAVENOUS
  Filled 2019-04-06: qty 100

## 2019-04-06 MED ORDER — DONEPEZIL HCL 5 MG PO TABS
5.0000 mg | ORAL_TABLET | Freq: Every day | ORAL | Status: DC
  Filled 2019-04-06: qty 1

## 2019-04-06 MED ORDER — AMIODARONE HCL IN DEXTROSE 360-4.14 MG/200ML-% IV SOLN: 30.0000 mg/h | INTRAVENOUS | Status: DC

## 2019-04-06 MED ORDER — DONEPEZIL HCL 5 MG PO TABS
5.0000 mg | ORAL_TABLET | Freq: Every day | ORAL | Status: DC
  Administered 2019-04-07 – 2019-04-12 (×5): 5 mg via ORAL
  Filled 2019-04-06 (×6): qty 1

## 2019-04-06 MED ORDER — DOCUSATE SODIUM 100 MG PO CAPS
100.0000 mg | ORAL_CAPSULE | Freq: Two times a day (BID) | ORAL | Status: DC
  Administered 2019-04-06 – 2019-04-12 (×11): 100 mg via ORAL
  Filled 2019-04-06 (×11): qty 1

## 2019-04-06 MED ORDER — HEPARIN BOLUS VIA INFUSION
2000.0000 [IU] | Freq: Once | INTRAVENOUS | Status: AC
  Administered 2019-04-06: 02:00:00 2000 [IU] via INTRAVENOUS
  Filled 2019-04-06: qty 2000

## 2019-04-06 MED ORDER — HEPARIN (PORCINE) 25000 UT/250ML-% IV SOLN
900.0000 [IU]/h | INTRAVENOUS | Status: DC
  Administered 2019-04-06: 800 [IU]/h via INTRAVENOUS
  Administered 2019-04-07 – 2019-04-08 (×2): 900 [IU]/h via INTRAVENOUS
  Filled 2019-04-06 (×3): qty 250

## 2019-04-06 MED ORDER — MAGNESIUM SULFATE 2 GM/50ML IV SOLN
2.0000 g | Freq: Once | INTRAVENOUS | Status: AC
  Administered 2019-04-06: 12:00:00 2 g via INTRAVENOUS
  Filled 2019-04-06: qty 50

## 2019-04-06 MED ORDER — FUROSEMIDE 10 MG/ML IJ SOLN: 20.0000 mg | Freq: Once | INTRAMUSCULAR | Status: DC

## 2019-04-06 MED ORDER — FUROSEMIDE 10 MG/ML IJ SOLN
20.0000 mg | Freq: Every day | INTRAMUSCULAR | Status: DC
  Administered 2019-04-07: 20 mg via INTRAVENOUS
  Filled 2019-04-06: qty 2

## 2019-04-06 MED ORDER — SODIUM CHLORIDE 0.9 % IV SOLN
INTRAVENOUS | Status: DC
  Administered 2019-04-06: 03:00:00 via INTRAVENOUS

## 2019-04-06 MED ORDER — ACETAMINOPHEN 325 MG PO TABS
650.0000 mg | ORAL_TABLET | ORAL | Status: DC | PRN
  Administered 2019-04-12: 650 mg via ORAL
  Filled 2019-04-06: qty 2

## 2019-04-06 NOTE — ED Notes (Signed)
Amio drip stopped by Dr. Ubaldo Glassing

## 2019-04-06 NOTE — ED Notes (Signed)
Alaris pump alarming; this nurse reset it. Family at bedside; pt calm and cooperative

## 2019-04-06 NOTE — ED Notes (Signed)
Noted monitor leads not connected. Pt attempting to slide out of end of bed. When asked what he is doing, pt states, "I'm going home." Pt reoriented to place, time, and situation. Assisted pt back to top of bed with [place], EDT. Leads replaced. PIV remains wrapped in ACE wrap at this time. EDT to sit with patient for safety. Charge RN aware.

## 2019-04-06 NOTE — Progress Notes (Signed)
ANTICOAGULATION CONSULT NOTE  Pharmacy Consult for Heparin Indication: chest pain/ACS  Allergies  Allergen Reactions  . Lipitor [Atorvastatin Calcium] Rash    Patient Measurements: Height: 6\' 3"  (190.5 cm) Weight: 148 lb 8 oz (67.4 kg) IBW/kg (Calculated) : 84.5 HEPARIN DW (KG): 67.4  Vital Signs: Temp: 98.8 F (37.1 C) (12/12 2050) Temp Source: Oral (12/12 2050) BP: 130/59 (12/12 2050) Pulse Rate: 52 (12/12 2050)  Labs: Recent Labs    04/05/19 1953 04/05/19 2241 04/06/19 0333 04/06/19 1144 04/06/19 2124  HGB 14.0  --  12.8*  --   --   HCT 40.9  --  36.7*  --   --   PLT 144*  --  126*  --   --   APTT  --   --  79* 62* 70*  LABPROT  --   --  15.1  --   --   INR  --   --  1.2  --   --   CREATININE 1.37*  --  1.27*  --   --   TROPONINIHS 92* 1,552*  --   --   --     Estimated Creatinine Clearance: 47.2 mL/min (A) (by C-G formula based on SCr of 1.27 mg/dL (H)).  Medical History: Past Medical History:  Diagnosis Date  . AAA (abdominal aortic aneurysm) (HCC)   . Diabetes mellitus without complication (HCC)     Medications:  Medications Prior to Admission  Medication Sig Dispense Refill Last Dose  . apixaban (ELIQUIS) 5 MG TABS tablet Take 1 tablet (5 mg total) by mouth 2 (two) times daily. 60 tablet 0 48+ hours at Unknown  . Cholecalciferol (D-5000) 5000 units TABS Take 5,000 Units by mouth daily.      . clopidogrel (PLAVIX) 75 MG tablet Take 1 tablet (75 mg total) by mouth daily. 30 tablet 0 48+ hours at Unknown  . co-enzyme Q-10 30 MG capsule Take 30 mg by mouth 3 (three) times daily.     2125 docusate sodium (COLACE) 100 MG capsule Take 100 mg by mouth 2 (two) times daily.     Marland Kitchen donepezil (ARICEPT) 5 MG tablet Take 1 tablet (5 mg total) by mouth at bedtime. 30 tablet 0 48+ hours at Unknown  . Garlic Oil 1000 MG CAPS Take 1,000 mg by mouth daily as needed (cholesterol).    Unknown at PRN  . glimepiride (AMARYL) 2 MG tablet Take 2 mg by mouth 2 (two) times daily  with a meal.   48+ hours at Unknown  . lisinopril (ZESTRIL) 2.5 MG tablet Take 2.5 mg by mouth daily.   48+ hours at Unknown  . metFORMIN (GLUCOPHAGE) 500 MG tablet Take 1,000 mg by mouth 2 (two) times daily with a meal.   48+ hours at Unknown  . metoprolol succinate (TOPROL-XL) 50 MG 24 hr tablet Take 25 mg by mouth daily. Take with or immediately following a meal.     . Misc Natural Products (PROSTATE THERAPY COMPLEX PO) Take 562 mg by mouth daily.      . Red Yeast Rice 600 MG CAPS Take 1 capsule by mouth daily.      . vitamin B-12 (CYANOCOBALAMIN) 1000 MCG tablet Take 1,000 mcg by mouth daily.        Assessment: Pt is on Apixaban 5mg  BID PTA.  Baseline labs ordered.  Pharmacy asked to initiate and monitor Heparin for ACS.  Will use aPTT to manage Heparin.  Pt is on IV amiodarone.    12/12 1144  APTT 62 - rate increased to 900 units/hr  Goal of Therapy:  aPTT 66-102 seconds Monitor platelets by anticoagulation protocol: Yes   Plan:  12/12 2124 APTT 70 therapeutic. Will continue heparin drip @ 900 units/hr.   CBC/HL/APTT with am labs - can drop off APTT after am labs if APTT/HL coordinate  Lu Duffel, PharmD, BCPS Clinical Pharmacist 04/06/2019 10:11 PM

## 2019-04-06 NOTE — ED Notes (Signed)
Spoke to Dr. Cinda Quest who spoke to Dr. Ubaldo Glassing who wishes for amio drip to continue. See [facility]

## 2019-04-06 NOTE — ED Notes (Signed)
Pt given dinner tray.

## 2019-04-06 NOTE — Progress Notes (Signed)
[place] for Heparin Indication: chest pain/ACS  Allergies  Allergen Reactions  . Lipitor [Atorvastatin Calcium] Rash    Patient Measurements: Height: 6\' 3"  (190.5 cm) Weight: 155 lb (70.3 kg) IBW/kg (Calculated) : 84.5 HEPARIN DW (KG): 70.3  Vital Signs: Temp: 98.2 F (36.8 C) (12/12 0637) Temp Source: Oral (12/12 0637) BP: 121/62 (12/12 1200) Pulse Rate: 44 (12/12 1200)  Labs: Recent Labs    04/05/19 1953 04/05/19 2241 04/06/19 0333 04/06/19 1144  HGB 14.0  --  12.8*  --   HCT 40.9  --  36.7*  --   PLT 144*  --  126*  --   APTT  --   --  79* 62*  LABPROT  --   --  15.1  --   INR  --   --  1.2  --   CREATININE 1.37*  --  1.27*  --   TROPONINIHS 92* 1,552*  --   --     Estimated Creatinine Clearance: 49.2 mL/min (A) (by C-G formula based on SCr of 1.27 mg/dL (H)).  Medical History: Past Medical History:  Diagnosis Date  . AAA (abdominal aortic aneurysm) ([place])   . Diabetes mellitus without complication ([place])     Medications:  (Not in a hospital admission)   Assessment: Pt is on Apixaban 5mg  BID PTA.  Baseline labs ordered.  Pharmacy asked to initiate and monitor Heparin for ACS.  Will use aPTT to manage Heparin.  Pt is on IV amiodarone.    Goal of Therapy:  aPTT 66-102 seconds Monitor platelets by anticoagulation protocol: Yes   Plan:  12/12 1144 APTT 62 slightly subtherapeutic. Will increase heparin drip to 900 units/hr. APTT at 2100. CBC and HL daily.  Tawnya Crook, PharmD 04/06/2019,12:17 PM

## 2019-04-06 NOTE — ED Notes (Signed)
Pt has met criteria to be weaned off insulin drip per Endotool which recommends 11 units Lantus to start transition to subQ. Message sent to Dr. Leslye Peer via secure chat.

## 2019-04-06 NOTE — ED Notes (Signed)
Pt given meal tray. Pts visitor in room with pt assisting with needs.

## 2019-04-06 NOTE — Consult Note (Signed)
Cardiology Consultation Note    Patient ID: Kurt [name], MRN: [id number], DOB/AGE: 1940/11/07 78 y.o. Admit date: 04/05/2019   Date of Consult: 04/06/2019 Primary Physician: Jaclyn Shaggy, MD Primary Cardiologist: Dr. Juliann Pares  Chief Complaint: tachycardia Reason for Consultation: tachycardia Requesting MD: Dr. Renae Gloss  HPI: Kurt [name] is a 78 y.o. male with history of type 2 diabetes, tobacco abuse, peripheral vascular disease with an abdominal aortic aneurysm status post endovascular stent, history of atrial fibrillation on Eliquis who presented to the emergency room with nausea left parasternal discomfort.  Patient was noted to be hypotensive and tachycardic.  He was given amiodarone with improvement in his heart rate.  His high-sensitivity of troponin was initially 19 and increased to 1552. Magnesium is 1.6, TSH was normal, serum glucose was 575 on presentation with a potassium of 4.1.  Creatinine 1.37 up from a baseline of 1.  Patient has a history of a cardiomyopathy with an echocardiogram 1 year ago read as showing an EF of 25%.  Patient currently is relatively hemodynamic stable with a heart rate of 50 and a pressure of 102/56.  He is currently on an amiodarone drip.  He is also on metoprolol succinate 25 mg daily which she is on as an outpatient.  He is also on Eliquis as an outpatient.  He was also started on metoprolol succinate at 25 mg daily.  He is on heparin and Eliquis has been held.  His last dose of Eliquis apparently was on Friday.  Patient is a difficult historian however.  He currently denies any chest pain.  He denies shortness of breath.  He feels back to baseline.  Caregiver states that he had an episodes of chest tightness yesterday which were new and resolved when his heart rate improved.  He has been compliant with his medications.  Chest x-ray shows evidence of pulmonary edema bilaterally. Past Medical History:  Diagnosis Date  . AAA (abdominal aortic aneurysm)  (HCC)   . Diabetes mellitus without complication [facility])       Surgical History:  Past Surgical History:  Procedure Laterality Date  . ABDOMINAL AORTIC ANEURYSM REPAIR    . arm surgery Left   . EMBOLECTOMY Right 02/17/2018   Procedure: FOGERTY EMBOLECTOMY;  Surgeon: Annice Needy, MD;  Location: [facility] ORS;  Service: Vascular;  Laterality: Right;  . FEMORAL-FEMORAL BYPASS GRAFT Right 02/17/2018   Procedure: BYPASS FEMORAL-FEMORAL ARTERY;  Surgeon: Annice Needy, MD;  Location: [facility] ORS;  Service: Vascular;  Laterality: Right;  . THROMBECTOMY FEMORAL ARTERY Right 02/17/2018   Procedure: THROMBECTOMY FEMORAL ARTERY;  Surgeon: Annice Needy, MD;  Location: [facility] ORS;  Service: Vascular;  Laterality: Right;     Home Meds: Prior to Admission medications   Medication Sig Start Date End Date Taking? Authorizing Provider  apixaban (ELIQUIS) 5 MG TABS tablet Take 1 tablet (5 mg total) by mouth 2 (two) times daily. 03/06/18   Sharee Holster, NP  Cholecalciferol (D-5000) 5000 units TABS Take 5,000 Units by mouth daily.     [provider]  clopidogrel (PLAVIX) 75 MG tablet Take 1 tablet (75 mg total) by mouth daily. 03/06/18   Sharee Holster, NP  co-enzyme Q-10 30 MG capsule Take 30 mg by mouth 3 (three) times daily.    [provider]  docusate sodium (COLACE) 100 MG capsule Take 100 mg by mouth 2 (two) times daily.    [provider]  donepezil (ARICEPT) 5 MG tablet Take 1  tablet (5 mg total) by mouth at bedtime. 03/06/18   Gerlene Fee, NP  Garlic Oil 0258 MG CAPS Take 1,000 mg by mouth daily as needed (cholesterol).     [provider]  glimepiride (AMARYL) 2 MG tablet Take 1 tablet (2 mg total) by mouth daily with breakfast. 03/06/18   Gerlene Fee, NP  metFORMIN (GLUCOPHAGE-XR) 500 MG 24 hr tablet Take 1 tablet (500 mg total) by mouth daily with breakfast. Patient taking differently: Take 500 mg by mouth daily with breakfast.  03/06/18   Gerlene Fee, NP  metoprolol succinate (TOPROL-XL) 25 MG 24 hr tablet Take 1 tablet (25 mg total) by mouth daily. 03/06/18   Gerlene Fee, NP  Misc Natural Products (PROSTATE THERAPY COMPLEX PO) Take 562 mg by mouth daily.     [provider]  Red Yeast Rice 600 MG CAPS Take 1 capsule by mouth daily.     [provider]  traMADol (ULTRAM) 50 MG tablet Take 1 tablet (50 mg total) by mouth every 6 (six) hours as needed for moderate pain. Patient not taking: Reported on 11/20/2018 03/06/18   Gerlene Fee, NP  vitamin B-12 (CYANOCOBALAMIN) 1000 MCG tablet Take 1,000 mcg by mouth daily.     [provider]    Inpatient Medications:  . aspirin EC  81 mg Oral Daily  . insulin aspart  2-6 Units Subcutaneous Q4H  . metoprolol succinate  25 mg Oral Daily   . sodium chloride 75 mL/hr at 04/06/19 0238  . amiodarone 30 mg/hr (04/06/19 0615)  . dextrose 5 % and 0.45% NaCl 75 mL/hr at 04/06/19 0614  . heparin 800 Units/hr (04/06/19 0158)  . insulin 1 Units/hr (04/06/19 5277)    Allergies:  Allergies  Allergen Reactions  . Lipitor [Atorvastatin Calcium] Rash    Social History   Socioeconomic History  . Marital status: Married    Spouse name: Not on file  . Number of children: Not on file  . Years of education: Not on file  . Highest education level: Not on file  Occupational History  . Not on file  Tobacco Use  . Smoking status: Heavy Tobacco Smoker  . Smokeless tobacco: Current User  Substance and Sexual Activity  . Alcohol use: Not Currently  . Drug use: Never  . Sexual activity: Not on file  Other Topics Concern  . Not on file  Social History Narrative  . Not on file   Social Determinants of Health   Financial Resource Strain:   . Difficulty of Paying Living Expenses: Not on file  Food Insecurity:   . Worried About Charity fundraiser in the Last Year: Not on file  . Ran Out of Food in the Last Year: Not on file  Transportation Needs:   . Lack of  Transportation (Medical): Not on file  . Lack of Transportation (Non-Medical): Not on file  Physical Activity:   . Days of Exercise per Week: Not on file  . Minutes of Exercise per Session: Not on file  Stress:   . Feeling of Stress : Not on file  Social Connections:   . Frequency of Communication with Friends and Family: Not on file  . Frequency of Social Gatherings with Friends and Family: Not on file  . Attends Religious Services: Not on file  . Active Member of Clubs or Organizations: Not on file  . Attends Archivist Meetings: Not on file  . Marital Status: Not  on file  Intimate Partner Violence:   . Fear of Current or Ex-Partner: Not on file  . Emotionally Abused: Not on file  . Physically Abused: Not on file  . Sexually Abused: Not on file     Family History  Problem Relation Age of Onset  . Alzheimer's disease Mother   . Heart disease Brother      Review of Systems: A 12-system review of systems was performed and is negative except as noted in the HPI.  Labs: No results for input(s): CKTOTAL, CKMB, TROPONINI in the last 72 hours. Lab Results  Component Value Date   WBC 8.0 04/06/2019   HGB 12.8 (L) 04/06/2019   HCT 36.7 (L) 04/06/2019   MCV 94.3 04/06/2019   PLT 126 (L) 04/06/2019    Recent Labs  Lab 04/05/19 1953 04/06/19 0333  NA 136 137  K 4.1 3.5  CL 103 106  CO2 24 20*  BUN 21 20  CREATININE 1.37* 1.27*  CALCIUM 8.9 8.3*  PROT 6.7  --   BILITOT 0.8  --   ALKPHOS 124  --   ALT 61*  --   AST 87*  --   GLUCOSE 575* 513*   Lab Results  Component Value Date   CHOL 154 04/06/2019   HDL 31 (L) 04/06/2019   LDLCALC 101 (H) 04/06/2019   TRIG 109 04/06/2019   No results found for: DDIMER  Radiology/Studies:  DG Chest Portable 1 View  Result Date: 04/05/2019 CLINICAL DATA:  Atrial fibrillation and chest pain EXAM: PORTABLE CHEST 1 VIEW COMPARISON:  02/25/2018 FINDINGS: Cardiac shadow is enlarged but stable. Aortic calcifications are  again seen. Vascular congestion and interstitial edema is noted. No sizable effusion is seen. No focal infiltrate is noted. IMPRESSION: Changes consistent with congestive failure. Electronically Signed   By: Alcide Clever M.D.   On: 04/05/2019 23:42    Wt Readings from Last 3 Encounters:  04/05/19 70.3 kg  11/20/18 68 kg  05/01/18 67.2 kg    EKG: Initially wide-complex tachycardia now probable probable sinus bradycardia with type 1 second degree heart block  Physical Exam: No acute distress Blood pressure (!) 102/56, pulse (!) 51, temperature 98.2 F (36.8 C), temperature source Oral, resp. rate (!) 22, height 6\' 3"  (1.905 m), weight 70.3 kg, SpO2 96 %. Body mass index is 19.37 kg/m. General: Well developed, well nourished, in no acute distress. Head: Normocephalic, atraumatic, sclera non-icteric, no xanthomas, nares are without discharge.  Neck: Negative for carotid bruits. JVD not elevated. Lungs: Clear bilaterally to auscultation without wheezes, rales, or rhonchi. Breathing is unlabored. Heart: Irregular rhythm with bradycardia Abdomen: Soft, non-tender, non-distended with normoactive bowel sounds. No hepatomegaly. No rebound/guarding. No obvious abdominal masses. Msk:  Strength and tone appear normal for age. Extremities: No clubbing or cyanosis. No edema.  Distal pedal pulses are 2+ and equal bilaterally. Neuro: Alert and oriented X 3. No facial asymmetry. No focal deficit. Moves all extremities spontaneously. Psych:  Responds to questions appropriately with a normal affect.     Assessment and Plan  78 year old male with history of type 2 diabetes, tobacco abuse, peripheral vascular disease status post endovascular stent for AAA, history of atrial fibrillation treated with Eliquis as an outpatient, history of a probable cardiomyopathy with an EF of 25% by echo approximately 1 year ago who presented to the emergency room with complaints of chest heaviness.  He also had nausea.  He  had no vomiting diarrhea paresis or shortness of breath.  He  was noted to have a wide-complex tachycardia.  His systolic blood pressure via the emergency room showed a heart rate of the 170s with a systolic pressure of 102.  He was given adenosine although records from result of this are not currently available.  He had been given amiodarone bolus and currently is in sinus bradycardia versus Mobitz 1 second-degree heart block.  He is hemodynamically stable.  He is asymptomatic at present.  His troponin increased from 79-1552.  He is currently on heparin and stop his Eliquis which apparently was last taken Friday morning.  He also had hyperglycemia and acute on renal insufficiency with a creatinine of 1.37 up from 1.0.  Wide-complex tachycardia-apparent A. fib versus ventricular tachycardia.  Echocardiogram pending to reassess LV function.  Will temporarily hold IV amiodarone given bradycardia and continue with metoprolol.  Will review echocardiogram and discuss with electrophysiology.  Would continue to follow creatinine.  Will determine whether or not invasive versus noninvasive evaluation of his coronary anatomy is indicated.  Maintain magnesium greater than 2 potassium greater than 4.  Magnesium was 1.6 on admission.  Will supplement.  Elevated troponin-as per above.  May be demand ischemia due to tachycardia.  Will hold Eliquis and continue with heparin and terminated invasive evaluation depending on the patient's renal status, clinical status and echo report  Cardiomyopathy-EF 25% by echo a year ago.  Will review repeat echo determine if there is any interval change to guide further therapy.  Chest x-ray showed pulmonary edema.  Will carefully diurese following renal function and electrolytes.  Hyperglycemia-patient denies any change in his medications.  Will follow    Signed, Dalia HeadingKenneth A Eltha Tingley MD 04/06/2019, 8:01 AM Pager: [phone]

## 2019-04-06 NOTE — ED Notes (Signed)
PT cleaned up of incontinent urine and full bed change with new blankets. PT able to assist.

## 2019-04-06 NOTE — Progress Notes (Signed)
ANTICOAGULATION CONSULT NOTE - Initial Consult  Pharmacy Consult for Heparin Indication: chest pain/ACS  Allergies  Allergen Reactions  . Lipitor [Atorvastatin Calcium] Rash    Patient Measurements: Height: 6\' 3"  (190.5 cm) Weight: 155 lb (70.3 kg) IBW/kg (Calculated) : 84.5 HEPARIN DW (KG): 70.3  Vital Signs: BP: 105/74 (12/12 0015) Pulse Rate: 58 (12/12 0015)  Labs: Recent Labs    04/05/19 1953 04/05/19 2241  HGB 14.0  --   HCT 40.9  --   PLT 144*  --   CREATININE 1.37*  --   TROPONINIHS 92* 1,552*    Estimated Creatinine Clearance: 45.6 mL/min ([name]) (by C-G formula based on SCr of 1.37 mg/dL (H)).  Medical History: Past Medical History:  Diagnosis Date  . AAA (abdominal aortic aneurysm) ([place])   . Diabetes mellitus without complication ([place])     Medications:  (Not in [name] hospital admission)   Assessment: Pt is on Apixaban 5mg  BID PTA.  Baseline labs ordered.  Pharmacy asked to initiate and monitor Heparin for ACS.  Will use aPTT to manage Heparin.  Pt is on IV amiodarone.    Goal of Therapy:  aPTT 66-102 seconds Monitor platelets by anticoagulation protocol: Yes   Plan:  Heparin bolus 2000 units now x 1 (reduced due to h/o apixaban use) Heparin infusion at 800 units/hr Check aPTT 8 hours after start of Heparin  Kurt [name], Kurt [name] 04/06/2019,1:29 AM

## 2019-04-06 NOTE — Progress Notes (Signed)
Patient ID: Kurt [name], male   DOB: 10-05-1940, 78 y.o.   MRN: [id number] [facility] PROGRESS NOTE  PAOLA FLYNT CXK:[id number] DOB: 05-16-1940 DOA: 04/05/2019 PCP: Jaclyn Shaggy, MD  HPI/Subjective: Patient came in with chest pain shortness of breath and not feeling well.  Was found to have wide-complex tachycardia and given medications and started on amiodarone drip.  Patient's heart rate then became bradycardic and cardiology stopped the amiodarone drip.  Currently feeling better since his heart rate is not fast.  POA states that he felt better after sugars came down.  Objective: Vitals:   04/06/19 1300 04/06/19 1330  BP: 136/74 128/67  Pulse: 62 70  Resp: 18 18  Temp:    SpO2: 100% 98%    Intake/Output Summary (Last 24 hours) at 04/06/2019 1421 Last data filed at 04/06/2019 1246 Gross per 24 hour  Intake 50 ml  Output --  Net 50 ml   Filed Weights   04/05/19 2053  Weight: 70.3 kg    ROS: Review of Systems  Constitutional: Negative for chills and fever.  Eyes: Negative for blurred vision.  Respiratory: Positive for shortness of breath. Negative for cough.   Cardiovascular: Positive for chest pain.  Gastrointestinal: Negative for abdominal pain, constipation, diarrhea, nausea and vomiting.  Genitourinary: Negative for dysuria.  Musculoskeletal: Negative for joint pain.  Neurological: Negative for dizziness and headaches.   Exam: Physical Exam  Constitutional: He is oriented to person, place, and time.  HENT:  Nose: No mucosal edema.  Mouth/Throat: No oropharyngeal exudate or posterior oropharyngeal edema.  Eyes: Pupils are equal, round, and reactive to light. Conjunctivae, EOM and lids are normal.  Neck: No JVD present. Carotid bruit is not present. No thyroid mass and no thyromegaly present.  Cardiovascular: S1 normal and S2 normal. Bradycardia present. Exam reveals no gallop.  No murmur heard. Respiratory: No respiratory distress. He has decreased  breath sounds in the right lower field and the left lower field. He has no wheezes. He has no rhonchi. He has no rales.  GI: Soft. Bowel sounds are normal. There is no abdominal tenderness.  Musculoskeletal:     Cervical back: No edema.     Right ankle: No swelling.     Left ankle: No swelling.  Lymphadenopathy:    He has no cervical adenopathy.  Neurological: He is alert and oriented to person, place, and time. No cranial nerve deficit.  Skin: Skin is warm. No rash noted. Nails show no clubbing.  Psychiatric: He has a normal mood and affect.      Data Reviewed: Basic Metabolic Panel: Recent Labs  Lab 04/05/19 1953 04/05/19 2241 04/06/19 0333  NA 136  --  137  K 4.1  --  3.5  CL 103  --  106  CO2 24  --  20*  GLUCOSE 575*  --  513*  BUN 21  --  20  CREATININE 1.37*  --  1.27*  CALCIUM 8.9  --  8.3*  MG  --  1.6*  --    Liver Function Tests: Recent Labs  Lab 04/05/19 1953  AST 87*  ALT 61*  ALKPHOS 124  BILITOT 0.8  PROT 6.7  ALBUMIN 3.7   CBC: Recent Labs  Lab 04/05/19 1953 04/06/19 0333  WBC 8.6 8.0  NEUTROABS 6.6  --   HGB 14.0 12.8*  HCT 40.9 36.7*  MCV 97.4 94.3  PLT 144* 126*   BNP (last 3 results) Recent Labs    04/05/19 1953  BNP 195.0*   CBG: Recent Labs  Lab 04/06/19 0441 04/06/19 0554 04/06/19 0635 04/06/19 0843 04/06/19 1139  GLUCAP 279* 129* 108* 119* 140*    Recent Results (from the past 240 hour(s))  SARS CORONAVIRUS 2 (TAT 6-24 HRS) Nasopharyngeal Nasopharyngeal Swab     Status: None   Collection Time: 04/05/19 11:36 PM   Specimen: Nasopharyngeal Swab  Result Value Ref Range Status   SARS Coronavirus 2 NEGATIVE NEGATIVE Final    Comment: (NOTE) SARS-CoV-2 target nucleic acids are NOT DETECTED. The SARS-CoV-2 RNA is generally detectable in upper and lower respiratory specimens during the acute phase of infection. Negative results do not preclude SARS-CoV-2 infection, do not rule out co-infections with other pathogens,  and should not be used as the sole basis for treatment or other patient management decisions. Negative results must be combined with clinical observations, patient history, and epidemiological information. The expected result is Negative. Fact Sheet for Patients: HairSlick.nohttps://www.fda.gov/media/138098/download Fact Sheet for Healthcare Providers: quierodirigir.comhttps://www.fda.gov/media/138095/download This test is not yet approved or cleared by the [place] FDA and  has been authorized for detection and/or diagnosis of SARS-CoV-2 by FDA under an Emergency Use Authorization (EUA). This EUA will remain  in effect (meaning this test can be used) for the duration of the COVID-19 declaration under Section 56 4(b)(1) of the Act, 21 U.S.C. section 360bbb-3(b)(1), unless the authorization is terminated or revoked sooner. Performed at [facility], [address] [place], [place] [address]      Studies: DG Chest Portable 1 View  Result Date: 04/05/2019 CLINICAL DATA:  Atrial fibrillation and chest pain EXAM: PORTABLE CHEST 1 VIEW COMPARISON:  02/25/2018 FINDINGS: Cardiac shadow is enlarged but stable. Aortic calcifications are again seen. Vascular congestion and interstitial edema is noted. No sizable effusion is seen. No focal infiltrate is noted. IMPRESSION: Changes consistent with congestive failure. Electronically Signed   By: Alcide CleverMark  Lukens M.D.   On: 04/05/2019 23:42    Scheduled Meds: . aspirin EC  81 mg Oral Daily  . furosemide  20 mg Intravenous Once  . insulin aspart  0-5 Units Subcutaneous QHS  . insulin aspart  0-9 Units Subcutaneous TID WC  . insulin glargine  11 Units Subcutaneous Daily  . metoprolol succinate  25 mg Oral Daily   Continuous Infusions: . heparin 900 Units/hr (04/06/19 1234)    Assessment/Plan:  1. Wide-complex tachycardia.  Looks like the patient had 2 doses of adenosine and 3 pushes of Cardizem and an amiodarone drip in the emergency room.  Patient became  bradycardic so amiodarone drip was discontinued.  Cardiology did keep on the low-dose metoprolol.  Cardiology to consider cardiac catheterization.  With previous echocardiogram 20- 25% can consider defibrillator at some point but would let cardiology decide on this. 2. 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  Patient given a dose of Lasix in the emergency room.  Patient on low-dose metoprolol. 3. Rising troponin.  This could be demand ischemia with rapid heart rate.  Cardiology to decide on whether or not to do a cardiac catheterization on Monday.  Eliquis on hold and started on heparin. 4. Type 2 diabetes mellitus with hyperglycemia.  Patient was placed on insulin drip and switched over to glargine insulin and sliding scale.  Check a hemoglobin A1c. 5. AAA history  Code Status:     Code Status Orders  (From admission, onward)         Start     Ordered   04/06/19 0035  Full code  Continuous     04/06/19 0045        Code Status History    Date Active Date Inactive Code Status Order ID Comments User Context   02/28/2018 2049 03/01/2018 1612 Full Code [id number]  Epifanio Lesches, MD ED   02/17/2018 1554 02/23/2018 2009 Full Code [id number]  Fritzi Mandes, MD Inpatient   Advance Care Planning Activity    Advance Directive Documentation     Most Recent Value  Type of Advance Directive  Healthcare Power of Attorney, Living will  Pre-existing out of facility DNR order (yellow form or pink MOST form)  --  "MOST" Form in Place?  --     Family Communication: POA at the bedside Disposition Plan: To be determined  Consultants:  Cardiology  Time spent: 28 minutes  [place]

## 2019-04-06 NOTE — ED Notes (Signed)
Informed MD of cbg and implementation of glycemic control order set

## 2019-04-06 NOTE — H&P (Signed)
History and Physical    Kurt [name] ERX:[id number] DOB: [birth year] DOA: 04/05/2019  PCP: Jaclyn Shaggy, MD  Patient coming from: home I have personally briefly reviewed patient's old medical records in [facility]  Chief Complaint: chest pain, palpitations HPI: Kurt [name] is a 78 y.o. male with medical history significant for type 2 diabetes, nicotine dependence, peripheral vascular disease with AAA, status post endovascular stent as well as history of atrial fibrillation on Eliquis, followed by cardiologist, Dr. Juliann Pares, who presented to the emergency room with a complaint of sharp nonradiating pain in the left parasternal area associated with nausea.  He denied vomiting, diaphoresis or shortness of breath.  Most of the history taken by his caregiver at the bedside.  EMS reported wide-complex tachycardia.  Arrival in the emergency room he had a soft blood pressure of 102/85 and a heart rate in the 170s.  He was also tachypneic with respirations of 26.  Surgeon saturation was in the 90s. ED Course: In the emergency room he was administered IV diltiazem without significant improvement.  Dropped following diltiazem.ED provider consulted cardiologist Dr. Lady Gary who recommended amiodarone infusion.  Heart rate improved on amiodarone. Lab work-up showed significant abnormalities including a troponin of 1552, BNP of 195.  Sugar 575.  Creatinine 1.37  Review of Systems  Constitutional: Negative for chills, diaphoresis and fever.  HENT: Negative for congestion.   Respiratory: Positive for shortness of breath. Negative for cough and sputum production.   Cardiovascular: Positive for chest pain and palpitations. Negative for orthopnea, leg swelling and PND.  Gastrointestinal: Positive for nausea. Negative for abdominal pain and vomiting.  Genitourinary: Negative for dysuria.  Skin: Negative for itching and rash.  Neurological: Negative for dizziness, focal weakness and headaches.    Psychiatric/Behavioral: Negative for substance abuse. The patient is not nervous/anxious.    .   Past Medical History:  Diagnosis Date  . AAA (abdominal aortic aneurysm) (HCC)   . Diabetes mellitus without complication [facility])     Past Surgical History:  Procedure Laterality Date  . ABDOMINAL AORTIC ANEURYSM REPAIR    . arm surgery Left   . EMBOLECTOMY Right 02/17/2018   Procedure: FOGERTY EMBOLECTOMY;  Surgeon: Annice Needy, MD;  Location: [facility] ORS;  Service: Vascular;  Laterality: Right;  . FEMORAL-FEMORAL BYPASS GRAFT Right 02/17/2018   Procedure: BYPASS FEMORAL-FEMORAL ARTERY;  Surgeon: Annice Needy, MD;  Location: [facility] ORS;  Service: Vascular;  Laterality: Right;  . THROMBECTOMY FEMORAL ARTERY Right 02/17/2018   Procedure: THROMBECTOMY FEMORAL ARTERY;  Surgeon: Annice Needy, MD;  Location: [facility] ORS;  Service: Vascular;  Laterality: Right;     reports that he has been smoking. He uses smokeless tobacco. He reports previous alcohol use. He reports that he does not use drugs.  Allergies  Allergen Reactions  . Lipitor [Atorvastatin Calcium] Rash    Family History  Problem Relation Age of Onset  . Alzheimer's disease Mother   . Heart disease Brother      Prior to Admission medications   Medication Sig Start Date End Date Taking? Authorizing Provider  apixaban (ELIQUIS) 5 MG TABS tablet Take 1 tablet (5 mg total) by mouth 2 (two) times daily. 03/06/18   Sharee Holster, NP  Cholecalciferol (D-5000) 5000 units TABS Take 5,000 Units by mouth daily.     [provider]  clopidogrel (PLAVIX) 75 MG tablet Take 1 tablet (75 mg total) by mouth daily. 03/06/18   Sharee Holster, NP  co-enzyme Q-10  30 MG capsule Take 30 mg by mouth 3 (three) times daily.    [provider]  docusate sodium (COLACE) 100 MG capsule Take 100 mg by mouth 2 (two) times daily.    [provider]  donepezil (ARICEPT) 5 MG tablet Take 1 tablet (5 mg total) by mouth at bedtime.  03/06/18   Sharee HolsterGreen, Deborah S, NP  Garlic Oil 1000 MG CAPS Take 1,000 mg by mouth daily as needed (cholesterol).     [provider]  glimepiride (AMARYL) 2 MG tablet Take 1 tablet (2 mg total) by mouth daily with breakfast. 03/06/18   Sharee HolsterGreen, Deborah S, NP  metFORMIN (GLUCOPHAGE-XR) 500 MG 24 hr tablet Take 1 tablet (500 mg total) by mouth daily with breakfast. Patient taking differently: Take 500 mg by mouth daily with breakfast.  03/06/18   Sharee HolsterGreen, Deborah S, NP  metoprolol succinate (TOPROL-XL) 25 MG 24 hr tablet Take 1 tablet (25 mg total) by mouth daily. 03/06/18   Sharee HolsterGreen, Deborah S, NP  Misc Natural Products (PROSTATE THERAPY COMPLEX PO) Take 562 mg by mouth daily.     [provider]  Red Yeast Rice 600 MG CAPS Take 1 capsule by mouth daily.     [provider]  traMADol (ULTRAM) 50 MG tablet Take 1 tablet (50 mg total) by mouth every 6 (six) hours as needed for moderate pain. Patient not taking: Reported on 11/20/2018 03/06/18   Sharee HolsterGreen, Deborah S, NP  vitamin B-12 (CYANOCOBALAMIN) 1000 MCG tablet Take 1,000 mcg by mouth daily.     [provider]    Physical Exam: Vitals:   04/05/19 2345 04/06/19 0015 04/06/19 0115 04/06/19 0145  BP: 103/70 105/74 115/68 102/62  Pulse: (!) 53 (!) 58 (!) 25 (!) 55  Resp: 20 (!) 22 (!) 21   SpO2: 97% 98% 100% 99%  Weight:      Height:        Constitutional: NAD, calm, comfortable Vitals:   04/05/19 2345 04/06/19 0015 04/06/19 0115 04/06/19 0145  BP: 103/70 105/74 115/68 102/62  Pulse: (!) 53 (!) 58 (!) 25 (!) 55  Resp: 20 (!) 22 (!) 21   SpO2: 97% 98% 100% 99%  Weight:      Height:       Eyes: PERRL, lids and conjunctivae normal ENMT: Mucous membranes are moist. Posterior pharynx clear of any exudate or lesions.Normal dentition.  Neck: normal, supple, no masses, no thyromegaly Respiratory: clear to auscultation bilaterally, no wheezing, no crackles. Normal respiratory effort. No accessory muscle use.    Cardiovascular:irreglar, tachycardic Abdomen: no tenderness, no masses palpated. No hepatosplenomegaly. Bowel sounds positive.  Musculoskeletal: no clubbing / cyanosis. No joint deformity upper and lower extremities. Good ROM, no contractures. Normal muscle tone.  Skin: no rashes, lesions, ulcers. No induration Neurologic: CN 2-12 grossly intact. Sensation intact, DTR normal. Strength 5/5 in all 4.  Psychiatric: Normal judgment and insight. Alert and oriented x 3. Normal mood.   Labs on Admission: I have personally reviewed following labs and imaging studies   CBC: Recent Labs  Lab 04/05/19 1953  WBC 8.6  NEUTROABS 6.6  HGB 14.0  HCT 40.9  MCV 97.4  PLT 144*   Basic Metabolic Panel: Recent Labs  Lab 04/05/19 1953  NA 136  K 4.1  CL 103  CO2 24  GLUCOSE 575*  BUN 21  CREATININE 1.37*  CALCIUM 8.9   GFR: Estimated Creatinine Clearance: 45.6 mL/min (A) (by C-G formula based on SCr of 1.37 mg/dL (H)).  Liver Function Tests: Recent Labs  Lab 04/05/19 1953  AST 87*  ALT 61*  ALKPHOS 124  BILITOT 0.8  PROT 6.7  ALBUMIN 3.7   No results for input(s): LIPASE, AMYLASE in the last 168 hours. No results for input(s): AMMONIA in the last 168 hours. Coagulation Profile: No results for input(s): INR, PROTIME in the last 168 hours. Cardiac Enzymes: No results for input(s): CKTOTAL, CKMB, CKMBINDEX, TROPONINI in the last 168 hours. BNP (last 3 results) No results for input(s): PROBNP in the last 8760 hours. HbA1C: No results for input(s): HGBA1C in the last 72 hours. CBG: Recent Labs  Lab 04/05/19 2238  GLUCAP 418*   Lipid Profile: No results for input(s): CHOL, HDL, LDLCALC, TRIG, CHOLHDL, LDLDIRECT in the last 72 hours. Thyroid Function Tests: No results for input(s): TSH, T4TOTAL, FREET4, T3FREE, THYROIDAB in the last 72 hours. Anemia Panel: No results for input(s): VITAMINB12, FOLATE, FERRITIN, TIBC, IRON, RETICCTPCT in the last 72 hours. Urine analysis:     Component Value Date/Time   COLORURINE YELLOW (A) 02/28/2018 1624   APPEARANCEUR CLEAR (A) 02/28/2018 1624   LABSPEC 1.015 02/28/2018 1624   PHURINE 6.0 02/28/2018 1624   GLUCOSEU NEGATIVE 02/28/2018 1624   HGBUR NEGATIVE 02/28/2018 1624   BILIRUBINUR NEGATIVE 02/28/2018 1624   KETONESUR NEGATIVE 02/28/2018 1624   PROTEINUR NEGATIVE 02/28/2018 1624   NITRITE NEGATIVE 02/28/2018 1624   LEUKOCYTESUR NEGATIVE 02/28/2018 1624    Radiological Exams on Admission: DG Chest Portable 1 View  Result Date: 04/05/2019 CLINICAL DATA:  Atrial fibrillation and chest pain EXAM: PORTABLE CHEST 1 VIEW COMPARISON:  02/25/2018 FINDINGS: Cardiac shadow is enlarged but stable. Aortic calcifications are again seen. Vascular congestion and interstitial edema is noted. No sizable effusion is seen. No focal infiltrate is noted. IMPRESSION: Changes consistent with congestive failure. Electronically Signed   By: Inez Catalina M.D.   On: 04/05/2019 23:42     EKG: Independently reviewed.   Assessment/Plan    Rapid atrial fibrillation (HCC) --Continue amiodarone infusion.  Patient was previously given IV diltiazem without significant improvement --Patient usually on Eliquis but this is being held as he will be on an insulin infusion for possible NSTEMI  Acute on chronic diastolic CHF (congestive heart failure) (HCC) --Likely mild failure.  Cardiogram in 2019 October showed EF --Get echocardiogram --Due to borderline hypotension will hold diuretics, ACE inhibitors and beta-blockers    NSTEMI (non-ST elevated myocardial infarction) ([place]) --Troponin is elevated at 92 with second set bumping to 1552, all possibly related to demand ischemia the above --Infusion started/ Eliquis on hold --continue to cycle enzymes --cardiology consult   Hyperglycemia due to type 2 diabetes mellitus ([place]) --insulin coverage --A1c        Athena Masse MD [facility]   If 7PM-7AM, please contact  night-coverage [email or web address] Password [email or web address]  04/06/2019, 2:07 AM

## 2019-04-06 NOTE — ED Notes (Signed)
PT given water.

## 2019-04-06 NOTE — ED Notes (Signed)
Heparin verified by April Brumgard, RN

## 2019-04-06 NOTE — ED Notes (Signed)
Entered room, found pt having pulled off all monitoring equipment, Zoll pads, and LAC PIV running heparin. Heparin infusion changed to RFA site and wrapped with ACE wrap. Monitoring equipment replaced. Will leave Zoll pads off at this time. Noted pt's brief wet, brief and chux pad changed. Pt given new gown. Education provided not to remove monitoring equipment.

## 2019-04-06 NOTE — ED Notes (Signed)
Pt trying to get out of bed. This tech and Elmo Putt, RN redirected and repositioned pt in bed. This tech 1:1 sitting with pt at this time.

## 2019-04-07 DIAGNOSIS — I482 Chronic atrial fibrillation, unspecified: Secondary | ICD-10-CM

## 2019-04-07 DIAGNOSIS — L89152 Pressure ulcer of sacral region, stage 2: Secondary | ICD-10-CM

## 2019-04-07 LAB — BASIC METABOLIC PANEL
Anion gap: 7 (ref 5–15)
BUN: 18 mg/dL (ref 8–23)
CO2: 21 mmol/L — ABNORMAL LOW (ref 22–32)
Calcium: 8.5 mg/dL — ABNORMAL LOW (ref 8.9–10.3)
Chloride: 111 mmol/L (ref 98–111)
Creatinine, Ser: 1.06 mg/dL (ref 0.61–1.24)
GFR calc Af Amer: >60 mL/min (ref >60)
GFR calc non Af Amer: >60 mL/min (ref >60)
Glucose, Bld: 159 mg/dL — ABNORMAL HIGH (ref 70–99)
Potassium: 3.6 mmol/L (ref 3.5–5.1)
Sodium: 139 mmol/L (ref 135–145)

## 2019-04-07 LAB — CBC
HCT: 35.2 % — ABNORMAL LOW (ref 39.0–52.0)
Hemoglobin: 11.9 g/dL — ABNORMAL LOW (ref 13.0–17.0)
MCH: 32.3 pg (ref 26.0–34.0)
MCHC: 33.8 g/dL (ref 30.0–36.0)
MCV: 95.7 fL (ref 80.0–100.0)
Platelets: 118 10*3/uL — ABNORMAL LOW (ref 150–400)
RBC: 3.68 MIL/uL — ABNORMAL LOW (ref 4.22–5.81)
RDW: 13.4 % (ref 11.5–15.5)
WBC: 8.5 10*3/uL (ref 4.0–10.5)
nRBC: 0 % (ref 0.0–0.2)

## 2019-04-07 LAB — TROPONIN I (HIGH SENSITIVITY)
Troponin I (High Sensitivity): 12437 ng/L (ref <18)
Troponin I (High Sensitivity): 11757 ng/L (ref <18)

## 2019-04-07 LAB — APTT: aPTT: 76 seconds — ABNORMAL HIGH (ref 24–36)

## 2019-04-07 LAB — BRAIN NATRIURETIC PEPTIDE: B Natriuretic Peptide: 1131 pg/mL — ABNORMAL HIGH (ref 0.0–100.0)

## 2019-04-07 LAB — GLUCOSE, CAPILLARY
Glucose-Capillary: 151 mg/dL — ABNORMAL HIGH (ref 70–99)
Glucose-Capillary: 222 mg/dL — ABNORMAL HIGH (ref 70–99)
Glucose-Capillary: 83 mg/dL (ref 70–99)
Glucose-Capillary: 157 mg/dL — ABNORMAL HIGH (ref 70–99)

## 2019-04-07 LAB — HEPARIN LEVEL (UNFRACTIONATED): Heparin Unfractionated: 0.37 IU/mL (ref 0.30–0.70)

## 2019-04-07 MED ORDER — LISINOPRIL 5 MG PO TABS
5.0000 mg | ORAL_TABLET | Freq: Every day | ORAL | Status: DC
  Administered 2019-04-07 – 2019-04-12 (×6): 5 mg via ORAL
  Filled 2019-04-07 (×6): qty 1

## 2019-04-07 MED ORDER — SPIRONOLACTONE 25 MG PO TABS
12.5000 mg | ORAL_TABLET | Freq: Every day | ORAL | Status: DC
  Administered 2019-04-07 – 2019-04-12 (×5): 12.5 mg via ORAL
  Filled 2019-04-07: qty 1
  Filled 2019-04-07 (×4): qty 0.5
  Filled 2019-04-07 (×3): qty 1
  Filled 2019-04-07 (×2): qty 0.5

## 2019-04-07 MED ORDER — METOPROLOL SUCCINATE ER 25 MG PO TB24
12.5000 mg | ORAL_TABLET | Freq: Every day | ORAL | Status: DC
  Administered 2019-04-07: 12.5 mg via ORAL
  Filled 2019-04-07 (×4): qty 1

## 2019-04-07 MED ORDER — FUROSEMIDE 10 MG/ML IJ SOLN: 20.0000 mg | Freq: Two times a day (BID) | INTRAMUSCULAR | Status: DC

## 2019-04-07 NOTE — Progress Notes (Signed)
Patient ID: Kurt [name], male   DOB: 1940/07/13, 78 y.o.   MRN: [id number] [facility] PROGRESS NOTE  Kurt [name] UVO:[id number] DOB: 1940/07/13 DOA: 04/05/2019 PCP: Kurt Shaggyate, Denny C, MD  HPI/Subjective: Patient seen this morning and he felt okay.  He offers no complaints.  No complaints of shortness of breath or chest pain.  He was coughing.  He is stated he is a smoker.  He does not want a nicotine patch.  Objective: Vitals:   04/07/19 0345 04/07/19 0734  BP: (!) 143/81 129/85  Pulse: (!) 52 (!) 53  Resp: 14 18  Temp: 98.7 F (37.1 [name]) 98.5 F (36.9 [name])  SpO2: 98% 97%    Intake/Output Summary (Last 24 hours) at 04/07/2019 1422 Last data filed at 04/07/2019 1300 Gross per 24 hour  Intake 480 ml  Output 1200 ml  Net -720 ml   Filed Weights   04/05/19 2053 04/06/19 2050 04/07/19 0345  Weight: 70.3 kg 67.4 kg 67.7 kg    ROS: Review of Systems  Constitutional: Negative for chills and fever.  Eyes: Negative for blurred vision.  Respiratory: Positive for cough. Negative for shortness of breath.   Cardiovascular: Negative for chest pain.  Gastrointestinal: Negative for abdominal pain, constipation, diarrhea, nausea and vomiting.  Genitourinary: Negative for dysuria.  Musculoskeletal: Negative for joint pain.  Neurological: Negative for dizziness and headaches.   Exam: Physical Exam  Constitutional: He is oriented to person, place, and time.  HENT:  Nose: No mucosal edema.  Mouth/Throat: No oropharyngeal exudate or posterior oropharyngeal edema.  Eyes: Pupils are equal, round, and reactive to light. Conjunctivae, EOM and lids are normal.  Neck: No JVD present. Carotid bruit is not present. No thyroid mass and no thyromegaly present.  Cardiovascular: S1 normal and S2 normal. Bradycardia present. Exam reveals no gallop.  No murmur heard. Respiratory: No respiratory distress. He has decreased breath sounds in the right lower field and the left lower field. He has no  wheezes. He has no rhonchi. He has no rales.  GI: Soft. Bowel sounds are normal. There is no abdominal tenderness.  Musculoskeletal:     Cervical back: No edema.     Right ankle: No swelling.     Left ankle: No swelling.  Lymphadenopathy:    He has no cervical adenopathy.  Neurological: He is alert and oriented to person, place, and time. No cranial nerve deficit.  Skin: Skin is warm. Nails show no clubbing.  Stage II decubitus ulcer.  Psychiatric: He has a normal mood and affect.      Data Reviewed: Basic Metabolic Panel: Recent Labs  Lab 04/05/19 1953 04/05/19 2241 04/06/19 0333 04/07/19 0534  NA 136  --  137 139  K 4.1  --  3.5 3.6  CL 103  --  106 111  CO2 24  --  20* 21*  GLUCOSE 575*  --  513* 159*  BUN 21  --  20 18  CREATININE 1.37*  --  1.27* 1.06  CALCIUM 8.9  --  8.3* 8.5*  MG  --  1.6*  --   --    Liver Function Tests: Recent Labs  Lab 04/05/19 1953  AST 87*  ALT 61*  ALKPHOS 124  BILITOT 0.8  PROT 6.7  ALBUMIN 3.7   CBC: Recent Labs  Lab 04/05/19 1953 04/06/19 0333 04/07/19 0534  WBC 8.6 8.0 8.5  NEUTROABS 6.6  --   --   HGB 14.0 12.8* 11.9*  HCT 40.9 36.7* 35.2*  MCV 97.4 94.3 95.7  PLT 144* 126* 118*   BNP (last 3 results) Recent Labs    04/05/19 1953 04/07/19 0931  BNP 195.0* 1,131.0*   CBG: Recent Labs  Lab 04/06/19 1713 04/06/19 1813 04/06/19 2100 04/07/19 0735 04/07/19 1137  GLUCAP 146* 149* 205* 151* 222*    Recent Results (from the past 240 hour(s))  SARS CORONAVIRUS 2 (TAT 6-24 HRS) Nasopharyngeal Nasopharyngeal Swab     Status: None   Collection Time: 04/05/19 11:36 PM   Specimen: Nasopharyngeal Swab  Result Value Ref Range Status   SARS Coronavirus 2 NEGATIVE NEGATIVE Final    Comment: (NOTE) SARS-CoV-2 target nucleic acids are NOT DETECTED. The SARS-CoV-2 RNA is generally detectable in upper and lower respiratory specimens during the acute phase of infection. Negative results do not preclude SARS-CoV-2  infection, do not rule out co-infections with other pathogens, and should not be used as the sole basis for treatment or other patient management decisions. Negative results must be combined with clinical observations, patient history, and epidemiological information. The expected result is Negative. Fact Sheet for Patients: HairSlick.no Fact Sheet for Healthcare Providers: quierodirigir.com This test is not yet approved or cleared by the [place] FDA and  has been authorized for detection and/or diagnosis of SARS-CoV-2 by FDA under an Emergency Use Authorization (EUA). This EUA will remain  in effect (meaning this test can be used) for the duration of the COVID-19 declaration under Section 56 4(b)(1) of the Act, [name] section 360bbb-3(b)(1), unless the authorization is terminated or revoked sooner. Performed at [facility], [address] [place], [place] [address]      Studies: DG Chest Portable 1 View  Result Date: 04/05/2019 CLINICAL DATA:  Atrial fibrillation and chest pain EXAM: PORTABLE CHEST 1 VIEW COMPARISON:  02/25/2018 FINDINGS: Cardiac shadow is enlarged but stable. Aortic calcifications are again seen. Vascular congestion and interstitial edema is noted. No sizable effusion is seen. No focal infiltrate is noted. IMPRESSION: Changes consistent with congestive failure. Electronically Signed   By: Kurt [name] M.D.   On: 04/05/2019 23:42   ECHOCARDIOGRAM COMPLETE  Result Date: 04/06/2019   ECHOCARDIOGRAM REPORT   Patient Name:   Kurt [name] Date of Exam: 04/06/2019 Medical Rec #:  [id number]      Height:       75.0 in Accession #:    [id number]     Weight:       155.0 lb Date of Birth:  1940/08/13      BSA:          1.97 m Patient Age:    78 years       BP:           110/59 mmHg Patient Gender: M              HR:           47 bpm. Exam Location:  [facility] Procedure: 2D Echo and Intracardiac Opacification  Agent Indications:     NSTEMI  History:         Patient has prior history of Echocardiogram examinations. CHF,                  Arrythmias:Atrial Fibrillation; Risk Factors:Diabetes and                  Current Smoker.  Sonographer:     LTM Referring Phys:  [id number] Kurt [name] Diagnosing Phys: Harold Hedge MD IMPRESSIONS  1. Left ventricular  ejection fraction, by visual estimation, is 20 to 25%. The left ventricle has severely decreased function. Left ventricular septal wall thickness was normal. Normal left ventricular posterior wall thickness. There is no left ventricular hypertrophy.  2. Definity contrast agent was given IV to delineate the left ventricular endocardial borders.  3. Mildly dilated left ventricular internal cavity size.  4. The left ventricle demonstrates global hypokinesis.  5. Global right ventricle has normal systolic function.The right ventricular size is normal. No increase in right ventricular wall thickness.  6. Left atrial size was normal.  7. Right atrial size was mildly dilated.  8. The mitral valve is grossly normal. Mild to moderate mitral valve regurgitation. No evidence of mitral stenosis.  9. The tricuspid valve is not well visualized. Tricuspid valve regurgitation is trivial. 10. The aortic valve was not well visualized. Aortic valve regurgitation is trivial. 11. The pulmonic valve was not well visualized. Pulmonic valve regurgitation is trivial. 12. Mildly elevated pulmonary artery systolic pressure. 13. The atrial septum is grossly normal. FINDINGS  Left Ventricle: Left ventricular ejection fraction, by visual estimation, is 20 to 25%. The left ventricle has severely decreased function. Definity contrast agent was given IV to delineate the left ventricular endocardial borders. The left ventricle demonstrates global hypokinesis. The left ventricular internal cavity size was mildly dilated left ventricle. Normal left ventricular posterior wall thickness. There is no left  ventricular hypertrophy. Right Ventricle: The right ventricular size is normal. No increase in right ventricular wall thickness. Global RV systolic function is has normal systolic function. The tricuspid regurgitant velocity is 2.49 m/s, and with an assumed right atrial pressure  of 10 mmHg, the estimated right ventricular systolic pressure is mildly elevated at 34.8 mmHg. Left Atrium: Left atrial size was normal in size. Right Atrium: Right atrial size was mildly dilated Pericardium: There is no evidence of pericardial effusion. Mitral Valve: The mitral valve is grossly normal. Mild to moderate mitral valve regurgitation. No evidence of mitral valve stenosis by observation. Tricuspid Valve: The tricuspid valve is not well visualized. Tricuspid valve regurgitation is trivial. Aortic Valve: The aortic valve was not well visualized. Aortic valve regurgitation is trivial. Aortic valve mean gradient measures 3.0 mmHg. Aortic valve peak gradient measures 5.0 mmHg. Aortic valve area, by VTI measures 1.37 cm. Pulmonic Valve: The pulmonic valve was not well visualized. Pulmonic valve regurgitation is trivial. Pulmonic regurgitation is trivial. Aorta: The aortic root is normal in size and structure. IAS/Shunts: The atrial septum is grossly normal.  LEFT VENTRICLE PLAX 2D LVIDd:         4.70 cm       Diastology LVIDs:         4.34 cm       LV e' lateral:   4.62 cm/s LV PW:         0.75 cm       LV E/e' lateral: 18.1 LV IVS:        1.27 cm       LV e' medial:    4.13 cm/s LVOT diam:     1.70 cm       LV E/e' medial:  20.2 LV SV:         17 ml LV SV Index:   9.12 LVOT Area:     2.27 cm  LV Volumes (MOD) LV area d, A2C:    32.90 cm LV area d, A4C:    36.90 cm LV area s, A2C:    26.70 cm LV area s,  A4C:    26.00 cm LV major d, A2C:   8.08 cm LV major d, A4C:   8.42 cm LV major s, A2C:   7.58 cm LV major s, A4C:   7.21 cm LV vol d, MOD A2C: 110.0 ml LV vol d, MOD A4C: 132.0 ml LV vol s, MOD A2C: 76.0 ml LV vol s, MOD A4C:  75.1 ml LV SV MOD A2C:     34.0 ml LV SV MOD A4C:     132.0 ml LV SV MOD BP:      45.5 ml RIGHT VENTRICLE RV S prime:     8.92 cm/s TAPSE (M-mode): 2.4 cm LEFT ATRIUM             Index LA diam:        1.70 cm 0.86 cm/m LA Vol (A2C):   58.7 ml 29.81 ml/m LA Vol (A4C):   42.3 ml 21.48 ml/m LA Biplane Vol: 50.8 ml 25.80 ml/m  AORTIC VALVE AV Area (Vmax):    1.29 cm AV Area (Vmean):   1.09 cm AV Area (VTI):     1.37 cm AV Vmax:           112.00 cm/s AV Vmean:          80.500 cm/s AV VTI:            0.236 m AV Peak Grad:      5.0 mmHg AV Mean Grad:      3.0 mmHg LVOT Vmax:         63.50 cm/s LVOT Vmean:        38.800 cm/s LVOT VTI:          0.142 m LVOT/AV VTI ratio: 0.60  AORTA Ao Root diam: 3.60 cm MITRAL VALVE                         TRICUSPID VALVE MV Area (PHT): 2.34 cm              TR Peak grad:   24.8 mmHg MV PHT:        94.00 msec            TR Vmax:        284.00 cm/s MV E velocity: 83.40 cm/s  103 cm/s MV A velocity: 130.00 cm/s 70.3 cm/s SHUNTS MV E/A ratio:  0.64        1.5       Systemic VTI:  0.14 m                                      Systemic Diam: 1.70 cm  Harold Hedge MD Electronically signed by Harold Hedge MD Signature Date/Time: 04/06/2019/2:25:05 PM    Final     Scheduled Meds: . aspirin EC  81 mg Oral Daily  . docusate sodium  100 mg Oral BID  . donepezil  5 mg Oral Daily  . furosemide  20 mg Intravenous Once  . furosemide  20 mg Intravenous Daily  . insulin aspart  0-5 Units Subcutaneous QHS  . insulin aspart  0-9 Units Subcutaneous TID WC  . insulin glargine  11 Units Subcutaneous Daily  . lisinopril  5 mg Oral Daily  . metoprolol succinate  12.5 mg Oral Daily  . spironolactone  12.5 mg Oral Daily   Continuous Infusions: . heparin 900 Units/hr (04/07/19 0524)  Assessment/Plan:  1. Wide-complex tachycardia on presentation and now in atrial fibrillation.  Patient on low-dose beta-blocker.  On heparin drip for now.  Once a decision for cardiac catheterization or not  made can go back on Eliquis. 2. 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  Patient has an ejection fraction of 20 to 25%.  Cardiology to decide on whether or not defibrillator needed.  Continue diuresis with low-dose IV Lasix twice daily.  Patient on low-dose metoprolol.  Spironolactone and lisinopril added. 3. Rising troponin.  This could be demand ischemia with rapid heart rate.  Cardiology to decide on whether or not to do a cardiac catheterization on this hospitalization.  Eliquis on hold and started on heparin. 4. Type 2 diabetes mellitus with hyperglycemia.  Patient was placed on insulin drip and switched over to glargine insulin and sliding scale.  Patient's hemoglobin A1c is 7.8.  May be a candidate to go back on oral medications as outpatient. 5. AAA history 6. Stage II decubitus ulcer. 7. Weakness.  Physical therapy evaluation  Code Status:     Code Status Orders  (From admission, onward)         Start     Ordered   04/06/19 0035  Full code  Continuous     04/06/19 0045        Code Status History    Date Active Date Inactive Code Status Order ID Comments User Context   02/28/2018 2049 03/01/2018 1612 Full Code [id number]  Epifanio Lesches, MD ED   02/17/2018 1554 02/23/2018 2009 Full Code [id number]  Fritzi Mandes, MD Inpatient   Advance Care Planning Activity    Advance Directive Documentation     Most Recent Value  Type of Advance Directive  Healthcare Power of Attorney, Living will  Pre-existing out of facility DNR order (yellow form or pink MOST form)  --  "MOST" Form in Place?  --     Family Communication: POA on the phone. Disposition Plan: To be determined  Consultants:  Cardiology  Time spent: 28 minutes  [place]

## 2019-04-07 NOTE — Progress Notes (Signed)
ANTICOAGULATION CONSULT NOTE  Pharmacy Consult for Heparin Indication: chest pain/ACS  Allergies  Allergen Reactions  . Lipitor [Atorvastatin Calcium] Rash   Patient Measurements: Height: 6\' 3"  (190.5 cm) Weight: 149 lb 3.2 oz (67.7 kg) IBW/kg (Calculated) : 84.5 HEPARIN DW (KG): 67.4  Vital Signs: Temp: 98.7 F (37.1 C) (12/13 0345) Temp Source: Oral (12/13 0345) BP: 143/81 (12/13 0345) Pulse Rate: 52 (12/13 0345)  Labs: Recent Labs    04/05/19 1953 04/05/19 1953 04/05/19 2241 04/06/19 0333 04/06/19 1144 04/06/19 2124 04/07/19 0534  HGB 14.0  --   --  12.8*  --   --  11.9*  HCT 40.9  --   --  36.7*  --   --  35.2*  PLT 144*  --   --  126*  --   --  118*  APTT  --    < >  --  79* 62* 70* 76*  LABPROT  --   --   --  15.1  --   --   --   INR  --   --   --  1.2  --   --   --   HEPARINUNFRC  --   --   --   --   --   --  0.37  CREATININE 1.37*  --   --  1.27*  --   --  1.06  TROPONINIHS 92*  --  1,552*  --   --   --   --    < > = values in this interval not displayed.    Estimated Creatinine Clearance: 56.8 mL/min (by C-G formula based on SCr of 1.06 mg/dL).  Medical History: Past Medical History:  Diagnosis Date  . AAA (abdominal aortic aneurysm) (HCC)   . Diabetes mellitus without complication (HCC)     Medications:  Medications Prior to Admission  Medication Sig Dispense Refill Last Dose  . apixaban (ELIQUIS) 5 MG TABS tablet Take 1 tablet (5 mg total) by mouth 2 (two) times daily. 60 tablet 0 48+ hours at Unknown  . Cholecalciferol (D-5000) 5000 units TABS Take 5,000 Units by mouth daily.      . clopidogrel (PLAVIX) 75 MG tablet Take 1 tablet (75 mg total) by mouth daily. 30 tablet 0 48+ hours at Unknown  . co-enzyme Q-10 30 MG capsule Take 30 mg by mouth 3 (three) times daily.     04/09/19 docusate sodium (COLACE) 100 MG capsule Take 100 mg by mouth 2 (two) times daily.     Marland Kitchen donepezil (ARICEPT) 5 MG tablet Take 1 tablet (5 mg total) by mouth at bedtime. 30  tablet 0 48+ hours at Unknown  . Garlic Oil 1000 MG CAPS Take 1,000 mg by mouth daily as needed (cholesterol).    Unknown at PRN  . glimepiride (AMARYL) 2 MG tablet Take 2 mg by mouth 2 (two) times daily with a meal.   48+ hours at Unknown  . lisinopril (ZESTRIL) 2.5 MG tablet Take 2.5 mg by mouth daily.   48+ hours at Unknown  . metFORMIN (GLUCOPHAGE) 500 MG tablet Take 1,000 mg by mouth 2 (two) times daily with a meal.   48+ hours at Unknown  . metoprolol succinate (TOPROL-XL) 50 MG 24 hr tablet Take 25 mg by mouth daily. Take with or immediately following a meal.     . Misc Natural Products (PROSTATE THERAPY COMPLEX PO) Take 562 mg by mouth daily.      . Red Yeast Rice 600 MG  CAPS Take 1 capsule by mouth daily.      . vitamin B-12 (CYANOCOBALAMIN) 1000 MCG tablet Take 1,000 mcg by mouth daily.        Assessment: Pt is on Apixaban 5mg  BID PTA.  Baseline labs ordered.  Pharmacy asked to initiate and monitor Heparin for ACS.  Will use aPTT to manage Heparin.  Pt is on IV amiodarone.    12/12 1144 APTT 62 - rate increased to 900 units/hr  Goal of Therapy:  aPTT 66-102 seconds Monitor platelets by anticoagulation protocol: Yes   Plan:  12/12 2124 APTT 70 therapeutic. Will continue heparin drip @ 900 units/hr.  12/13 0534 HL 0.37, aPTT 76, therapeutic.  HL and aPTT correlate.  Continue current rate, will now use HL to manage Heparin.  CBC slightly worse.  CBC/HL with am labs   Ena Dawley, PharmD Clinical Pharmacist 04/07/2019 6:31 AM

## 2019-04-07 NOTE — Progress Notes (Addendum)
Patient Name: Kurt [name] Date of Encounter: 04/07/2019  Hospital Problem List     Principal Problem:   Wide-complex tachycardia [facility]) Active Problems:   Tobacco dependence   Abdominal aortic aneurysm without rupture (HCC)   PVD (peripheral vascular disease) (HCC)   Rapid atrial fibrillation (HCC)   RBBB   Hyperglycemia due to type 2 diabetes mellitus (HCC)   Acute on chronic diastolic CHF (congestive heart failure) (HCC)   NSTEMI (non-ST elevated myocardial infarction) (HCC)   Atrial flutter with rapid ventricular response [facility])    Patient Profile     78 year old male with history of diabetes, tobacco abuse, peripheral vascular disease, atrial fibrillation admitted with wide-complex tachycardia. He also has a known cardiomyopathy with ejection fraction of 20 to 25%. Etiology of this is as of yet unclear. He presented with his wide-complex tachycardia that was relatively hemodynamically stable. He was given amiodarone followed by adenosine. He is converted to a sinus rhythm with intermittent Mobitz 1 heart block. There have been no prolonged pauses. No recurrent sustained arrhythmias. Has occasional disease. He is a very difficult year historian. He denies chest pain or shortness of breath. Troponin eschar 1559.  Subjective   Denies any complaints.  Inpatient Medications    . aspirin EC  81 mg Oral Daily  . docusate sodium  100 mg Oral BID  . donepezil  5 mg Oral Daily  . furosemide  20 mg Intravenous Once  . furosemide  20 mg Intravenous Daily  . insulin aspart  0-5 Units Subcutaneous QHS  . insulin aspart  0-9 Units Subcutaneous TID WC  . insulin glargine  11 Units Subcutaneous Daily  . lisinopril  5 mg Oral Daily  . metoprolol succinate  25 mg Oral Daily    Vital Signs    Vitals:   04/06/19 1924 04/06/19 2050 04/07/19 0345 04/07/19 0734  BP: 131/60 (!) 130/59 (!) 143/81 129/85  Pulse: (!) 53 (!) 52 (!) 52 (!) 53  Resp: (!) 23  14 18   Temp:  98.8 F (37.1 C)  98.7 F (37.1 C) 98.5 F (36.9 C)  TempSrc:  Oral Oral Oral  SpO2: 98% 98% 98% 97%  Weight:  67.4 kg 67.7 kg   Height:  6\' 3"  (1.905 m)      Intake/Output Summary (Last 24 hours) at 04/07/2019 0834 Last data filed at 04/07/2019 0349 Gross per 24 hour  Intake 290 ml  Output 0 ml  Net 290 ml   Filed Weights   04/05/19 2053 04/06/19 2050 04/07/19 0345  Weight: 70.3 kg 67.4 kg 67.7 kg    Physical Exam    GEN: Well nourished, well developed, in no acute distress.  HEENT: normal.  Neck: Supple, no JVD, carotid bruits, or masses. Cardiac: Irregular heartbeat Respiratory:  Respirations regular and unlabored, clear to auscultation bilaterally. GI: Soft, nontender, nondistended, BS + x 4. MS: no deformity or atrophy. Skin: warm and dry, no rash. Neuro:  Strength and sensation are intact. Psych: Only intermittently able to answer questions appropriately.  Labs    CBC Recent Labs    04/05/19 1953 04/06/19 0333 04/07/19 0534  WBC 8.6 8.0 8.5  NEUTROABS 6.6  --   --   HGB 14.0 12.8* 11.9*  HCT 40.9 36.7* 35.2*  MCV 97.4 94.3 95.7  PLT 144* 126* 118*   Basic Metabolic Panel Recent Labs    14/12/20 2241 04/06/19 0333 04/07/19 0534  NA  --  137 139  K  --  3.5 3.6  CL  --  106 111  CO2  --  20* 21*  GLUCOSE  --  513* 159*  BUN  --  20 18  CREATININE  --  1.27* 1.06  CALCIUM  --  8.3* 8.5*  MG 1.6*  --   --    Liver Function Tests Recent Labs    04/05/19 1953  AST 87*  ALT 61*  ALKPHOS 124  BILITOT 0.8  PROT 6.7  ALBUMIN 3.7   No results for input(s): LIPASE, AMYLASE in the last 72 hours. Cardiac Enzymes No results for input(s): CKTOTAL, CKMB, CKMBINDEX, TROPONINI in the last 72 hours. BNP Recent Labs    04/05/19 1953  BNP 195.0*   D-Dimer No results for input(s): DDIMER in the last 72 hours. Hemoglobin A1C Recent Labs    04/06/19 1144  HGBA1C 7.8*   Fasting Lipid Panel Recent Labs    04/06/19 0333  CHOL 154  HDL 31*  LDLCALC 101*   TRIG 109  CHOLHDL 5.0   Thyroid Function Tests Recent Labs    04/05/19 2241  TSH 1.245    Telemetry    Sinus rhythm with intermittent second-degree heart block.  ECG    Wide-complex tachycardia/sinus rhythm with second-degree heart block  Radiology    DG Chest Portable 1 View  Result Date: 04/05/2019 CLINICAL DATA:  Atrial fibrillation and chest pain EXAM: PORTABLE CHEST 1 VIEW COMPARISON:  02/25/2018 FINDINGS: Cardiac shadow is enlarged but stable. Aortic calcifications are again seen. Vascular congestion and interstitial edema is noted. No sizable effusion is seen. No focal infiltrate is noted. IMPRESSION: Changes consistent with congestive failure. Electronically Signed   By: Alcide Clever M.D.   On: 04/05/2019 23:42   ECHOCARDIOGRAM COMPLETE  Result Date: 04/06/2019   ECHOCARDIOGRAM REPORT   Patient Name:   Kurt [name] Date of Exam: 04/06/2019 Medical Rec #:  [id number]      Height:       75.0 in Accession #:    [id number]     Weight:       155.0 lb Date of Birth:  [birth year]      BSA:          1.97 m Patient Age:    78 years       BP:           110/59 mmHg Patient Gender: M              HR:           47 bpm. Exam Location:  [facility] Procedure: 2D Echo and Intracardiac Opacification Agent Indications:     NSTEMI  History:         Patient has prior history of Echocardiogram examinations. CHF,                  Arrythmias:Atrial Fibrillation; Risk Factors:Diabetes and                  Current Smoker.  Sonographer:     LTM Referring Phys:  [id number] Andris Baumann Diagnosing Phys: Harold Hedge MD IMPRESSIONS  1. Left ventricular ejection fraction, by visual estimation, is 20 to 25%. The left ventricle has severely decreased function. Left ventricular septal wall thickness was normal. Normal left ventricular posterior wall thickness. There is no left ventricular hypertrophy.  2. Definity contrast agent was given IV to delineate the left ventricular endocardial borders.  3. Mildly dilated left  ventricular internal cavity size.  4. The left ventricle demonstrates global hypokinesis.  5. Global right ventricle has normal systolic function.The right ventricular size is normal. No increase in right ventricular wall thickness.  6. Left atrial size was normal.  7. Right atrial size was mildly dilated.  8. The mitral valve is grossly normal. Mild to moderate mitral valve regurgitation. No evidence of mitral stenosis.  9. The tricuspid valve is not well visualized. Tricuspid valve regurgitation is trivial. 10. The aortic valve was not well visualized. Aortic valve regurgitation is trivial. 11. The pulmonic valve was not well visualized. Pulmonic valve regurgitation is trivial. 12. Mildly elevated pulmonary artery systolic pressure. 13. The atrial septum is grossly normal. FINDINGS  Left Ventricle: Left ventricular ejection fraction, by visual estimation, is 20 to 25%. The left ventricle has severely decreased function. Definity contrast agent was given IV to delineate the left ventricular endocardial borders. The left ventricle demonstrates global hypokinesis. The left ventricular internal cavity size was mildly dilated left ventricle. Normal left ventricular posterior wall thickness. There is no left ventricular hypertrophy. Right Ventricle: The right ventricular size is normal. No increase in right ventricular wall thickness. Global RV systolic function is has normal systolic function. The tricuspid regurgitant velocity is 2.49 m/s, and with an assumed right atrial pressure  of 10 mmHg, the estimated right ventricular systolic pressure is mildly elevated at 34.8 mmHg. Left Atrium: Left atrial size was normal in size. Right Atrium: Right atrial size was mildly dilated Pericardium: There is no evidence of pericardial effusion. Mitral Valve: The mitral valve is grossly normal. Mild to moderate mitral valve regurgitation. No evidence of mitral valve stenosis by observation. Tricuspid Valve: The tricuspid valve is  not well visualized. Tricuspid valve regurgitation is trivial. Aortic Valve: The aortic valve was not well visualized. Aortic valve regurgitation is trivial. Aortic valve mean gradient measures 3.0 mmHg. Aortic valve peak gradient measures 5.0 mmHg. Aortic valve area, by VTI measures 1.37 cm. Pulmonic Valve: The pulmonic valve was not well visualized. Pulmonic valve regurgitation is trivial. Pulmonic regurgitation is trivial. Aorta: The aortic root is normal in size and structure. IAS/Shunts: The atrial septum is grossly normal.  LEFT VENTRICLE PLAX 2D LVIDd:         4.70 cm       Diastology LVIDs:         4.34 cm       LV e' lateral:   4.62 cm/s LV PW:         0.75 cm       LV E/e' lateral: 18.1 LV IVS:        1.27 cm       LV e' medial:    4.13 cm/s LVOT diam:     1.70 cm       LV E/e' medial:  20.2 LV SV:         17 ml LV SV Index:   9.12 LVOT Area:     2.27 cm  LV Volumes (MOD) LV area d, A2C:    32.90 cm LV area d, A4C:    36.90 cm LV area s, A2C:    26.70 cm LV area s, A4C:    26.00 cm LV major d, A2C:   8.08 cm LV major d, A4C:   8.42 cm LV major s, A2C:   7.58 cm LV major s, A4C:   7.21 cm LV vol d, MOD A2C: 110.0 ml LV vol d, MOD A4C: 132.0 ml LV vol s, MOD A2C: 76.0 ml LV vol s, MOD A4C: 75.1 ml LV SV  MOD A2C:     34.0 ml LV SV MOD A4C:     132.0 ml LV SV MOD BP:      45.5 ml RIGHT VENTRICLE RV S prime:     8.92 cm/s TAPSE (M-mode): 2.4 cm LEFT ATRIUM             Index LA diam:        1.70 cm 0.86 cm/m LA Vol (A2C):   58.7 ml 29.81 ml/m LA Vol (A4C):   42.3 ml 21.48 ml/m LA Biplane Vol: 50.8 ml 25.80 ml/m  AORTIC VALVE AV Area (Vmax):    1.29 cm AV Area (Vmean):   1.09 cm AV Area (VTI):     1.37 cm AV Vmax:           112.00 cm/s AV Vmean:          80.500 cm/s AV VTI:            0.236 m AV Peak Grad:      5.0 mmHg AV Mean Grad:      3.0 mmHg LVOT Vmax:         63.50 cm/s LVOT Vmean:        38.800 cm/s LVOT VTI:          0.142 m LVOT/AV VTI ratio: 0.60  AORTA Ao Root diam: 3.60 cm MITRAL VALVE                          TRICUSPID VALVE MV Area (PHT): 2.34 cm              TR Peak grad:   24.8 mmHg MV PHT:        94.00 msec            TR Vmax:        284.00 cm/s MV E velocity: 83.40 cm/s  103 cm/s MV A velocity: 130.00 cm/s 70.3 cm/s SHUNTS MV E/A ratio:  0.64        1.5       Systemic VTI:  0.14 m                                      Systemic Diam: 1.70 cm  Laniesha Das MHarold Hedge Electronically signed by Harold HedgeKenneth Arienne Gartin MD Signature Date/Time: 04/06/2019/2:25:05 PM    Final     Assessment & Plan    78 year old male with diabetes, tobacco abuse, peripheral vascular disease status post endovascular stent for AAA, history of atrial fibrillation and an known ejection fraction of 25% presented with wide-complex tachycardia. Had some chest heaviness during this episode. He is currently in sinus rhythm with intermittent second-degree heart block. Troponin 1559 thus far. BNP was 195 on admission.  Wide-complex tachycardia cardia-VT versus aberrant atrial fibrillation. Received amiodarone with conversion to sinus rhythm with second-degree heart block. Somewhat bradycardic. Amiodarone was discontinued. Currently on metoprolol succinate at 25 mg daily although has some bradycardia but no prolonged pauses. We will continue with metoprolol however reduced to 12.5 mg daily. Will follow rhythm. Maintain magnesium greater than 2 and potassium greater than 4. Ejection fraction less than 30. Will need to consider AICD therapy. Patient is a somewhat difficult historian. Will discuss with his primary cardiologist tomorrow to discuss consideration for cardiac catheterization to evaluate coronary anatomy to guide further therapy. We will repeat troponin today. Will give clear liquids in the morning  and to [place] whether or not to proceed with invasive evaluation later in the day tomorrow.  Cardiomyopathy-etiology unclear ischemic versus idiopathic. Patient not an ideal candidate for surgical intervention. Unlikely single-vessel  disease causing his symptoms however consideration for invasive evaluation will need to be discussed. We will discussed this at length with his primary cardiologist in the morning. We will continue with evidence-based beta-blocker at low dose. We will also add afterload reduction with lisinopril and spironolactone following hemodynamics. Will recheck BNP. Only minimally abnormal on admission.  Atrial fibrillation-off Eliquis for now due to possible invasive evaluation. We will continue with heparin and low-dose beta-blocker following rate and rhythm. Will need to discuss resuming Eliquis prior to discharge.  Peripheral vascular disease-status post endovascular stent for AAA. Being followed by vascular surgery as an outpatient.  Diabetes mellitus-somewhat hyperglycemic on presentation. This is improving.  Signed, Darlin Priestly Tyauna Lacaze MD 04/07/2019, 8:34 AM  Pager: [phone]   Addendum-BNP  elevate to 1131 up from 195 2 days ago.  Would need to improve CHF and volume control.  We will continue to diurese following renal function.  Not an ideal candidate for contrast study at this point.  Will make further determination in a.m. as discussed above however likely would need more aggressive treatment of heart failure.  Have added ACE inhibitor and spironolactone as per above.  D.  Would continue to carefully diurese following hemodynamics.

## 2019-04-08 ENCOUNTER — Encounter
Admission: EM | Disposition: A | Discharge status: Other Acute Inpatient Hospital | Payer: Self-pay | Source: Home / Self Care | Attending: Internal Medicine

## 2019-04-08 DIAGNOSIS — I5033 Acute on chronic diastolic (congestive) heart failure: Secondary | ICD-10-CM

## 2019-04-08 HISTORY — PX: LEFT HEART CATH AND CORONARY ANGIOGRAPHY: CATH118249

## 2019-04-08 LAB — GLUCOSE, CAPILLARY
Glucose-Capillary: 94 mg/dL (ref 70–99)
Glucose-Capillary: 185 mg/dL — ABNORMAL HIGH (ref 70–99)
Glucose-Capillary: 117 mg/dL — ABNORMAL HIGH (ref 70–99)
Glucose-Capillary: 123 mg/dL — ABNORMAL HIGH (ref 70–99)

## 2019-04-08 LAB — CBC
HCT: 33.4 % — ABNORMAL LOW (ref 39.0–52.0)
Hemoglobin: 12 g/dL — ABNORMAL LOW (ref 13.0–17.0)
MCH: 33.1 pg (ref 26.0–34.0)
MCHC: 35.9 g/dL (ref 30.0–36.0)
MCV: 92 fL (ref 80.0–100.0)
Platelets: 126 10*3/uL — ABNORMAL LOW (ref 150–400)
RBC: 3.63 MIL/uL — ABNORMAL LOW (ref 4.22–5.81)
RDW: 13.2 % (ref 11.5–15.5)
WBC: 6.3 10*3/uL (ref 4.0–10.5)
nRBC: 0 % (ref 0.0–0.2)

## 2019-04-08 LAB — BASIC METABOLIC PANEL
Anion gap: 9 (ref 5–15)
BUN: 21 mg/dL (ref 8–23)
CO2: 22 mmol/L (ref 22–32)
Calcium: 8.4 mg/dL — ABNORMAL LOW (ref 8.9–10.3)
Chloride: 108 mmol/L (ref 98–111)
Creatinine, Ser: 1.17 mg/dL (ref 0.61–1.24)
GFR calc Af Amer: >60 mL/min (ref >60)
GFR calc non Af Amer: >60 mL/min (ref >60)
Glucose, Bld: 101 mg/dL — ABNORMAL HIGH (ref 70–99)
Potassium: 3.3 mmol/L — ABNORMAL LOW (ref 3.5–5.1)
Sodium: 139 mmol/L (ref 135–145)

## 2019-04-08 LAB — BRAIN NATRIURETIC PEPTIDE: B Natriuretic Peptide: 744 pg/mL — ABNORMAL HIGH (ref 0.0–100.0)

## 2019-04-08 LAB — HEPARIN LEVEL (UNFRACTIONATED): Heparin Unfractionated: 0.31 IU/mL (ref 0.30–0.70)

## 2019-04-08 SURGERY — LEFT HEART CATH AND CORONARY ANGIOGRAPHY
Anesthesia: Moderate Sedation

## 2019-04-08 MED ORDER — SODIUM CHLORIDE 0.9 % IV SOLN: 250.0000 mL | INTRAVENOUS | Status: DC | PRN

## 2019-04-08 MED ORDER — MIDAZOLAM HCL 2 MG/2ML IJ SOLN
INTRAMUSCULAR | Status: AC
  Filled 2019-04-08: qty 2

## 2019-04-08 MED ORDER — ONDANSETRON HCL 4 MG/2ML IJ SOLN: 4.0000 mg | Freq: Four times a day (QID) | INTRAMUSCULAR | Status: DC | PRN

## 2019-04-08 MED ORDER — VERAPAMIL HCL 2.5 MG/ML IV SOLN
INTRAVENOUS | Status: AC
  Filled 2019-04-08: qty 2

## 2019-04-08 MED ORDER — ASPIRIN 81 MG PO CHEW: 81.0000 mg | CHEWABLE_TABLET | Freq: Every day | ORAL | Status: DC

## 2019-04-08 MED ORDER — SODIUM CHLORIDE 0.9% FLUSH
3.0000 mL | Freq: Two times a day (BID) | INTRAVENOUS | Status: DC
  Administered 2019-04-09 – 2019-04-11 (×5): 3 mL via INTRAVENOUS

## 2019-04-08 MED ORDER — SODIUM CHLORIDE 0.9 % IV SOLN
INTRAVENOUS | Status: DC
  Administered 2019-04-08: 10:00:00 via INTRAVENOUS

## 2019-04-08 MED ORDER — FENTANYL CITRATE (PF) 100 MCG/2ML IJ SOLN
INTRAMUSCULAR | Status: AC
  Filled 2019-04-08: qty 2

## 2019-04-08 MED ORDER — HEPARIN SODIUM (PORCINE) 1000 UNIT/ML IJ SOLN
INTRAMUSCULAR | Status: DC | PRN
  Administered 2019-04-08: 3000 [IU] via INTRAVENOUS

## 2019-04-08 MED ORDER — HEPARIN (PORCINE) IN NACL 1000-0.9 UT/500ML-% IV SOLN
INTRAVENOUS | Status: AC
  Filled 2019-04-08: qty 1000

## 2019-04-08 MED ORDER — SODIUM CHLORIDE 0.9 % WEIGHT BASED INFUSION: 1.0000 mL/kg/h | INTRAVENOUS | Status: AC

## 2019-04-08 MED ORDER — IOHEXOL 300 MG/ML  SOLN
INTRAMUSCULAR | Status: DC | PRN
  Administered 2019-04-08: 70 mL

## 2019-04-08 MED ORDER — VERAPAMIL HCL 2.5 MG/ML IV SOLN
INTRAVENOUS | Status: DC | PRN
  Administered 2019-04-08: 2.5 mg via INTRAVENOUS

## 2019-04-08 MED ORDER — HEPARIN SODIUM (PORCINE) 1000 UNIT/ML IJ SOLN
INTRAMUSCULAR | Status: AC
  Filled 2019-04-08: qty 1

## 2019-04-08 MED ORDER — SODIUM CHLORIDE 0.9% FLUSH: 3.0000 mL | INTRAVENOUS | Status: DC | PRN

## 2019-04-08 MED ORDER — FUROSEMIDE 20 MG PO TABS
20.0000 mg | ORAL_TABLET | Freq: Every day | ORAL | Status: DC
  Administered 2019-04-09 – 2019-04-12 (×4): 20 mg via ORAL
  Filled 2019-04-08 (×4): qty 1

## 2019-04-08 MED ORDER — ASPIRIN 81 MG PO CHEW: 81.0000 mg | CHEWABLE_TABLET | ORAL | Status: DC

## 2019-04-08 MED ORDER — LABETALOL HCL 5 MG/ML IV SOLN: 10.0000 mg | INTRAVENOUS | Status: AC | PRN

## 2019-04-08 MED ORDER — MIDAZOLAM HCL 2 MG/2ML IJ SOLN
INTRAMUSCULAR | Status: DC | PRN
  Administered 2019-04-08: 1 mg via INTRAVENOUS

## 2019-04-08 MED ORDER — POTASSIUM CHLORIDE CRYS ER 20 MEQ PO TBCR
20.0000 meq | EXTENDED_RELEASE_TABLET | Freq: Two times a day (BID) | ORAL | Status: DC
  Administered 2019-04-08 – 2019-04-12 (×9): 20 meq via ORAL
  Filled 2019-04-08 (×9): qty 1

## 2019-04-08 MED ORDER — SODIUM CHLORIDE 0.9% FLUSH
3.0000 mL | Freq: Two times a day (BID) | INTRAVENOUS | Status: DC
  Administered 2019-04-09 – 2019-04-11 (×5): 3 mL via INTRAVENOUS

## 2019-04-08 MED ORDER — ACETAMINOPHEN 325 MG PO TABS: 650.0000 mg | ORAL_TABLET | ORAL | Status: DC | PRN

## 2019-04-08 MED ORDER — HYDRALAZINE HCL 20 MG/ML IJ SOLN: 10.0000 mg | INTRAMUSCULAR | Status: AC | PRN

## 2019-04-08 MED ORDER — FENTANYL CITRATE (PF) 100 MCG/2ML IJ SOLN
INTRAMUSCULAR | Status: DC | PRN
  Administered 2019-04-08: 50 ug via INTRAVENOUS

## 2019-04-08 MED ORDER — HEPARIN (PORCINE) IN NACL 1000-0.9 UT/500ML-% IV SOLN
INTRAVENOUS | Status: DC | PRN
  Administered 2019-04-08: 500 mL

## 2019-04-08 SURGICAL SUPPLY — 9 items
CATH INFINITI 5 FR JL3.5 (CATHETERS) ×3 IMPLANT
CATH INFINITI 5FR ANG PIGTAIL (CATHETERS) ×3 IMPLANT
CATH INFINITI JR4 5F (CATHETERS) ×3 IMPLANT
DEVICE RAD TR BAND REGULAR (VASCULAR PRODUCTS) ×3 IMPLANT
GLIDESHEATH SLEND SS 6F .021 (SHEATH) ×3 IMPLANT
KIT MANI 3VAL PERCEP (MISCELLANEOUS) ×3 IMPLANT
PACK CARDIAC CATH (CUSTOM PROCEDURE TRAY) ×3 IMPLANT
WIRE HITORQ VERSACORE ST 145CM (WIRE) ×3 IMPLANT
WIRE ROSEN-J .035X260CM (WIRE) ×3 IMPLANT

## 2019-04-08 NOTE — Progress Notes (Signed)
Patient ID: Kurt Kurt [name], male   DOB: [birth year], 78 y.o.   MRN: [id number] [facility] PROGRESS NOTE  Kurt Kurt [name] NID:[id number] DOB: May 31, 1940 DOA: 04/05/2019 PCP: Kurt Shaggy, MD  HPI/Subjective: Patient felt well and offers no complaints.  Anxious about being here in the hospital.  Wanting to go home as soon as possible.  Objective: Vitals:   04/08/19 0739 04/08/19 1536  BP: 122/68 134/83  Kurt [name]: (!) 51 (!) 42  Resp: 18 18  Temp: 98.2 F (36.8 C) (!) 97.5 F (36.4 C)  SpO2: 91% 98%    Filed Weights   04/06/19 2050 04/07/19 0345 04/08/19 0514  Weight: 67.4 kg 67.7 kg 64.7 kg    ROS: Review of Systems  Constitutional: Negative for chills and fever.  Eyes: Negative for blurred vision.  Respiratory: Positive for cough. Negative for shortness of breath.   Cardiovascular: Negative for chest pain.  Gastrointestinal: Negative for abdominal pain, constipation, diarrhea, nausea and vomiting.  Genitourinary: Negative for dysuria.  Musculoskeletal: Negative for joint pain.  Neurological: Negative for dizziness and headaches.   Exam: Physical Exam  Constitutional: He is oriented to person, place, and time.  HENT:  Nose: No mucosal edema.  Mouth/Throat: No oropharyngeal exudate or posterior oropharyngeal edema.  Eyes: Pupils are equal, round, and reactive to light. Conjunctivae, EOM and lids are normal.  Neck: No JVD present. Carotid bruit is not present. No thyroid mass and no thyromegaly present.  Cardiovascular: S1 normal and S2 normal. Bradycardia present. Exam reveals no gallop.  No murmur heard. Respiratory: No respiratory distress. He has decreased breath sounds in the right lower field and the left lower field. He has no wheezes. He has no rhonchi. He has no rales.  GI: Soft. Bowel sounds are normal. There is no abdominal tenderness.  Musculoskeletal:     Cervical back: No edema.     Right ankle: No swelling.     Left ankle: No swelling.  Lymphadenopathy:     He has no cervical adenopathy.  Neurological: He is alert and oriented to person, place, and time. No cranial nerve deficit.  Skin: Skin is warm. Nails show no clubbing.  Stage II decubitus ulcer.  Psychiatric: He has a normal mood and affect.      Data Reviewed: Basic Metabolic Panel: Recent Labs  Lab 04/05/19 1953 04/05/19 2241 04/06/19 0333 04/07/19 0534 04/08/19 0551  NA 136  --  137 139 139  K 4.1  --  3.5 3.6 3.3*  CL 103  --  106 111 108  CO2 24  --  20* 21* 22  GLUCOSE 575*  --  513* 159* 101*  BUN 21  --  20 18 21   CREATININE 1.37*  --  1.27* 1.06 1.17  CALCIUM 8.9  --  8.3* 8.5* 8.4*  MG  --  1.6*  --   --   --    Liver Function Tests: Recent Labs  Lab 04/05/19 1953  AST 87*  ALT 61*  ALKPHOS 124  BILITOT 0.8  PROT 6.7  ALBUMIN 3.7   CBC: Recent Labs  Lab 04/05/19 1953 04/06/19 0333 04/07/19 0534 04/08/19 0551  WBC 8.6 8.0 8.5 6.3  NEUTROABS 6.6  --   --   --   HGB 14.0 12.8* 11.9* 12.0*  HCT 40.9 36.7* 35.2* 33.4*  MCV 97.4 94.3 95.7 92.0  PLT 144* 126* 118* 126*   BNP (last 3 results) Recent Labs    04/05/19 1953 04/07/19 0931 04/08/19 0551  BNP 195.0* 1,131.0* 744.0*   CBG: Recent Labs  Lab 04/07/19 1137 04/07/19 1659 04/07/19 2106 04/08/19 0741 04/08/19 1127  GLUCAP 222* 83 157* 94 185*    Recent Results (from the past 240 hour(s))  SARS CORONAVIRUS 2 (TAT 6-24 HRS) Nasopharyngeal Nasopharyngeal Swab     Status: None   Collection Time: 04/05/19 11:36 PM   Specimen: Nasopharyngeal Swab  Result Value Ref Range Status   SARS Coronavirus 2 NEGATIVE NEGATIVE Final    Comment: (NOTE) SARS-CoV-2 target nucleic acids are NOT DETECTED. The SARS-CoV-2 RNA is generally detectable in upper and lower respiratory specimens during the acute phase of infection. Negative results do not preclude SARS-CoV-2 infection, do not rule out co-infections with other pathogens, and should not be used as the sole basis for treatment or other  patient management decisions. Negative results must be combined with clinical observations, patient history, and epidemiological information. The expected result is Negative. Fact Sheet for Patients: SugarRoll.be Fact Sheet for Healthcare Providers: https://www.woods-mathews.com/ This test is not yet approved or cleared by the [place] FDA and  has been authorized for detection and/or diagnosis of SARS-CoV-2 by FDA under an Emergency Use Authorization (EUA). This EUA will remain  in effect (meaning this test can be used) for the duration of the COVID-19 declaration under Section 56 4(b)(1) of the Act, 21 U.S.C. section 360bbb-3(b)(1), unless the authorization is terminated or revoked sooner. Performed at [facility], [place] [address] [place], [place] [address]       Scheduled Meds: . [START ON 04/09/2019] aspirin  81 mg Oral Pre-Cath  . aspirin EC  81 mg Oral Daily  . docusate sodium  100 mg Oral BID  . donepezil  5 mg Oral Daily  . furosemide  20 mg Intravenous Once  . [START ON 04/09/2019] furosemide  20 mg Oral Daily  . insulin aspart  0-5 Units Subcutaneous QHS  . insulin aspart  0-9 Units Subcutaneous TID WC  . lisinopril  5 mg Oral Daily  . metoprolol succinate  12.5 mg Oral Daily  . potassium chloride  20 mEq Oral BID  . sodium chloride flush  3 mL Intravenous Q12H  . spironolactone  12.5 mg Oral Daily   Continuous Infusions: . sodium chloride    . [START ON 04/09/2019] sodium chloride 10 mL/hr at 04/08/19 0942  . heparin 900 Units/hr (04/08/19 1125)    Assessment/Plan:  1. Wide-complex tachycardia on presentation and now in atrial fibrillation.  Patient on low-dose beta-blocker.  On heparin drip for now.  Cardiac catheterization this afternoon. 2. 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  Patient has an ejection fraction of 20 to 25%.  Cardiology to decide on whether or not  defibrillator needed.  Patient on low-dose metoprolol.  Spironolactone and lisinopril added on 04/07/2019.  Change Lasix to oral for tomorrow. 3. Rising troponin.  This could be demand ischemia with rapid heart rate.  Eliquis on hold and started on heparin. Cardiology to do a cardiac catheterization this afternoon. 4. Type 2 diabetes mellitus with hyperglycemia.  Patient was placed on insulin drip and switched over to glargine insulin and sliding scale.  Patient's hemoglobin A1c is 7.8.  Held Lantus this morning secondary to cardiac cath today.  May be a candidate to go back on oral medications as outpatient. 5. AAA history 6. Stage II decubitus ulcer. 7. Weakness.  Physical therapy evaluation  Code Status:     Code Status Orders  (From admission, onward)  Start     Ordered   04/06/19 0035  Full code  Continuous     04/06/19 0045        Code Status History    Date Active Date Inactive Code Status Order ID Comments User Context   02/28/2018 2049 03/01/2018 1612 Full Code [id number]  Katha HammingKonidena, Snehalatha, MD ED   02/17/2018 1554 02/23/2018 2009 Full Code [id number]  Enedina FinnerPatel, Sona, MD Inpatient   Advance Care Planning Activity    Advance Directive Documentation     Most Recent Value  Type of Advance Directive  Healthcare Power of Attorney, Living will  Pre-existing out of facility DNR order (yellow form or pink MOST form)  --  "MOST" Form in Place?  --     Family Communication: POA on the phone. Disposition Plan: Depending on cardiac catheterization results and whether AICD is needed  Consultants:  Cardiology  Time spent: 27 minutes  Arabia Nylund [employer]  [facility]

## 2019-04-08 NOTE — Progress Notes (Signed)
OT Cancellation Note  Patient Details Name: LEONIDAS BOATENG MRN: [id number] DOB: July 23, 1940   Cancelled Treatment:    Reason Eval/Treat Not Completed: Patient not medically ready. Consult received, chart reviewed. Pt currently scheduled for cardiac cath this date. Will hold OT evaluation once medically stable.  Jeni Salles, MPH, MS, OTR/L ascom [phone] 04/08/19, 12:17 PM

## 2019-04-08 NOTE — Progress Notes (Signed)
Patient Name: Kurt [name] Date of Encounter: 04/08/2019  Hospital Problem List     Principal Problem:   Wide-complex tachycardia [facility]) Active Problems:   Tobacco dependence   Abdominal aortic aneurysm without rupture (HCC)   PVD (peripheral vascular disease) (HCC)   Rapid atrial fibrillation (HCC)   RBBB   Hyperglycemia due to type 2 diabetes mellitus (HCC)   Acute on chronic diastolic CHF (congestive heart failure) (HCC)   NSTEMI (non-ST elevated myocardial infarction) (HCC)   Atrial flutter with rapid ventricular response (HCC)   Atrial fibrillation, chronic (HCC)   Decubitus ulcer of sacral region, stage 2 [facility])    Patient Profile     78 year old male with history of diabetes, tobacco abuse, peripheral vascular disease, atrial fibrillation admitted with wide-complex tachycardia. He also has a known cardiomyopathy with ejection fraction of 20 to 25%. Etiology of this is as of yet unclear. He presented with his wide-complex tachycardia that was relatively hemodynamically stable. He was given amiodarone followed by adenosine. He is converted to a sinus rhythm with intermittent Mobitz 1 heart block. There have been no prolonged pauses. No recurrent sustained arrhythmias. Has occasional disease. He is a very difficult year historian. He denies chest pain or shortness of breath. Troponin peaked at [address] and is trending down. Will attempt to proceed with left cardiac cath today to evaluate anatomy to guide further therapy.   Subjective   No complaints  Inpatient Medications    . aspirin EC  81 mg Oral Daily  . docusate sodium  100 mg Oral BID  . donepezil  5 mg Oral Daily  . furosemide  20 mg Intravenous Once  . furosemide  20 mg Intravenous BID  . insulin aspart  0-5 Units Subcutaneous QHS  . insulin aspart  0-9 Units Subcutaneous TID WC  . insulin glargine  11 Units Subcutaneous Daily  . lisinopril  5 mg Oral Daily  . metoprolol succinate  12.5 mg Oral Daily  . sodium  chloride flush  3 mL Intravenous Q12H  . spironolactone  12.5 mg Oral Daily    Vital Signs    Vitals:   04/07/19 0734 04/07/19 1554 04/07/19 1917 04/08/19 0514  BP: 129/85 100/73 126/86 130/81  Pulse: (!) 53 (!) 54 65 (!) 54  Resp: 18 19 18 16   Temp: 98.5 F (36.9 C) 98.2 F (36.8 C) 98.1 F (36.7 C) 98.5 F (36.9 C)  TempSrc: Oral  Oral Oral  SpO2: 97% 99% 95% 97%  Weight:    64.7 kg  Height:        Intake/Output Summary (Last 24 hours) at 04/08/2019 0735 Last data filed at 04/07/2019 1920 Gross per 24 hour  Intake 240 ml  Output 1500 ml  Net -1260 ml   Filed Weights   04/06/19 2050 04/07/19 0345 04/08/19 0514  Weight: 67.4 kg 67.7 kg 64.7 kg    Physical Exam    GEN: Well nourished, well developed, in no acute distress.  HEENT: normal.  Neck: Supple, no JVD, carotid bruits, or masses. Cardiac: RRR, no murmurs, rubs, or gallops. No clubbing, cyanosis, edema.  Radials/DP/PT 2+ and equal bilaterally.  Respiratory:  Respirations regular and unlabored, clear to auscultation bilaterally. GI: Soft, nontender, nondistended, BS + x 4. MS: no deformity or atrophy. Skin: warm and dry, no rash. Neuro:  Strength and sensation are intact. Psych: Normal affect.  Labs    CBC Recent Labs    04/05/19 1953 04/07/19 0534 04/08/19 0551  WBC 8.6 8.5  6.3  NEUTROABS 6.6  --   --   HGB 14.0 11.9* 12.0*  HCT 40.9 35.2* 33.4*  MCV 97.4 95.7 92.0  PLT 144* 118* 154*   Basic Metabolic Panel Recent Labs    04/05/19 2241 04/07/19 0534 04/08/19 0551  NA  --  139 139  K  --  3.6 3.3*  CL  --  111 108  CO2  --  21* 22  GLUCOSE  --  159* 101*  BUN  --  18 21  CREATININE  --  1.06 1.17  CALCIUM  --  8.5* 8.4*  MG 1.6*  --   --    Liver Function Tests Recent Labs    04/05/19 1953  AST 87*  ALT 61*  ALKPHOS 124  BILITOT 0.8  PROT 6.7  ALBUMIN 3.7   No results for input(s): LIPASE, AMYLASE in the last 72 hours. Cardiac Enzymes No results for input(s): CKTOTAL,  CKMB, CKMBINDEX, TROPONINI in the last 72 hours. BNP Recent Labs    04/05/19 1953 04/07/19 0931  BNP 195.0* 1,131.0*   D-Dimer No results for input(s): DDIMER in the last 72 hours. Hemoglobin A1C Recent Labs    04/06/19 1144  HGBA1C 7.8*   Fasting Lipid Panel Recent Labs    04/06/19 0333  CHOL 154  HDL 31*  LDLCALC 101*  TRIG 109  CHOLHDL 5.0   Thyroid Function Tests Recent Labs    04/05/19 2241  TSH 1.245    Telemetry    nsr  ECG    nsr  Radiology    DG Chest Portable 1 View  Result Date: 04/05/2019 CLINICAL DATA:  Atrial fibrillation and chest pain EXAM: PORTABLE CHEST 1 VIEW COMPARISON:  02/25/2018 FINDINGS: Cardiac shadow is enlarged but stable. Aortic calcifications are again seen. Vascular congestion and interstitial edema is noted. No sizable effusion is seen. No focal infiltrate is noted. IMPRESSION: Changes consistent with congestive failure. Electronically Signed   By: Inez Catalina M.D.   On: 04/05/2019 23:42   ECHOCARDIOGRAM COMPLETE  Result Date: 04/06/2019   ECHOCARDIOGRAM REPORT   Patient Name:   Kurt [name] Date of Exam: 04/06/2019 Medical Rec #:  [id number]      Height:       75.0 in Accession #:    [id number]     Weight:       155.0 lb Date of Birth:  04-10-1941      BSA:          1.97 m Patient Age:    47 years       BP:           110/59 mmHg Patient Gender: M              HR:           47 bpm. Exam Location:  [facility] Procedure: 2D Echo and Intracardiac Opacification Agent Indications:     NSTEMI  History:         Patient has prior history of Echocardiogram examinations. CHF,                  Arrythmias:Atrial Fibrillation; Risk Factors:Diabetes and                  Current Smoker.  Sonographer:     LTM Referring Phys:  [id number] Athena Masse Diagnosing Phys: Bartholome Bill MD IMPRESSIONS  1. Left ventricular ejection fraction, by visual estimation, is 20 to 25%. The left ventricle has severely decreased function.  Left ventricular septal wall  thickness was normal. Normal left ventricular posterior wall thickness. There is no left ventricular hypertrophy.  2. Definity contrast agent was given IV to delineate the left ventricular endocardial borders.  3. Mildly dilated left ventricular internal cavity size.  4. The left ventricle demonstrates global hypokinesis.  5. Global right ventricle has normal systolic function.The right ventricular size is normal. No increase in right ventricular wall thickness.  6. Left atrial size was normal.  7. Right atrial size was mildly dilated.  8. The mitral valve is grossly normal. Mild to moderate mitral valve regurgitation. No evidence of mitral stenosis.  9. The tricuspid valve is not well visualized. Tricuspid valve regurgitation is trivial. 10. The aortic valve was not well visualized. Aortic valve regurgitation is trivial. 11. The pulmonic valve was not well visualized. Pulmonic valve regurgitation is trivial. 12. Mildly elevated pulmonary artery systolic pressure. 13. The atrial septum is grossly normal. FINDINGS  Left Ventricle: Left ventricular ejection fraction, by visual estimation, is 20 to 25%. The left ventricle has severely decreased function. Definity contrast agent was given IV to delineate the left ventricular endocardial borders. The left ventricle demonstrates global hypokinesis. The left ventricular internal cavity size was mildly dilated left ventricle. Normal left ventricular posterior wall thickness. There is no left ventricular hypertrophy. Right Ventricle: The right ventricular size is normal. No increase in right ventricular wall thickness. Global RV systolic function is has normal systolic function. The tricuspid regurgitant velocity is 2.49 m/s, and with an assumed right atrial pressure  of 10 mmHg, the estimated right ventricular systolic pressure is mildly elevated at 34.8 mmHg. Left Atrium: Left atrial size was normal in size. Right Atrium: Right atrial size was mildly dilated Pericardium:  There is no evidence of pericardial effusion. Mitral Valve: The mitral valve is grossly normal. Mild to moderate mitral valve regurgitation. No evidence of mitral valve stenosis by observation. Tricuspid Valve: The tricuspid valve is not well visualized. Tricuspid valve regurgitation is trivial. Aortic Valve: The aortic valve was not well visualized. Aortic valve regurgitation is trivial. Aortic valve mean gradient measures 3.0 mmHg. Aortic valve peak gradient measures 5.0 mmHg. Aortic valve area, by VTI measures 1.37 cm. Pulmonic Valve: The pulmonic valve was not well visualized. Pulmonic valve regurgitation is trivial. Pulmonic regurgitation is trivial. Aorta: The aortic root is normal in size and structure. IAS/Shunts: The atrial septum is grossly normal.  LEFT VENTRICLE PLAX 2D LVIDd:         4.70 cm       Diastology LVIDs:         4.34 cm       LV e' lateral:   4.62 cm/s LV PW:         0.75 cm       LV E/e' lateral: 18.1 LV IVS:        1.27 cm       LV e' medial:    4.13 cm/s LVOT diam:     1.70 cm       LV E/e' medial:  20.2 LV SV:         17 ml LV SV Index:   9.12 LVOT Area:     2.27 cm  LV Volumes (MOD) LV area d, A2C:    32.90 cm LV area d, A4C:    36.90 cm LV area s, A2C:    26.70 cm LV area s, A4C:    26.00 cm LV major d, A2C:   8.08 cm LV  major d, A4C:   8.42 cm LV major s, A2C:   7.58 cm LV major s, A4C:   7.21 cm LV vol d, MOD A2C: 110.0 ml LV vol d, MOD A4C: 132.0 ml LV vol s, MOD A2C: 76.0 ml LV vol s, MOD A4C: 75.1 ml LV SV MOD A2C:     34.0 ml LV SV MOD A4C:     132.0 ml LV SV MOD BP:      45.5 ml RIGHT VENTRICLE RV S prime:     8.92 cm/s TAPSE (M-mode): 2.4 cm LEFT ATRIUM             Index LA diam:        1.70 cm 0.86 cm/m LA Vol (A2C):   58.7 ml 29.81 ml/m LA Vol (A4C):   42.3 ml 21.48 ml/m LA Biplane Vol: 50.8 ml 25.80 ml/m  AORTIC VALVE AV Area (Vmax):    1.29 cm AV Area (Vmean):   1.09 cm AV Area (VTI):     1.37 cm AV Vmax:           112.00 cm/s AV Vmean:          80.500 cm/s AV  VTI:            0.236 m AV Peak Grad:      5.0 mmHg AV Mean Grad:      3.0 mmHg LVOT Vmax:         63.50 cm/s LVOT Vmean:        38.800 cm/s LVOT VTI:          0.142 m LVOT/AV VTI ratio: 0.60  AORTA Ao Root diam: 3.60 cm MITRAL VALVE                         TRICUSPID VALVE MV Area (PHT): 2.34 cm              TR Peak grad:   24.8 mmHg MV PHT:        94.00 msec            TR Vmax:        284.00 cm/s MV E velocity: 83.40 cm/s  103 cm/s MV A velocity: 130.00 cm/s 70.3 cm/s SHUNTS MV E/A ratio:  0.64        1.5       Systemic VTI:  0.14 m                                      Systemic Diam: 1.70 cm  Harold Hedge MD Electronically signed by Harold Hedge MD Signature Date/Time: 04/06/2019/2:25:05 PM    Final     Assessment & Plan      78 year old male with diabetes, tobacco abuse, peripheral vascular disease status post endovascular stent for AAA, history of atrial fibrillation and an known ejection fraction of 25% presented with wide-complex tachycardia. Had some chest heaviness during this episode. He is currently in sinus rhythm with intermittent second-degree heart block. Troponin peaked at greater than 12 000 and is trending down. Marland Kitchen BNP was 195 on admission and increased to 1131 since admission.   Wide-complex tachycardia cardia-VT versus aberrant atrial fibrillation. Received amiodarone with conversion to sinus rhythm with second-degree heart block. Somewhat bradycardic. Amiodarone was discontinued. Currently on metoprolol succinate at 25 mg daily although has some bradycardia but no prolonged pauses. We will continue with  metoprolol however reduced to 12.5 mg daily. Will follow rhythm. Maintain magnesium greater than 2 and potassium greater than 4. Ejection fraction less than 30. Will need to consider AICD therapy. Patient is a somewhat difficult historian.  Will give clear liquids this morning and attempt to proceed with left cardiac cath today.   Cardiomyopathy-etiology unclear ischemic versus  idiopathic. Patient not an ideal candidate for surgical intervention. Unlikely single-vessel disease causing his symptoms however consideration for invasive evaluation will need to be discussed. We will discussed this at length with his primary cardiologist in the morning. We will continue with evidence-based beta-blocker at low dose. We will also add afterload reduction with lisinopril and spironolactone following hemodynamics. Will recheck BNP. Only minimally abnormal on admission.  Atrial fibrillation-off Eliquis for now due to possible invasive evaluation. We will continue with heparin and low-dose beta-blocker following rate and rhythm. Will need to discuss resuming Eliquis prior to discharge.  Peripheral vascular disease-status post endovascular stent for AAA. Being followed by vascular surgery as an outpatient.  Diabetes mellitus-somewhat hyperglycemic on presentation. This is improving.  Signed, Darlin Priestly Desmund Elman MD 04/08/2019, 7:35 AM  Pager: [phone]

## 2019-04-08 NOTE — Progress Notes (Signed)
Initial Nutrition Assessment  DOCUMENTATION CODES:   Underweight  INTERVENTION:   Recommend Nepro Shake po BID, each supplement provides 425 kcal and 19 grams protein  Recommend MVI daily   NUTRITION DIAGNOSIS:   Increased nutrient needs related to wound healing(wound healing) as evidenced by increased estimated needs.  GOAL:   Patient will meet greater than or equal to 90% of their needs  MONITOR:   PO intake, Supplement acceptance, Labs, Weight trends, Skin, I & O's  REASON FOR ASSESSMENT:   Other (Comment)(Low BMI)    ASSESSMENT:   78 y.o. male with medical history significant for type 2 diabetes, nicotine dependence, peripheral vascular disease with AAA, status post endovascular stent as well as history of atrial fibrillation on Eliquis, followed by cardiologist, Dr. Clayborn Bigness, who presented to the emergency room with a complaint of sharp nonradiating pain in the left parasternal area associated with nausea. Pt found to have tachycardia and NSTEMI   Unable to see patient today as pt in procedure at time of RD visit. Per chart review, pt eating 100% of meals in hospital. RD suspects pt with poor appetite and oral intake at baseline as pt is underweight and with wounds. RD will add supplements and vitamins to help pt meet his estimated needs. Per chart, pt appears fairly weight stable pta. RD will obtain nutrition related history and exam at follow up.   Medications reviewed and include: aspirin, colace, lasix, insulin, KCl, aldactone, heparin   Labs reviewed: K 3.3(L) BNP 744(H) cbgs- 94, 185 x 24 hrs AIC 7.8(H)- 12/12  Unable to complete Nutrition-Focused physical exam at this time.   Diet Order:   Diet Order            Diet clear liquid Room service appropriate? Yes; Fluid consistency: Thin  Diet effective 0500             EDUCATION NEEDS:   Not appropriate for education at this time  Skin:  Skin Assessment: Reviewed RN Assessment(Stage II buttocks, Stage  I sacrum)  Last BM:  12/12  Height:   Ht Readings from Last 1 Encounters:  04/06/19 6\' 3"  (1.905 m)    Weight:   Wt Readings from Last 1 Encounters:  04/08/19 64.7 kg    Ideal Body Weight:  89 kg  BMI:  Body mass index is 17.82 kg/m.  Estimated Nutritional Needs:   Kcal:  1900-2200kcal/day  Protein:  95-110g/day  Fluid:  >1.6L/day  Koleen Distance MS, RD, LDN Pager #- [phone] Office#- [phone] After Hours Pager: [phone]

## 2019-04-08 NOTE — Progress Notes (Signed)
Patient LOC is the same as it has always been.

## 2019-04-08 NOTE — Progress Notes (Signed)
PT Cancellation Note  Patient Details Name: Kurt [name] MRN: [id number] DOB: [birth year]   Cancelled Treatment:    Reason Eval/Treat Not Completed: Other (comment). Consult received and chart reviewed. Pt currently scheduled for cardiac cath this date. Will hold PT evaluation once medically stable.   Andi Mahaffy 04/08/2019, 10:20 AM Greggory Stallion, PT, DPT [phone]

## 2019-04-08 NOTE — Progress Notes (Signed)
ANTICOAGULATION CONSULT NOTE  Pharmacy Consult for Heparin Indication: chest pain/ACS  Allergies  Allergen Reactions  . Lipitor [Atorvastatin Calcium] Rash   Patient Measurements: Height: 6\' 3"  (190.5 cm) Weight: 142 lb 9.6 oz (64.7 kg) IBW/kg (Calculated) : 84.5 HEPARIN DW (KG): 67.4  Vital Signs: Temp: 98.5 F (36.9 C) (12/14 0514) Temp Source: Oral (12/14 0514) BP: 130/81 (12/14 0514) Pulse Rate: 54 (12/14 0514)  Labs: Recent Labs    04/05/19 1953 04/05/19 2241 04/06/19 0333 04/06/19 1144 04/06/19 2124 04/07/19 0534 04/07/19 0931 04/07/19 1045 04/08/19 0551  HGB  --   --  12.8*  --   --  11.9*  --   --  12.0*  HCT  --   --  36.7*  --   --  35.2*  --   --  33.4*  PLT  --   --  126*  --   --  118*  --   --  126*  APTT   < >  --  79* 62* 70* 76*  --   --   --   LABPROT  --   --  15.1  --   --   --   --   --   --   INR  --   --  1.2  --   --   --   --   --   --   HEPARINUNFRC  --   --   --   --   --  0.37  --   --  0.31  CREATININE  --   --  1.27*  --   --  1.06  --   --  1.17  TROPONINIHS  --  1,552*  --   --   --   --  12,437* 11,757*  --    < > = values in this interval not displayed.    Estimated Creatinine Clearance: 49.2 mL/min (by C-G formula based on SCr of 1.17 mg/dL).  Medical History: Past Medical History:  Diagnosis Date  . AAA (abdominal aortic aneurysm) (HCC)   . Diabetes mellitus without complication (HCC)     Medications:  Medications Prior to Admission  Medication Sig Dispense Refill Last Dose  . apixaban (ELIQUIS) 5 MG TABS tablet Take 1 tablet (5 mg total) by mouth 2 (two) times daily. 60 tablet 0 48+ hours at Unknown  . Cholecalciferol (D-5000) 5000 units TABS Take 5,000 Units by mouth daily.      . clopidogrel (PLAVIX) 75 MG tablet Take 1 tablet (75 mg total) by mouth daily. 30 tablet 0 48+ hours at Unknown  . co-enzyme Q-10 30 MG capsule Take 30 mg by mouth 3 (three) times daily.     04/10/19 docusate sodium (COLACE) 100 MG capsule Take 100  mg by mouth 2 (two) times daily.     Marland Kitchen donepezil (ARICEPT) 5 MG tablet Take 1 tablet (5 mg total) by mouth at bedtime. 30 tablet 0 48+ hours at Unknown  . Garlic Oil 1000 MG CAPS Take 1,000 mg by mouth daily as needed (cholesterol).    Unknown at PRN  . glimepiride (AMARYL) 2 MG tablet Take 2 mg by mouth 2 (two) times daily with a meal.   48+ hours at Unknown  . lisinopril (ZESTRIL) 2.5 MG tablet Take 2.5 mg by mouth daily.   48+ hours at Unknown  . metFORMIN (GLUCOPHAGE) 500 MG tablet Take 1,000 mg by mouth 2 (two) times daily with a meal.   48+ hours  at Unknown  . metoprolol succinate (TOPROL-XL) 50 MG 24 hr tablet Take 25 mg by mouth daily. Take with or immediately following a meal.     . Misc Natural Products (PROSTATE THERAPY COMPLEX PO) Take 562 mg by mouth daily.      . Red Yeast Rice 600 MG CAPS Take 1 capsule by mouth daily.      . vitamin B-12 (CYANOCOBALAMIN) 1000 MCG tablet Take 1,000 mcg by mouth daily.        Assessment: Pt is on Apixaban 5mg  BID PTA.  Baseline labs ordered.  Pharmacy asked to initiate and monitor Heparin for ACS.  Will use aPTT to manage Heparin.  Pt is on IV amiodarone.    12/12 1144 APTT 62 - rate increased to 900 units/hr  Goal of Therapy:  aPTT 66-102 seconds Monitor platelets by anticoagulation protocol: Yes   Plan:  12/12 2124 APTT 70 therapeutic. Will continue heparin drip @ 900 units/hr.  12/13 0534 HL 0.37, aPTT 76, therapeutic.  HL and aPTT correlate.  Continue current rate, will now use HL to manage Heparin.  CBC slightly worse. 12/14 0551 HL 0.31, therapeutic.  CBC stable.  Continue current rate  CBC/HL with am labs   Ena Dawley, PharmD Clinical Pharmacist 04/08/2019 6:54 AM

## 2019-04-09 ENCOUNTER — Inpatient Hospital Stay: Payer: Medicare Other

## 2019-04-09 ENCOUNTER — Encounter: Payer: Self-pay | Admitting: Cardiology

## 2019-04-09 DIAGNOSIS — G9341 Metabolic encephalopathy: Secondary | ICD-10-CM

## 2019-04-09 LAB — BASIC METABOLIC PANEL
Anion gap: 10 (ref 5–15)
BUN: 15 mg/dL (ref 8–23)
CO2: 23 mmol/L (ref 22–32)
Calcium: 8.8 mg/dL — ABNORMAL LOW (ref 8.9–10.3)
Chloride: 108 mmol/L (ref 98–111)
Creatinine, Ser: 0.96 mg/dL (ref 0.61–1.24)
GFR calc Af Amer: >60 mL/min (ref >60)
GFR calc non Af Amer: >60 mL/min (ref >60)
Glucose, Bld: 149 mg/dL — ABNORMAL HIGH (ref 70–99)
Potassium: 4.1 mmol/L (ref 3.5–5.1)
Sodium: 141 mmol/L (ref 135–145)

## 2019-04-09 LAB — SEDIMENTATION RATE: Sed Rate: 34 mm/hr — ABNORMAL HIGH (ref 0–20)

## 2019-04-09 LAB — FOLATE: Folate: 22 ng/mL (ref >5.9)

## 2019-04-09 LAB — CBC
HCT: 36.9 % — ABNORMAL LOW (ref 39.0–52.0)
Hemoglobin: 13 g/dL (ref 13.0–17.0)
MCH: 32.7 pg (ref 26.0–34.0)
MCHC: 35.2 g/dL (ref 30.0–36.0)
MCV: 92.7 fL (ref 80.0–100.0)
Platelets: 135 10*3/uL — ABNORMAL LOW (ref 150–400)
RBC: 3.98 MIL/uL — ABNORMAL LOW (ref 4.22–5.81)
RDW: 13.2 % (ref 11.5–15.5)
WBC: 5.6 10*3/uL (ref 4.0–10.5)
nRBC: 0 % (ref 0.0–0.2)

## 2019-04-09 LAB — GLUCOSE, CAPILLARY
Glucose-Capillary: 147 mg/dL — ABNORMAL HIGH (ref 70–99)
Glucose-Capillary: 234 mg/dL — ABNORMAL HIGH (ref 70–99)
Glucose-Capillary: 171 mg/dL — ABNORMAL HIGH (ref 70–99)
Glucose-Capillary: 179 mg/dL — ABNORMAL HIGH (ref 70–99)
Glucose-Capillary: 200 mg/dL — ABNORMAL HIGH (ref 70–99)

## 2019-04-09 LAB — HEPARIN LEVEL (UNFRACTIONATED): Heparin Unfractionated: <0.1 IU/mL — ABNORMAL LOW (ref 0.30–0.70)

## 2019-04-09 LAB — VITAMIN B12: Vitamin B-12: 610 pg/mL (ref 180–914)

## 2019-04-09 MED ORDER — ADULT MULTIVITAMIN W/MINERALS CH
1.0000 | ORAL_TABLET | Freq: Every day | ORAL | Status: DC
  Administered 2019-04-10 – 2019-04-12 (×3): 1 via ORAL
  Filled 2019-04-09 (×3): qty 1

## 2019-04-09 MED ORDER — APIXABAN 5 MG PO TABS: 5.0000 mg | ORAL_TABLET | Freq: Two times a day (BID) | ORAL | Status: DC

## 2019-04-09 MED ORDER — NEPRO/CARBSTEADY PO LIQD
237.0000 mL | Freq: Two times a day (BID) | ORAL | Status: DC
  Administered 2019-04-10 – 2019-04-12 (×5): 237 mL via ORAL

## 2019-04-09 MED ORDER — GLIMEPIRIDE 2 MG PO TABS
2.0000 mg | ORAL_TABLET | Freq: Every day | ORAL | Status: DC
  Administered 2019-04-09 – 2019-04-12 (×4): 2 mg via ORAL
  Filled 2019-04-09 (×4): qty 1

## 2019-04-09 MED ORDER — APIXABAN 5 MG PO TABS
5.0000 mg | ORAL_TABLET | Freq: Two times a day (BID) | ORAL | Status: DC
  Administered 2019-04-09 – 2019-04-11 (×6): 5 mg via ORAL
  Filled 2019-04-09 (×6): qty 1

## 2019-04-09 MED ORDER — AMIODARONE HCL 200 MG PO TABS
200.0000 mg | ORAL_TABLET | Freq: Two times a day (BID) | ORAL | Status: DC
  Administered 2019-04-09 – 2019-04-11 (×6): 200 mg via ORAL
  Filled 2019-04-09 (×6): qty 1

## 2019-04-09 NOTE — Evaluation (Signed)
Physical Therapy Evaluation Patient Details Name: Kurt [name] MRN: [id number] DOB: 1940/08/12 Today's Date: 04/09/2019   History of Present Illness  78 yo male with history of probable ischemic cardiomyopathy admitted with wide-complex tachycardia converted to sinus rhythm.  Appeared to be probable ventricular tachycardia.  Patient ruled in for non-ST elevation myocardial infarction.  He has remained hemodynamically stable with no further sustained arrhythmia.  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  Left system had severe calcification.  Recommendation for medical management..   Patient is a poor surgical candidate. DUe to acute neuro deficits, stat CT performed this date, negative at this time. Pending neuro consult  Clinical Impression  Pt is a pleasant 78 year old male who was admitted for tachycardia and NSTEMI, s/p cath yesterday. Pt performs bed mobility with min assist and transfers/ambulation with mod assist and light use of RW. Poor standing tolerance secondary to balance. Pt demonstrates deficits with strength/mobility/balance. Phone rang during session and pt didn't recognize phone and when given to use, incorrect use. Unable to ambulate to recliner this date, doesn't appear at baseline level. Concerned for home safety as pt is high risk for falls. Would benefit from skilled PT to address above deficits and promote optimal return to PLOF; recommend transition to STR upon discharge from acute hospitalization.     Follow Up Recommendations SNF    Equipment Recommendations  Rolling walker with 5" wheels    Recommendations for Other Services       Precautions / Restrictions Precautions Precautions: Fall;Other (comment) Precaution Comments: watch HR, target HR 65-105; cardiac cath on 12/14 - no  push/pull/lifting for 48 hours, elevate wrist at or above heart at rest for 24hrs Restrictions Weight Bearing Restrictions: No Other Position/Activity Restrictions: RUE no pushing, pulling, or lifting for 48hrs      Mobility  Bed Mobility Overal bed mobility: Needs Assistance Bed Mobility: Supine to Sit     Supine to sit: Min assist     General bed mobility comments: need cues for sequencing and initiating movement. Once seated at EOB, able to sit with upright posture safely  Transfers Overall transfer level: Needs assistance Equipment used: Rolling walker (2 wheeled) Transfers: Sit to/from Stand Sit to Stand: Mod assist         General transfer comment: has difficulty from low bed. Once standing, light touch on RW, no WBing noted through B UE as weight is shifted post. Unable to maintain upright standing and abruptly returns to sitting on bed. 2nd attempt with pt only able to stand close to 1 min prior to fatigue.   Ambulation/Gait Ambulation/Gait assistance: Mod assist Gait Distance (Feet): 2 Feet Assistive device: Rolling walker (2 wheeled) Gait Pattern/deviations: Step-to pattern     General Gait Details: 2 small steps towards HOB standing at EOB. Poor tolerance for gait, unable to step away from bed due to post lean.  Stairs            Wheelchair Mobility    Modified Rankin (Stroke Patients Only)       Balance Overall balance assessment: Needs assistance Sitting-balance support: Single extremity supported;Feet supported Sitting balance-Leahy Scale: Good     Standing balance support: Bilateral upper extremity supported(lightly supported) Standing balance-Leahy Scale: Poor Standing balance comment: post leaning, unable to maintain upright posture  Pertinent Vitals/Pain Pain Assessment: No/denies pain    Home Living Family/patient expects to be discharged to:: Private residence Living Arrangements:  (friend) Available Help at Discharge: Friend(s)(Kurt [name] lives with him, but isn't available 24/7) Type of Home: House Home Access: Level entry     Home Layout: One level Home Equipment: Environmental consultant - 2 wheels(hurricane 3 foot cane)      Prior Function Level of Independence: Independent with assistive device(s)         Comments: Pt reports ambulating wiht 3pt cane, indep with ADL, driving ("but not like I used to"); per chart, pt non-compliant with medication     Hand Dominance        Extremity/Trunk Assessment   Upper Extremity Assessment Upper Extremity Assessment: Generalized weakness(B UE grossly 4/5) RUE Deficits / Details: cardiac cath on 12/14, radial precautions    Lower Extremity Assessment Lower Extremity Assessment: Generalized weakness(B LE grossly 3+/5)       Communication   Communication: No difficulties  Cognition Arousal/Alertness: Awake/alert Behavior During Therapy: Flat affect Overall Cognitive Status: Impaired/Different from baseline Area of Impairment: Orientation;Attention;Memory;Following commands;Safety/judgement;Awareness;Problem solving                               General Comments: able to identify most familiar objects except for phone, incorrect use of phone. Poor safety awareness      General Comments      Exercises Other Exercises Other Exercises: supine ther-ex performed on B LE including AP, SLRs, hip abd/add, and heel slides. All ther-ex performed x 10 reps with cga. Safe technique performed with pt able to follow commands. Reports difficulty and "heaviness" with exertion.   Assessment/Plan    PT Assessment Patient needs continued PT services  PT Problem List Decreased strength;Decreased balance;Decreased mobility;Decreased knowledge of use of DME;Decreased cognition;Decreased activity tolerance       PT Treatment Interventions Gait training;DME instruction;Therapeutic exercise;Balance training    PT Goals (Current  goals can be found in the Care Plan section)  Acute Rehab PT Goals Patient Stated Goal: go home PT Goal Formulation: With patient Time For Goal Achievement: 04/23/19 Potential to Achieve Goals: Good    Frequency Min 2X/week   Barriers to discharge        Co-evaluation               AM-PAC PT "6 Clicks" Mobility  Outcome Measure Help needed turning from your back to your side while in a flat bed without using bedrails?: A Little Help needed moving from lying on your back to sitting on the side of a flat bed without using bedrails?: A Little Help needed moving to and from a bed to a chair (including a wheelchair)?: A Lot Help needed standing up from a chair using your arms (e.g., wheelchair or bedside chair)?: A Lot Help needed to walk in hospital room?: Total Help needed climbing 3-5 steps with a railing? : Total 6 Click Score: 12    End of Session Equipment Utilized During Treatment: Gait belt Activity Tolerance: Patient tolerated treatment well Patient left: in bed;with bed alarm set Nurse Communication: Mobility status PT Visit Diagnosis: Unsteadiness on feet (R26.81);Muscle weakness (generalized) (M62.81);Difficulty in walking, not elsewhere classified (R26.2)    Time: 3545-6256 PT Time Calculation (min) (ACUTE ONLY): 19 min   Charges:   PT Evaluation $PT Eval Low Complexity: 1 Low PT Treatments $Therapeutic Exercise: 8-22 mins        Elizabeth Palau, PT,  DPT [phone]   Misty Foutz 04/09/2019, 3:45 PM

## 2019-04-09 NOTE — Progress Notes (Signed)
Patient Name: Kurt [name] Date of Encounter: 04/09/2019  Hospital Problem List     Principal Problem:   Wide-complex tachycardia [facility](HCC) Active Problems:   Tobacco dependence   Abdominal aortic aneurysm without rupture (HCC)   PVD (peripheral vascular disease) (HCC)   Rapid atrial fibrillation (HCC)   RBBB   Hyperglycemia due to type 2 diabetes mellitus (HCC)   Acute on chronic diastolic CHF (congestive heart failure) (HCC)   NSTEMI (non-ST elevated myocardial infarction) (HCC)   Atrial flutter with rapid ventricular response (HCC)   Atrial fibrillation, chronic (HCC)   Decubitus ulcer of sacral region, stage 2 [facility](HCC)    Patient Profile     78 year old male with history of probable ischemic cardiomyopathy admitted with wide-complex tachycardia converted to sinus rhythm.  Appeared to be probable ventricular tachycardia.  Patient ruled in for non-ST elevation myocardial infarction.  He has remained hemodynamically stable with no further sustained arrhythmia.  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  Left system had severe calcification.  Recommendation for medical management..   Patient is a poor surgical candidate.  Subjective   Doing well post cardiac cath  Inpatient Medications    . aspirin EC  81 mg Oral Daily  . docusate sodium  100 mg Oral BID  . donepezil  5 mg Oral Daily  . furosemide  20 mg Intravenous Once  . furosemide  20 mg Oral Daily  . insulin aspart  0-5 Units Subcutaneous QHS  . insulin aspart  0-9 Units Subcutaneous TID WC  . lisinopril  5 mg Oral Daily  . metoprolol succinate  12.5 mg Oral Daily  . potassium chloride  20 mEq Oral BID  . sodium chloride flush  3 mL Intravenous Q12H  . sodium chloride flush  3 mL Intravenous Q12H  . spironolactone  12.5 mg Oral Daily     Vital Signs    Vitals:   04/08/19 1850 04/08/19 2238 04/09/19 0501 04/09/19 0744  BP: (!) 118/58 (!) 153/83 133/67 (!) 141/88  Pulse: (!) 39 62 65 70  Resp: 15 20 20 16   Temp:  97.7 F (36.5 C) 98.4 F (36.9 C) 98.6 F (37 C)  TempSrc:  Oral Oral Oral  SpO2: 99% 97% 96% 97%  Weight:   67 kg   Height:        Intake/Output Summary (Last 24 hours) at 04/09/2019 0802 Last data filed at 04/09/2019 0400 Gross per 24 hour  Intake 1105.36 ml  Output 500 ml  Net 605.36 ml   Filed Weights   04/07/19 0345 04/08/19 0514 04/09/19 0501  Weight: 67.7 kg 64.7 kg 67 kg    Physical Exam    GEN: Well nourished, well developed, in no acute distress.  HEENT: normal.  Neck: Supple, no JVD, carotid bruits, or masses. Cardiac: RRR, no murmurs, rubs, or gallops. No clubbing, cyanosis, edema.  Radials/DP/PT 2+ and equal bilaterally.  Respiratory:  Respirations regular and unlabored, clear to auscultation bilaterally. GI: Soft, nontender, nondistended, BS + x 4. MS: no deformity or atrophy. Skin: warm and dry, no rash. Neuro:  Strength and sensation are intact. Psych: Normal affect.  Labs    CBC Recent Labs    04/08/19 0551 04/09/19 0559  WBC 6.3 5.6  HGB 12.0* 13.0  HCT 33.4* 36.9*  MCV 92.0 92.7  PLT 126* 135*  Basic Metabolic Panel Recent Labs    04/08/19 0551 04/09/19 0559  NA 139 141  K 3.3* 4.1  CL 108 108  CO2 22 23  GLUCOSE 101* 149*  BUN 21 15  CREATININE 1.17 0.96  CALCIUM 8.4* 8.8*   Liver Function Tests No results for input(s): AST, ALT, ALKPHOS, BILITOT, PROT, ALBUMIN in the last 72 hours. No results for input(s): LIPASE, AMYLASE in the last 72 hours. Cardiac Enzymes No results for input(s): CKTOTAL, CKMB, CKMBINDEX, TROPONINI in the last 72 hours. BNP Recent Labs    04/07/19 0931 04/08/19 0551  BNP 1,131.0* 744.0*   D-Dimer No results for input(s): DDIMER in the last 72 hours. Hemoglobin A1C Recent Labs    04/06/19 1144  HGBA1C 7.8*    Fasting Lipid Panel No results for input(s): CHOL, HDL, LDLCALC, TRIG, CHOLHDL, LDLDIRECT in the last 72 hours. Thyroid Function Tests No results for input(s): TSH, T4TOTAL, T3FREE, THYROIDAB in the last 72 hours.  Invalid input(s): FREET3  Telemetry    Sinus rhythm  ECG    Wide complex tachycardia. nsr  Radiology    CARDIAC CATHETERIZATION  Result Date: 04/08/2019  Ost RCA to Prox RCA lesion is 100% stenosed with 100% stenosed side branch in RV Branch.  Prox Cx lesion is 50% stenosed.  Mid LM lesion is 60% stenosed.  1st Diag lesion is 80% stenosed.  Mid LAD lesion is 60% stenosed.  Ost LAD to Prox LAD lesion is 50% stenosed.  Left ventriculogram was not performed unable to cross into the ventricle  Conclusion Multivessel coronary disease including totally occluded proximal to mid right and appears to be a CTO in size vessel Large left main with distal 60% lesion Large LAD with moderate disease 50 to 60% Diagonal 1 with 80% ostial lesion Circumflex was medium in size and only had moderate disease Faint collaterals left to distal right Proximal left system had moderate to severe calcification Recommend medical therapy   DG Chest Portable 1 View  Result Date: 04/05/2019 CLINICAL DATA:  Atrial fibrillation and chest pain EXAM: PORTABLE CHEST 1 VIEW COMPARISON:  02/25/2018 FINDINGS: Cardiac shadow is enlarged but stable. Aortic calcifications are again seen. Vascular congestion and interstitial edema is noted. No sizable effusion is seen. No focal infiltrate is noted. IMPRESSION: Changes consistent with congestive failure. Electronically Signed   By: Inez Catalina M.D.   On: 04/05/2019 23:42   ECHOCARDIOGRAM COMPLETE  Result Date: 04/06/2019   ECHOCARDIOGRAM REPORT   Patient Name:   Kurt [name] Date of Exam: 04/06/2019 Medical Rec #:  [id number]      Height:       75.0 in Accession #:    [id number]     Weight:       155.0 lb Date of Birth:  [birth year]      BSA:          1.97 m  Patient Age:    16 years       BP:           110/59 mmHg Patient Gender: M              HR:           47 bpm. Exam Location:  [facility] Procedure: 2D Echo and Intracardiac Opacification Agent Indications:     NSTEMI  History:         Patient has prior history of Echocardiogram examinations. CHF,  Arrythmias:Atrial Fibrillation; Risk Factors:Diabetes and                  Current Smoker.  Sonographer:     LTM Referring Phys:  [id number] Andris Baumann Diagnosing Phys: Harold Hedge MD IMPRESSIONS  1. Left ventricular ejection fraction, by visual estimation, is 20 to 25%. The left ventricle has severely decreased function. Left ventricular septal wall thickness was normal. Normal left ventricular posterior wall thickness. There is no left ventricular hypertrophy.  2. Definity contrast agent was given IV to delineate the left ventricular endocardial borders.  3. Mildly dilated left ventricular internal cavity size.  4. The left ventricle demonstrates global hypokinesis.  5. Global right ventricle has normal systolic function.The right ventricular size is normal. No increase in right ventricular wall thickness.  6. Left atrial size was normal.  7. Right atrial size was mildly dilated.  8. The mitral valve is grossly normal. Mild to moderate mitral valve regurgitation. No evidence of mitral stenosis.  9. The tricuspid valve is not well visualized. Tricuspid valve regurgitation is trivial. 10. The aortic valve was not well visualized. Aortic valve regurgitation is trivial. 11. The pulmonic valve was not well visualized. Pulmonic valve regurgitation is trivial. 12. Mildly elevated pulmonary artery systolic pressure. 13. The atrial septum is grossly normal. FINDINGS  Left Ventricle: Left ventricular ejection fraction, by visual estimation, is 20 to 25%. The left ventricle has severely decreased function. Definity contrast agent was given IV to delineate the left ventricular endocardial borders. The left ventricle  demonstrates global hypokinesis. The left ventricular internal cavity size was mildly dilated left ventricle. Normal left ventricular posterior wall thickness. There is no left ventricular hypertrophy. Right Ventricle: The right ventricular size is normal. No increase in right ventricular wall thickness. Global RV systolic function is has normal systolic function. The tricuspid regurgitant velocity is 2.49 m/s, and with an assumed right atrial pressure  of 10 mmHg, the estimated right ventricular systolic pressure is mildly elevated at 34.8 mmHg. Left Atrium: Left atrial size was normal in size. Right Atrium: Right atrial size was mildly dilated Pericardium: There is no evidence of pericardial effusion. Mitral Valve: The mitral valve is grossly normal. Mild to moderate mitral valve regurgitation. No evidence of mitral valve stenosis by observation. Tricuspid Valve: The tricuspid valve is not well visualized. Tricuspid valve regurgitation is trivial. Aortic Valve: The aortic valve was not well visualized. Aortic valve regurgitation is trivial. Aortic valve mean gradient measures 3.0 mmHg. Aortic valve peak gradient measures 5.0 mmHg. Aortic valve area, by VTI measures 1.37 cm. Pulmonic Valve: The pulmonic valve was not well visualized. Pulmonic valve regurgitation is trivial. Pulmonic regurgitation is trivial. Aorta: The aortic root is normal in size and structure. IAS/Shunts: The atrial septum is grossly normal.  LEFT VENTRICLE PLAX 2D LVIDd:         4.70 cm       Diastology LVIDs:         4.34 cm       LV e' lateral:   4.62 cm/s LV PW:         0.75 cm       LV E/e' lateral: 18.1 LV IVS:        1.27 cm       LV e' medial:    4.13 cm/s LVOT diam:     1.70 cm       LV E/e' medial:  20.2 LV SV:         17 ml  LV SV Index:   9.12 LVOT Area:     2.27 cm  LV Volumes (MOD) LV area d, A2C:    32.90 cm LV area d, A4C:    36.90 cm LV area s, A2C:    26.70 cm LV area s, A4C:    26.00 cm LV major d, A2C:   8.08 cm LV major  d, A4C:   8.42 cm LV major s, A2C:   7.58 cm LV major s, A4C:   7.21 cm LV vol d, MOD A2C: 110.0 ml LV vol d, MOD A4C: 132.0 ml LV vol s, MOD A2C: 76.0 ml LV vol s, MOD A4C: 75.1 ml LV SV MOD A2C:     34.0 ml LV SV MOD A4C:     132.0 ml LV SV MOD BP:      45.5 ml RIGHT VENTRICLE RV S prime:     8.92 cm/s TAPSE (M-mode): 2.4 cm LEFT ATRIUM             Index LA diam:        1.70 cm 0.86 cm/m LA Vol (A2C):   58.7 ml 29.81 ml/m LA Vol (A4C):   42.3 ml 21.48 ml/m LA Biplane Vol: 50.8 ml 25.80 ml/m  AORTIC VALVE AV Area (Vmax):    1.29 cm AV Area (Vmean):   1.09 cm AV Area (VTI):     1.37 cm AV Vmax:           112.00 cm/s AV Vmean:          80.500 cm/s AV VTI:            0.236 m AV Peak Grad:      5.0 mmHg AV Mean Grad:      3.0 mmHg LVOT Vmax:         63.50 cm/s LVOT Vmean:        38.800 cm/s LVOT VTI:          0.142 m LVOT/AV VTI ratio: 0.60  AORTA Ao Root diam: 3.60 cm MITRAL VALVE                         TRICUSPID VALVE MV Area (PHT): 2.34 cm              TR Peak grad:   24.8 mmHg MV PHT:        94.00 msec            TR Vmax:        284.00 cm/s MV E velocity: 83.40 cm/s  103 cm/s MV A velocity: 130.00 cm/s 70.3 cm/s SHUNTS MV E/A ratio:  0.64        1.5       Systemic VTI:  0.14 m                                      Systemic Diam: 1.70 cm  Harold Hedge MD Electronically signed by Harold Hedge MD Signature Date/Time: 04/06/2019/2:25:05 PM    Final     Assessment & Plan     78 year old male with diabetes, tobacco abuse, peripheral vascular disease status post endovascular stent for AAA, history of atrial fibrillation and an known ejection fraction of 25% presented with wide-complex tachycardia. Had some chest heaviness during this episode. He is currently in sinus rhythm with intermittent second-degree heart block.  Ruled in for non-ST elevation  myocardial infarction   Wide-complex tachycardia cardia-probable VT although less likely, could have been aberrant atrial fibrillation. Received amiodarone  with conversion to sinus rhythm with second-degree heart block. Somewhat bradycardic.. Currently on metoprolol succinate at 25 mg daily although has some bradycardia but no prolonged pauses. We will continue with metoprolol however reduced to 12.5 mg daily. Will follow rhythm. Maintain magnesium greater than 2 and potassium greater than 4. Ejection fraction less than 30. Will need to consider AICD therapy. Patient is a somewhat difficult historian.  Cardiac cath revealed diffuse coronary artery disease.  Poor candidate for coronary bypass grafting.  Heavily calcified vessels limiting PCI approach.  Will attempt medical management. Will resume amiodarone at 200 mg bid.   Cardiomyopathy-etiology unclear ischemic versus idiopathic. Patient not an ideal candidate for surgical intervention.  Likely ischemic in nature.  Will attempt medical management with evidence-based heart failure treatment.  After 40 days, consideration for ICD.  We will continue with metoprolol succinate, lisinopril and spironolactone.    Atrial fibrillation-we will need to convert back to Eliquis after further discussion as out patient. Will remain off of eliquis and discontinue heparin for now.  Peripheral vascular disease-status post endovascular stent for AAA. Being followed by vascular surgery as an outpatient.  Diabetes mellitus-somewhat hyperglycemic on presentation. This is improving.  Signed, Darlin Priestly Tejay Hubert MD 04/09/2019, 8:02 AM  Pager: [phone]

## 2019-04-09 NOTE — Evaluation (Addendum)
Occupational Therapy Evaluation Patient Details Name: Kurt [name] MRN: [id number] DOB: 1940/07/29 Today's Date: 04/09/2019    History of Present Illness 78 yo male with history of probable ischemic cardiomyopathy admitted with wide-complex tachycardia converted to sinus rhythm.  Appeared to be probable ventricular tachycardia.  Patient ruled in for non-ST elevation myocardial infarction.  He has remained hemodynamically stable with no further sustained arrhythmia.  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  Left system had severe calcification.  Recommendation for medical management..   Patient is a poor surgical candidate.   Clinical Impression   Pt seen for OT evaluation this date. Per pt prior to hospital admission, pt was indep with ADL, ambulating with 3-pt cane. Pt reports that he is independent in med mgt, but chart indicates hx of non-compliance. Pt reports living with "my friend Lanora Manis" for the past 30 years. Pt oriented to self and initially states he is at the hospital for a heart attack. Later reports he is at a rest home and unclear why he is here. Reports year is 52 and it's November. Pt reports living in 1 story home with level entry. No family present to support/verify pt's account. Pt noted during assessment to easily attend to L side and to midline, but requires verbal cues throughout to attend to R side. Attends to L side of tray improving with repeated instruction to encourage visual attention to R side. Pt able to perform grooming at midline with BUE with set up of wipe to wash hands. Pt demo's decreased strength (difficult to assess focal weakness 2/2 cognition) globally, requires Max A for bed mobility and lateral scoots to improve positioning prior to self feeding attempts. Pt initially reports  blurry and double vision, but then later denies. Unable to read near/far or large/small print. <30% success at identifying familiar objects (Bible, fork, spoon, milk, pepper packet, book, phone). Required Max A to use phone when someone called his room. Decreased problem solving, initiation, sequencing, memory, orientation, easily distracted. RN notified of deficits noted and sent message to MD via secure chat. HR 60's to 70's during session. Pt would benefit from skilled OT to address noted impairments and functional limitations (see below for any additional details) in order to maximize safety and independence while minimizing falls risk and caregiver burden.  Upon hospital discharge, recommend pt discharge to SNF.    Follow Up Recommendations  SNF    Equipment Recommendations  3 in 1 bedside commode    Recommendations for Other Services       Precautions / Restrictions Precautions Precautions: Fall;Other (comment) Precaution Comments: watch HR, target HR 65-105; cardiac cath on 12/14 - no push/pull/lifting for 48 hours, elevate wrist at or above heart at rest for 24hrs Restrictions Other Position/Activity Restrictions: RUE no pushing, pulling, or lifting for 48hrs      Mobility Bed Mobility Overal bed mobility: Needs Assistance Bed Mobility: Supine to Sit;Sit to Supine     Supine to sit: Max assist;HOB elevated Sit to supine: Max assist   General bed mobility comments: cues for sequencing  Transfers Overall transfer level: Needs assistance Equipment used: None Transfers: Lateral/Scoot Transfers          Lateral/Scoot Transfers: Max assist      Balance Overall balance assessment: Needs assistance Sitting-balance support: Single extremity supported;Feet supported Sitting balance-Leahy Scale: Poor Sitting balance -  Comments: LOB posteriorly initially requiring min-mod assist to correct, cues with improved sitting balance to CGA Postural control: Posterior lean                                  ADL either performed or assessed with clinical judgement   ADL Overall ADL's : Needs assistance/impaired Eating/Feeding: Set up;Cueing for sequencing Eating/Feeding Details (indicate cue type and reason): pt received nearly supine with tray in front of him; after repositioning for optimal safety, pt initially attempted to eat with L non-dom hand, unable to identify utensils, Bible on tray table, visually not attending to R side of tray without verbal and tactile cues Grooming: Bed level;Set up;Wash/dry hands Grooming Details (indicate cue type and reason): cues to initiate, set up was able to wipe hands prior to breakfast Upper Body Bathing: Bed level;Moderate assistance   Lower Body Bathing: Bed level;Maximal assistance   Upper Body Dressing : Moderate assistance;Bed level   Lower Body Dressing: Bed level;Maximal assistance     Toilet Transfer Details (indicate cue type and reason): log roll in bed after lateral scoots in bed to better position prior to linens change for incontinence, unsafe to attempt stand                 Vision Baseline Vision/History: Wears glasses Wears Glasses: At all times Patient Visual Report: Blurring of vision;Diplopia(pt reports deficits but then later denies, but has difficulty reading large and small print, even word "Bible" on the cover of his Bible) Vision Assessment?: Yes Eye Alignment: Within Functional Limits Ocular Range of Motion: Other (comment)(difficult initially with going past midline to R side, cues to encourage) Alignment/Gaze Preference: Gaze left;Head turned Tracking/Visual Pursuits: Impaired - to be further tested in functional context     Perception     Praxis      Pertinent Vitals/Pain Pain Assessment: No/denies pain     Hand Dominance Right   Extremity/Trunk Assessment Upper Extremity Assessment Upper Extremity Assessment: Generalized weakness;RUE deficits/detail RUE Deficits /  Details: cardiac cath on 12/14, radial precautions   Lower Extremity Assessment Lower Extremity Assessment: Generalized weakness       Communication Communication Communication: No difficulties   Cognition Arousal/Alertness: Awake/alert Behavior During Therapy: Flat affect Overall Cognitive Status: Impaired/Different from baseline Area of Impairment: Orientation;Attention;Memory;Following commands;Safety/judgement;Awareness;Problem solving                 Orientation Level: Disoriented to;Place;Time Current Attention Level: Focused Memory: Decreased short-term memory Following Commands: Follows one step commands inconsistently Safety/Judgement: Decreased awareness of deficits;Decreased awareness of safety Awareness: Intellectual Problem Solving: Slow processing;Requires verbal cues;Difficulty sequencing General Comments: difficulty with identifying familiar objects, initiates self feeding with non-dom L hand, decreased attention to R side   General Comments       Exercises Other Exercises Other Exercises: Pt instructed in visual scanning strategies and repeated visual attention to R side to improve access to meal tray and improve attention, with cues, pt uses dom R hand to hold coffee cup after set up, difficulty with initiation and sequencing, requiring set up of creamer and sugar for coffee, when asked to stir coffee with spoon and visual cues for locating spoon, pt begins to stir coffee with fork in his R hand   Shoulder Instructions      Home Living Family/patient expects to be discharged to:: Private residence Living Arrangements: Spouse/significant other Available Help at Discharge: Family(pt reports "friend [place]" who he's  been with for 6646yrs could help but not 24/7) Type of Home: House Home Access: Level entry(per chart, ramped entrance, but pt reports level entry)     Home Layout: One level     Bathroom Shower/Tub: Chief Strategy OfficerTub/shower unit   Bathroom Toilet:  Standard     Home Equipment: Other (comment)   Additional Comments: pt reports 3-pt cane      Prior Functioning/Environment Level of Independence: Independent with assistive device(s)        Comments: Pt reports ambulating wiht 3pt cane, indep with ADL, driving ("but not like I used to"); per chart, pt non-compliant with medication        OT Problem List: Decreased strength;Decreased cognition;Decreased safety awareness;Impaired vision/perception;Impaired balance (sitting and/or standing)      OT Treatment/Interventions: Self-care/ADL training;Therapeutic exercise;Therapeutic activities;Neuromuscular education;Cognitive remediation/compensation;DME and/or AE instruction;Patient/family education;Visual/perceptual remediation/compensation;Balance training    OT Goals(Current goals can be found in the care plan section) Acute Rehab OT Goals Patient Stated Goal: go home OT Goal Formulation: With patient Time For Goal Achievement: 04/23/19 Potential to Achieve Goals: Good ADL Goals Pt Will Perform Lower Body Dressing: sit to/from stand;with min assist;with mod assist Pt Will Transfer to Toilet: with mod assist;with max assist;stand pivot transfer;bedside commode Additional ADL Goal #1: Pt will locate and access 100% of tray during meal with PRN verbal cues to visually attend to R side.  OT Frequency: Min 2X/week   Barriers to D/C:            Co-evaluation              AM-PAC OT "6 Clicks" Daily Activity     Outcome Measure Help from another person eating meals?: A Little Help from another person taking care of personal grooming?: A Little Help from another person toileting, which includes using toliet, bedpan, or urinal?: A Lot Help from another person bathing (including washing, rinsing, drying)?: A Lot Help from another person to put on and taking off regular upper body clothing?: A Lot Help from another person to put on and taking off regular lower body clothing?: A  Lot 6 Click Score: 14   End of Session Nurse Communication: Other (comment)(deficits noted during evaluation)  Activity Tolerance: Patient tolerated treatment well Patient left: in bed;with call bell/phone within reach;with bed alarm set  OT Visit Diagnosis: Other abnormalities of gait and mobility (R26.89);Muscle weakness (generalized) (M62.81);Other symptoms and signs involving cognitive function;Low vision, both eyes (H54.2)                Time: [id number] OT Time Calculation (min): 38 min Charges:  OT General Charges $OT Visit: 1 Visit OT Evaluation $OT Eval Moderate Complexity: 1 Mod OT Treatments $Therapeutic Activity: 8-22 mins $Neuromuscular Re-education: 8-22 mins  Richrd PrimeJamie Stiller, MPH, MS, OTR/L ascom [phone] 04/09/19, 11:25 AM

## 2019-04-09 NOTE — Progress Notes (Signed)
ewg completed

## 2019-04-09 NOTE — Progress Notes (Signed)
Made dr. Leslye Peer aware noticed patient stares off in a gaze to the left, does respond when spoken to. Disoriented to time and situation. Patient does use all extremities, however at times unable to identify familiar objects such as fork or bible.

## 2019-04-09 NOTE — Progress Notes (Signed)
Heart rate ranging 40-mid 50. Per dr. Ubaldo Glassing okay to give scheduled amiodarone, hold metroprolol 12.5mg  po. Will continue to monitor

## 2019-04-09 NOTE — Procedures (Signed)
ELECTROENCEPHALOGRAM REPORT   Patient: Kurt [name]       Room #: 248A-AA EEG No. ID: [id number] Age: 78 y.o.        Sex: male Referring Physician: Leslye Peer Report Date:  04/09/2019        Interpreting Physician: Alexis Goodell  History: Kurt [name] is an 78 y.o. male s/p a starring episode  Medications:  Pacerone, Eliquis, ASA, Aricept, Lasix, Amaryl, Insulin, Zestril, Metoprolol, Spironolactone  Conditions of Recording:  This is a 21 channel routine scalp EEG performed with bipolar and monopolar montages arranged in accordance to the international 10/20 system of electrode placement. One channel was dedicated to EKG recording.  The patient is in the awake state.  Description:  Artifact is prominent during the recording often obscuring the background rhythm. When able to be visualized the background is slow and poorly organized.   It consists of a low voltage, polymorphic delta rhythm with occasional intermixed theta activity.  The maximum posterior background rhythm noted is a 7Hz  theta activity that is poorly sustained.  The patient does not drowse or sleep. No epileptiform activity is noted.  Hyperventilation was not performed.  Intermittent photic stimulation was performed but failed to illicit any change in the tracing.   IMPRESSION: This is an abnormal EEG secondary to general background slowing.  This finding may be seen with a diffuse disturbance that is etiologically nonspecific, but may include a metabolic encephalopathy, among other possibilities.  No epileptiform activity was noted.     Alexis Goodell, MD Neurology [phone] 04/09/2019, 6:17 PM

## 2019-04-09 NOTE — Consult Note (Signed)
Reason for Consult:Starring episode Referring Physician: Leslye Peer  CC: Starring episode  HPI: Kurt [name] is an 78 y.o. male admitted on 12/10 with chest pain.  History of afib on Eliquis.  Today noted by nursing to have a starring episode with eyes deviated to the right.  Now apparently back to baseline.  Patient is amnestic of the event.  Per chart patient has been having some memory problems.  He reports that he lives alone but does not drive.    Past Medical History:  Diagnosis Date  . AAA (abdominal aortic aneurysm) ([place])   . Diabetes mellitus without complication [facility])     Past Surgical History:  Procedure Laterality Date  . ABDOMINAL AORTIC ANEURYSM REPAIR    . arm surgery Left   . EMBOLECTOMY Right 02/17/2018   Procedure: FOGERTY EMBOLECTOMY;  Surgeon: Algernon Huxley, MD;  Location: [facility] ORS;  Service: Vascular;  Laterality: Right;  . FEMORAL-FEMORAL BYPASS GRAFT Right 02/17/2018   Procedure: BYPASS FEMORAL-FEMORAL ARTERY;  Surgeon: Algernon Huxley, MD;  Location: [facility] ORS;  Service: Vascular;  Laterality: Right;  . LEFT HEART CATH AND CORONARY ANGIOGRAPHY N/A 04/08/2019   Procedure: LEFT HEART CATH AND CORONARY ANGIOGRAPHY;  Surgeon: Yolonda Kida, MD;  Location: [facility];  Service: Cardiovascular;  Laterality: N/A;  . THROMBECTOMY FEMORAL ARTERY Right 02/17/2018   Procedure: THROMBECTOMY FEMORAL ARTERY;  Surgeon: Algernon Huxley, MD;  Location: [facility] ORS;  Service: Vascular;  Laterality: Right;    Family History  Problem Relation Age of Onset  . Alzheimer's disease Mother   . Heart disease Brother     Social History:  reports that he has been smoking. He uses smokeless tobacco. He reports previous alcohol use. He reports that he does not use drugs.  Allergies  Allergen Reactions  . Lipitor [Atorvastatin Calcium] Rash    Medications:  I have reviewed the patient's current medications. Prior to Admission:  Medications Prior to Admission  Medication Sig  Dispense Refill Last Dose  . apixaban (ELIQUIS) 5 MG TABS tablet Take 1 tablet (5 mg total) by mouth 2 (two) times daily. 60 tablet 0 48+ hours at Unknown  . Cholecalciferol (D-5000) 5000 units TABS Take 5,000 Units by mouth daily.      . clopidogrel (PLAVIX) 75 MG tablet Take 1 tablet (75 mg total) by mouth daily. 30 tablet 0 48+ hours at Unknown  . co-enzyme Q-10 30 MG capsule Take 30 mg by mouth 3 (three) times daily.     Marland Kitchen docusate sodium (COLACE) 100 MG capsule Take 100 mg by mouth 2 (two) times daily.     Marland Kitchen donepezil (ARICEPT) 5 MG tablet Take 1 tablet (5 mg total) by mouth at bedtime. 30 tablet 0 48+ hours at Unknown  . Garlic Oil 5361 MG CAPS Take 1,000 mg by mouth daily as needed (cholesterol).    Unknown at PRN  . glimepiride (AMARYL) 2 MG tablet Take 2 mg by mouth 2 (two) times daily with a meal.   48+ hours at Unknown  . lisinopril (ZESTRIL) 2.5 MG tablet Take 2.5 mg by mouth daily.   48+ hours at Unknown  . metFORMIN (GLUCOPHAGE) 500 MG tablet Take 1,000 mg by mouth 2 (two) times daily with a meal.   48+ hours at Unknown  . metoprolol succinate (TOPROL-XL) 50 MG 24 hr tablet Take 25 mg by mouth daily. Take with or immediately following a meal.     . Misc Natural Products (PROSTATE THERAPY COMPLEX PO)  Take 562 mg by mouth daily.      . Red Yeast Rice 600 MG CAPS Take 1 capsule by mouth daily.      . vitamin B-12 (CYANOCOBALAMIN) 1000 MCG tablet Take 1,000 mcg by mouth daily.       Scheduled: . amiodarone  200 mg Oral BID  . apixaban  5 mg Oral BID  . aspirin EC  81 mg Oral Daily  . docusate sodium  100 mg Oral BID  . donepezil  5 mg Oral Daily  . furosemide  20 mg Oral Daily  . glimepiride  2 mg Oral Q breakfast  . insulin aspart  0-5 Units Subcutaneous QHS  . insulin aspart  0-9 Units Subcutaneous TID WC  . lisinopril  5 mg Oral Daily  . metoprolol succinate  12.5 mg Oral Daily  . potassium chloride  20 mEq Oral BID  . sodium chloride flush  3 mL Intravenous Q12H  . sodium  chloride flush  3 mL Intravenous Q12H  . spironolactone  12.5 mg Oral Daily    ROS: History obtained from the patient  General ROS: negative for - chills, fatigue, fever, night sweats, weight gain or weight loss Psychological ROS: memory difficulties Ophthalmic ROS: negative for - blurry vision, double vision, eye pain or loss of vision ENT ROS: negative for - epistaxis, nasal discharge, oral lesions, sore throat, tinnitus or vertigo Allergy and Immunology ROS: negative for - hives or itchy/watery eyes Hematological and Lymphatic ROS: negative for - bleeding problems, bruising or swollen lymph nodes Endocrine ROS: negative for - galactorrhea, hair pattern changes, polydipsia/polyuria or temperature intolerance Respiratory ROS: negative for - cough, hemoptysis, shortness of breath or wheezing Cardiovascular ROS: negative for - chest pain, dyspnea on exertion, edema or irregular heartbeat Gastrointestinal ROS: negative for - abdominal pain, diarrhea, hematemesis, nausea/vomiting or stool incontinence Genito-Urinary ROS: negative for - dysuria, hematuria, incontinence or urinary frequency/urgency Musculoskeletal ROS: negative for - joint swelling or muscular weakness Neurological ROS: as noted in HPI Dermatological ROS: negative for rash and skin lesion changes  Physical Examination: Blood pressure 134/80, pulse 63, temperature 98.3 F (36.8 C), temperature source Oral, resp. rate 18, height 6\' 3"  (1.905 m), weight 67 kg, SpO2 97 %.  HEENT-  Normocephalic, no lesions, without obvious abnormality.  Normal external eye and conjunctiva.  Normal TM's bilaterally.  Normal auditory canals and external ears. Normal external nose, mucus membranes and septum.  Normal pharynx. Cardiovascular- S1, S2 normal, pulses palpable throughout   Lungs- chest clear, no wheezing, rales, normal symmetric air entry Abdomen- soft, non-tender; bowel sounds normal; no masses,  no organomegaly Extremities- no  edema Lymph-no adenopathy palpable Musculoskeletal-no joint tenderness, deformity or swelling Skin-warm and dry, no hyperpigmentation, vitiligo, or suspicious lesions  Neurological Examination   Mental Status: Alert., Oriented to name.  Reports that it is January and when asked what the upcoming holiday is he reports February.  Speech fluent without evidence of aphasia.  Confabulatory at times.  Requires extensive reinforcement to follow 3 step commands. Cranial Nerves: II: RHH, pupils equal, round, reactive to light and accommodation III,IV, VI: ptosis not present, extra-ocular motions intact bilaterally V,VII: mild decreased left NLF, facial light touch sensation normal bilaterally VIII: hearing normal bilaterally IX,X: gag reflex present XI: bilateral shoulder shrug XII: midline tongue extension Motor: Right : Upper extremity   5/5    Left:     Upper extremity   5/5  Lower extremity   5/5     Lower extremity  5/5 Tone and bulk:normal tone throughout; no atrophy noted Sensory: Pinprick and light touch intact throughout, bilaterally Deep Tendon Reflexes: Symmetric throughout Plantars: Right: mute   Left: mute Cerebellar: Unable to perform due to ability to follow commands Gait: not tested due to safety concerns   Laboratory Studies:   Basic Metabolic Panel: Recent Labs  Lab 04/05/19 1953 04/05/19 2241 04/06/19 0333 04/07/19 0534 04/08/19 0551 04/09/19 0559  NA 136  --  137 139 139 141  K 4.1  --  3.5 3.6 3.3* 4.1  CL 103  --  106 111 108 108  CO2 24  --  20* 21* 22 23  GLUCOSE 575*  --  513* 159* 101* 149*  BUN 21  --  20 18 21 15   CREATININE 1.37*  --  1.27* 1.06 1.17 0.96  CALCIUM 8.9  --  8.3* 8.5* 8.4* 8.8*  MG  --  1.6*  --   --   --   --     Liver Function Tests: Recent Labs  Lab 04/05/19 1953  AST 87*  ALT 61*  ALKPHOS 124  BILITOT 0.8  PROT 6.7  ALBUMIN 3.7   No results for input(s): LIPASE, AMYLASE in the last 168 hours. No results for input(s):  AMMONIA in the last 168 hours.  CBC: Recent Labs  Lab 04/05/19 1953 04/06/19 0333 04/07/19 0534 04/08/19 0551 04/09/19 0559  WBC 8.6 8.0 8.5 6.3 5.6  NEUTROABS 6.6  --   --   --   --   HGB 14.0 12.8* 11.9* 12.0* 13.0  HCT 40.9 36.7* 35.2* 33.4* 36.9*  MCV 97.4 94.3 95.7 92.0 92.7  PLT 144* 126* 118* 126* 135*    Cardiac Enzymes: No results for input(s): CKTOTAL, CKMB, CKMBINDEX, TROPONINI in the last 168 hours.  BNP: Invalid input(s): POCBNP  CBG: Recent Labs  Lab 04/08/19 1127 04/08/19 1849 04/08/19 2135 04/09/19 0811 04/09/19 1103  GLUCAP 185* 117* 123* 147* 234*    Microbiology: Results for orders placed or performed during the hospital encounter of 04/05/19  SARS CORONAVIRUS 2 (TAT 6-24 HRS) Nasopharyngeal Nasopharyngeal Swab     Status: None   Collection Time: 04/05/19 11:36 PM   Specimen: Nasopharyngeal Swab  Result Value Ref Range Status   SARS Coronavirus 2 NEGATIVE NEGATIVE Final    Comment: (NOTE) SARS-CoV-2 target nucleic acids are NOT DETECTED. The SARS-CoV-2 RNA is generally detectable in upper and lower respiratory specimens during the acute phase of infection. Negative results do not preclude SARS-CoV-2 infection, do not rule out co-infections with other pathogens, and should not be used as the sole basis for treatment or other patient management decisions. Negative results must be combined with clinical observations, patient history, and epidemiological information. The expected result is Negative. Fact Sheet for Patients: HairSlick.nohttps://www.fda.gov/media/138098/download Fact Sheet for Healthcare Providers: quierodirigir.comhttps://www.fda.gov/media/138095/download This test is not yet approved or cleared by the [place] FDA and  has been authorized for detection and/or diagnosis of SARS-CoV-2 by FDA under an Emergency Use Authorization (EUA). This EUA will remain  in effect (meaning this test can be used) for the duration of the COVID-19 declaration under  Section 56 4(b)(1) of the Act, 21 U.S.C. section 360bbb-3(b)(1), unless the authorization is terminated or revoked sooner. Performed at [facility], [address] [place], [place] [address]     Coagulation Studies: No results for input(s): LABPROT, INR in the last 72 hours.  Urinalysis: No results for input(s): COLORURINE, LABSPEC, PHURINE, GLUCOSEU, HGBUR, BILIRUBINUR, KETONESUR, PROTEINUR, UROBILINOGEN,  NITRITE, LEUKOCYTESUR in the last 168 hours.  Invalid input(s): APPERANCEUR  Lipid Panel:     Component Value Date/Time   CHOL 154 04/06/2019 0333   TRIG 109 04/06/2019 0333   HDL 31 (L) 04/06/2019 0333   CHOLHDL 5.0 04/06/2019 0333   VLDL 22 04/06/2019 0333   LDLCALC 101 (H) 04/06/2019 0333    HgbA1C:  Lab Results  Component Value Date   HGBA1C 7.8 (H) 04/06/2019    Urine Drug Screen:      Component Value Date/Time   LABOPIA NONE DETECTED 02/28/2018 1624   COCAINSCRNUR NONE DETECTED 02/28/2018 1624   LABBENZ NONE DETECTED 02/28/2018 1624   AMPHETMU NONE DETECTED 02/28/2018 1624   THCU NONE DETECTED 02/28/2018 1624   LABBARB NONE DETECTED 02/28/2018 1624    Alcohol Level: No results for input(s): ETH in the last 168 hours.  Other results: EKG: sinus rhythm at 52 bpm.  Imaging: CT HEAD WO CONTRAST  Result Date: 04/09/2019 CLINICAL DATA:  Altered mental status EXAM: CT HEAD WITHOUT CONTRAST TECHNIQUE: Contiguous axial images were obtained from the base of the skull through the vertex without intravenous contrast. COMPARISON:  02/28/2018 FINDINGS: Brain: No evidence of acute infarction, hemorrhage, extra-axial collection, ventriculomegaly, or mass effect. 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 Generalized cerebral atrophy. Periventricular white matter low attenuation likely secondary to microangiopathy. Vascular: Cerebrovascular atherosclerotic calcifications are noted. Skull: Negative for fracture or focal lesion. Sinuses/Orbits: Visualized  portions of the orbits are unremarkable. Visualized portions of the paranasal sinuses and mastoid air cells are unremarkable. Other: None. IMPRESSION: No acute intracranial pathology. Electronically Signed   By: Elige Ko   On: 04/09/2019 11:04   CARDIAC CATHETERIZATION  Result Date: 04/08/2019  Ost RCA to Prox RCA lesion is 100% stenosed with 100% stenosed side branch in RV Branch.  Prox Cx lesion is 50% stenosed.  Mid LM lesion is 60% stenosed.  1st Diag lesion is 80% stenosed.  Mid LAD lesion is 60% stenosed.  Ost LAD to Prox LAD lesion is 50% stenosed.  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  Currently felt to be at baseline.  Cognitive deficits and [facility] noted on neurological examination.  Head CT reviewed and shows old right occipital infarct with encephalomalacia and multiple other areas of hypodensity including the left occipital lobe.  No acute changes.  Patient on anticoagulation.  TSH normal.    Recommendations: 1. B12, folate, RPR, B1 2. EEG 3. Agree with continued anticoagulation.   4. Frequent neuro checks   Thana Farr, MD Neurology [phone] 04/09/2019, 3:29 PM

## 2019-04-09 NOTE — Progress Notes (Signed)
Patient ID: Kurt [name] Wainright, male   DOB: Aug 26, 1940, 78 y.o.   MRN: [id number] [facility] PROGRESS NOTE  Kurt [name] [place]long [id number] DOB: Aug 26, 1940 DOA: 04/05/2019 PCP: Jaclyn Shaggyate, Denny C, MD  HPI/Subjective:   Objective: Vitals:   04/09/19 0501 04/09/19 0744  BP: 133/67 (!) 141/88  Pulse: 65 70  Resp: 20 16  Temp: 98.4 F (36.9 C) 98.6 F (37 C)  SpO2: 96% 97%    Filed Weights   04/07/19 0345 04/08/19 0514 04/09/19 0501  Weight: 67.7 kg 64.7 kg 67 kg    ROS: Review of Systems  Constitutional: Negative for chills and fever.  Eyes: Negative for blurred vision.  Respiratory: Negative for cough and shortness of breath.   Cardiovascular: Negative for chest pain.  Gastrointestinal: Negative for abdominal pain, constipation, diarrhea, nausea and vomiting.  Genitourinary: Negative for dysuria.  Musculoskeletal: Negative for joint pain.  Neurological: Negative for dizziness and headaches.   Exam: Physical Exam  HENT:  Nose: No mucosal edema.  Mouth/Throat: No oropharyngeal exudate or posterior oropharyngeal edema.  Eyes: Pupils are equal, round, and reactive to light. Conjunctivae, EOM and lids are normal.  Neck: No JVD present. Carotid bruit is not present. No thyroid mass and no thyromegaly present.  Cardiovascular: S1 normal and S2 normal. Bradycardia present. Exam reveals no gallop.  No murmur heard. Respiratory: No respiratory distress. He has decreased breath sounds in the right lower field and the left lower field. He has no wheezes. He has no rhonchi. He has no rales.  GI: Soft. Bowel sounds are normal. There is no abdominal tenderness.  Musculoskeletal:     Cervical back: No edema.     Right ankle: No swelling.     Left ankle: No swelling.  Lymphadenopathy:    He has no cervical adenopathy.  Neurological: He is alert. No cranial nerve deficit.  Skin: Skin is warm. Nails show no clubbing.  Stage II decubitus ulcer.      Data Reviewed: Basic Metabolic  Panel: Recent Labs  Lab 04/05/19 1953 04/05/19 2241 04/06/19 0333 04/07/19 0534 04/08/19 0551 04/09/19 0559  NA 136  --  137 139 139 141  K 4.1  --  3.5 3.6 3.3* 4.1  CL 103  --  106 111 108 108  CO2 24  --  20* 21* 22 23  GLUCOSE 575*  --  513* 159* 101* 149*  BUN 21  --  20 18 21 15   CREATININE 1.37*  --  1.27* 1.06 1.17 0.96  CALCIUM 8.9  --  8.3* 8.5* 8.4* 8.8*  MG  --  1.6*  --   --   --   --    Liver Function Tests: Recent Labs  Lab 04/05/19 1953  AST 87*  ALT 61*  ALKPHOS 124  BILITOT 0.8  PROT 6.7  ALBUMIN 3.7   CBC: Recent Labs  Lab 04/05/19 1953 04/06/19 0333 04/07/19 0534 04/08/19 0551 04/09/19 0559  WBC 8.6 8.0 8.5 6.3 5.6  NEUTROABS 6.6  --   --   --   --   HGB 14.0 12.8* 11.9* 12.0* 13.0  HCT 40.9 36.7* 35.2* 33.4* 36.9*  MCV 97.4 94.3 95.7 92.0 92.7  PLT 144* 126* 118* 126* 135*   BNP (last 3 results) Recent Labs    04/05/19 1953 04/07/19 0931 04/08/19 0551  BNP 195.0* 1,131.0* 744.0*   CBG: Recent Labs  Lab 04/08/19 1127 04/08/19 1849 04/08/19 2135 04/09/19 0811 04/09/19 1103  GLUCAP 185* 117* 123* 147* 234*  Recent Results (from the past 240 hour(s))  SARS CORONAVIRUS 2 (TAT 6-24 HRS) Nasopharyngeal Nasopharyngeal Swab     Status: None   Collection Time: 04/05/19 11:36 PM   Specimen: Nasopharyngeal Swab  Result Value Ref Range Status   SARS Coronavirus 2 NEGATIVE NEGATIVE Final    Comment: (NOTE) SARS-CoV-2 target nucleic acids are NOT DETECTED. The SARS-CoV-2 RNA is generally detectable in upper and lower respiratory specimens during the acute phase of infection. Negative results do not preclude SARS-CoV-2 infection, do not rule out co-infections with other pathogens, and should not be used as the sole basis for treatment or other patient management decisions. Negative results must be combined with clinical observations, patient history, and epidemiological information. The expected result is Negative. Fact Sheet for  Patients: HairSlick.no Fact Sheet for Healthcare Providers: quierodirigir.com This test is not yet approved or cleared by the [place] FDA and  has been authorized for detection and/or diagnosis of SARS-CoV-2 by FDA under an Emergency Use Authorization (EUA). This EUA will remain  in effect (meaning this test can be used) for the duration of the COVID-19 declaration under Section 56 4(b)(1) of the Act, 21 U.S.C. section 360bbb-3(b)(1), unless the authorization is terminated or revoked sooner. Performed at [facility], [address], [place], [place] [address]       Scheduled Meds: . amiodarone  200 mg Oral BID  . apixaban  5 mg Oral BID  . aspirin EC  81 mg Oral Daily  . docusate sodium  100 mg Oral BID  . donepezil  5 mg Oral Daily  . furosemide  20 mg Intravenous Once  . furosemide  20 mg Oral Daily  . insulin aspart  0-5 Units Subcutaneous QHS  . insulin aspart  0-9 Units Subcutaneous TID WC  . lisinopril  5 mg Oral Daily  . metoprolol succinate  12.5 mg Oral Daily  . potassium chloride  20 mEq Oral BID  . sodium chloride flush  3 mL Intravenous Q12H  . sodium chloride flush  3 mL Intravenous Q12H  . spironolactone  12.5 mg Oral Daily   Continuous Infusions: . sodium chloride      Assessment/Plan:  1. Acute metabolic encephalopathy.  Nurse concerned that he was gazing to the left.  When I saw him he was moving all extremities and able to move his eyes all the way around.  He follow commands and his strength was tested.  Stat CT scan of the head was done and that was negative.  We will get a neurology consultation.  In speaking with the patient's caregiver, she thinks that his short-term memory is not that good and he also admits to not taking his medications as previously prescribed.   2. Wide-complex tachycardia on presentation and now in atrial fibrillation.  Patient on low-dose beta-blocker.  Restart  Eliquis and CT scan of the head negative. 3. 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  Patient has an ejection fraction of 20 to 25%.  Patient on low-dose metoprolol.  Spironolactone and lisinopril added on 04/07/2019.  Oral Lasix ordered.  Cardiology will follow as outpatient and recheck an echocardiogram in 1 month to decide whether he needs an ICD. 4. Rising troponin.  This could be demand ischemia with rapid heart rate.  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  Medical management recommended by cardiology. 5. Type 2 diabetes mellitus with hyperglycemia.  Patient was placed on insulin drip and switched over to glargine insulin and sliding  scale.  Patient's hemoglobin A1c is 7.8.  Will restart low-dose Amaryl.  Hopefully can start Glucophage back tomorrow or the following day. 6. AAA history 7. Stage II decubitus ulcer. 8. Weakness.  Still awaiting physical therapy evaluation.  Code Status:     Code Status Orders  (From admission, onward)         Start     Ordered   04/06/19 0035  Full code  Continuous     04/06/19 0045        Code Status History    Date Active Date Inactive Code Status Order ID Comments User Context   02/28/2018 2049 03/01/2018 1612 Full Code [id number]  Epifanio Lesches, MD ED   02/17/2018 1554 02/23/2018 2009 Full Code [id number]  Fritzi Mandes, MD Inpatient   Advance Care Planning Activity    Advance Directive Documentation     Most Recent Value  Type of Advance Directive  Healthcare Power of Attorney, Living will  Pre-existing out of facility DNR order (yellow form or pink MOST form)  -  "MOST" Form in Place?  -     Family Communication: POA on the phone. Disposition Plan: Would like to get physical therapy evaluation prior to making decision.  Consultants:  Cardiology  Time spent: 28 minutes  Cesar Chavez

## 2019-04-09 NOTE — Progress Notes (Signed)
PT Cancellation Note  Patient Details Name: TANVEER BRAMMER MRN: [id number] DOB: 1940-08-05   Cancelled Treatment:    Reason Eval/Treat Not Completed: Other (comment). Per RN, stat CT imaging, asked to hold PT evaluation at this time. Will re-attempt.   Kamaiyah Uselton 04/09/2019, 10:08 AM  Greggory Stallion, PT, DPT [phone]

## 2019-04-09 NOTE — Care Management Important Message (Signed)
Important Message  Patient Details  Name: Kurt [name] MRN: [id number] Date of Birth: October 24, 1940   Medicare Important Message Given:  Yes     Dannette Barbara 04/09/2019, 12:15 PM

## 2019-04-10 LAB — GLUCOSE, CAPILLARY
Glucose-Capillary: 150 mg/dL — ABNORMAL HIGH (ref 70–99)
Glucose-Capillary: 181 mg/dL — ABNORMAL HIGH (ref 70–99)
Glucose-Capillary: 120 mg/dL — ABNORMAL HIGH (ref 70–99)
Glucose-Capillary: 144 mg/dL — ABNORMAL HIGH (ref 70–99)

## 2019-04-10 LAB — BASIC METABOLIC PANEL
Anion gap: 11 (ref 5–15)
BUN: 12 mg/dL (ref 8–23)
CO2: 25 mmol/L (ref 22–32)
Calcium: 9.2 mg/dL (ref 8.9–10.3)
Chloride: 105 mmol/L (ref 98–111)
Creatinine, Ser: 1.04 mg/dL (ref 0.61–1.24)
GFR calc Af Amer: >60 mL/min (ref >60)
GFR calc non Af Amer: >60 mL/min (ref >60)
Glucose, Bld: 139 mg/dL — ABNORMAL HIGH (ref 70–99)
Potassium: 4.2 mmol/L (ref 3.5–5.1)
Sodium: 141 mmol/L (ref 135–145)

## 2019-04-10 LAB — RPR: RPR Ser Ql: NONREACTIVE

## 2019-04-10 NOTE — Progress Notes (Signed)
PROGRESS NOTE    Kurt [name]  HLK:[id number] DOB: Nov 08, 1940 DOA: 04/05/2019 PCP: Jaclyn Shaggy, MD      Assessment & Plan:   Principal Problem:   Wide-complex tachycardia (HCC) Active Problems:   Tobacco dependence   Abdominal aortic aneurysm without rupture (HCC)   PVD (peripheral vascular disease) (HCC)   Rapid atrial fibrillation (HCC)   RBBB   Hyperglycemia due to type 2 diabetes mellitus (HCC)   Acute on chronic diastolic CHF (congestive heart failure) (HCC)   NSTEMI (non-ST elevated myocardial infarction) (HCC)   Atrial flutter with rapid ventricular response (HCC)   Atrial fibrillation, chronic (HCC)   Decubitus ulcer of sacral region, stage 2 (HCC)   Acute metabolic encephalopathy   Acute metabolic encephalopathy: etiology unclear. Improving. Continue w/ neuro checks. EEG shows generalized slowing but no epileptiform activity. Neuro following and recs apprec. CT brain was neg.   A. Fib: likely PAF. Continue on amiodarone, eliquis. Continue on tele. Wide complex tachycardia on presentation. Cardio following and recs apprec   Arrhythmia: bradycardic intermittently and second degree heart block. Metoprolol d/c as per cardio. Continue on amiodarone. Will consider AICD as an outpatient as per cardio. Continue on tele.   Acute on chronic systolic CHF exacerbation: w/ cardiomyopathy. EF of 20-25%. Continue on lasix, spironolactone, & lisinopril. D/C metoprolol for bradycardia as per cardio. Will consider AICD as an outpatient as per cardio. F/u w/ Dr. Juliann Pares in 1 week after d/c  Elevated troponins: likely secondary to demand ischemia. Medical management recommended by cardiology.  DM2: w/ hyperglycemia. HbA1c 7.8. Continue on glargine & SSI w/ accuchecks.   Hx of AAA: managed by outpatient PCP  Stage II decubitus ulcer: continue w/ wound care  Generalized weakness: PT recommending SNF. CM is aware     DVT prophylaxis: eliquis Code Status: full  Family  Communication: Disposition Plan:    Consultants:   Neuro  cardio   Procedures:   Echo 04/06/19: IMPRESSIONS    1. Left ventricular ejection fraction, by visual estimation, is 20 to 25%. The left ventricle has severely decreased function. Left ventricular septal wall thickness was normal. Normal left ventricular posterior wall thickness. There is no left  ventricular hypertrophy.  2. Definity contrast agent was given IV to delineate the left ventricular endocardial borders.  3. Mildly dilated left ventricular internal cavity size.  4. The left ventricle demonstrates global hypokinesis.  5. Global right ventricle has normal systolic function.The right ventricular size is normal. No increase in right ventricular wall thickness.  6. Left atrial size was normal.  7. Right atrial size was mildly dilated.  8. The mitral valve is grossly normal. Mild to moderate mitral valve regurgitation. No evidence of mitral stenosis.  9. The tricuspid valve is not well visualized. Tricuspid valve regurgitation is trivial. 10. The aortic valve was not well visualized. Aortic valve regurgitation is trivial. 11. The pulmonic valve was not well visualized. Pulmonic valve regurgitation is trivial. 12. Mildly elevated pulmonary artery systolic pressure. 13. The atrial septum is grossly normal.    Antimicrobials: n/a   Subjective: Pt c/o fatigue  Objective: Vitals:   04/09/19 1528 04/09/19 1935 04/09/19 2259 04/10/19 0428  BP: 134/80 (!) 150/65  131/70  Pulse: 63 (!) 59 (!) 52 60  Resp: 18 16  18   Temp: 98.3 F (36.8 C) 97.8 F (36.6 C)  98.4 F (36.9 C)  TempSrc: Oral Oral  Oral  SpO2: 97% 98%  97%  Weight:    63.4 kg  Height:        Intake/Output Summary (Last 24 hours) at 04/10/2019 0746 Last data filed at 04/09/2019 1330 Gross per 24 hour  Intake 100 ml  Output -  Net 100 ml   Filed Weights   04/08/19 0514 04/09/19 0501 04/10/19 0428  Weight: 64.7 kg 67 kg 63.4 kg     Examination:  General exam: Appears calm and comfortable  Respiratory system: Clear to auscultation. Respiratory effort normal. Cardiovascular system: S1 & S2 present. No rubs, gallops or clicks.  Gastrointestinal system: Abdomen is nondistended, soft and nontender.. Normal bowel sounds heard. Central nervous system: Alert and oriented. Moves all 4 extremities Psychiatry: flat mood and affect.     Data Reviewed: I have personally reviewed following labs and imaging studies  CBC: Recent Labs  Lab 04/05/19 1953 04/06/19 0333 04/07/19 0534 04/08/19 0551 04/09/19 0559  WBC 8.6 8.0 8.5 6.3 5.6  NEUTROABS 6.6  --   --   --   --   HGB 14.0 12.8* 11.9* 12.0* 13.0  HCT 40.9 36.7* 35.2* 33.4* 36.9*  MCV 97.4 94.3 95.7 92.0 92.7  PLT 144* 126* 118* 126* 135*   Basic Metabolic Panel: Recent Labs  Lab 04/05/19 2241 04/06/19 0333 04/07/19 0534 04/08/19 0551 04/09/19 0559 04/10/19 0448  NA  --  137 139 139 141 141  K  --  3.5 3.6 3.3* 4.1 4.2  CL  --  106 111 108 108 105  CO2  --  20* 21* 22 23 25   GLUCOSE  --  513* 159* 101* 149* 139*  BUN  --  20 18 21 15 12   CREATININE  --  1.27* 1.06 1.17 0.96 1.04  CALCIUM  --  8.3* 8.5* 8.4* 8.8* 9.2  MG 1.6*  --   --   --   --   --    GFR: Estimated Creatinine Clearance: 54.2 mL/min (by C-G formula based on SCr of 1.04 mg/dL). Liver Function Tests: Recent Labs  Lab 04/05/19 1953  AST 87*  ALT 61*  ALKPHOS 124  BILITOT 0.8  PROT 6.7  ALBUMIN 3.7   No results for input(s): LIPASE, AMYLASE in the last 168 hours. No results for input(s): AMMONIA in the last 168 hours. Coagulation Profile: Recent Labs  Lab 04/06/19 0333  INR 1.2   Cardiac Enzymes: No results for input(s): CKTOTAL, CKMB, CKMBINDEX, TROPONINI in the last 168 hours. BNP (last 3 results) No results for input(s): PROBNP in the last 8760 hours. HbA1C: No results for input(s): HGBA1C in the last 72 hours. CBG: Recent Labs  Lab 04/09/19 0811 04/09/19 1103  04/09/19 1635 04/09/19 1637 04/09/19 2033  GLUCAP 147* 234* 179* 171* 200*   Lipid Profile: No results for input(s): CHOL, HDL, LDLCALC, TRIG, CHOLHDL, LDLDIRECT in the last 72 hours. Thyroid Function Tests: No results for input(s): TSH, T4TOTAL, FREET4, T3FREE, THYROIDAB in the last 72 hours. Anemia Panel: Recent Labs    04/09/19 1610  VITAMINB12 610  FOLATE 22.0   Sepsis Labs: No results for input(s): PROCALCITON, LATICACIDVEN in the last 168 hours.  Recent Results (from the past 240 hour(s))  SARS CORONAVIRUS 2 (TAT 6-24 HRS) Nasopharyngeal Nasopharyngeal Swab     Status: None   Collection Time: 04/05/19 11:36 PM   Specimen: Nasopharyngeal Swab  Result Value Ref Range Status   SARS Coronavirus 2 NEGATIVE NEGATIVE Final    Comment: (NOTE) SARS-CoV-2 target nucleic acids are NOT DETECTED. The SARS-CoV-2 RNA is generally detectable in upper and lower respiratory specimens  during the acute phase of infection. Negative results do not preclude SARS-CoV-2 infection, do not rule out co-infections with other pathogens, and should not be used as the sole basis for treatment or other patient management decisions. Negative results must be combined with clinical observations, patient history, and epidemiological information. The expected result is Negative. Fact Sheet for Patients: HairSlick.no Fact Sheet for Healthcare Providers: quierodirigir.com This test is not yet approved or cleared by the [place] FDA and  has been authorized for detection and/or diagnosis of SARS-CoV-2 by FDA under an Emergency Use Authorization (EUA). This EUA will remain  in effect (meaning this test can be used) for the duration of the COVID-19 declaration under Section 56 4(b)(1) of the Act, 21 U.S.C. section 360bbb-3(b)(1), unless the authorization is terminated or revoked sooner. Performed at [facility], [address],  [place], [place] [address]          Radiology Studies: EEG  Result Date: 04/09/2019 Thana Farr, MD     04/09/2019  6:24 PM ELECTROENCEPHALOGRAM REPORT Patient: Kurt [name]       Room #: 248A-AA EEG No. ID: [id number] Age: 78 y.o.        Sex: male Referring Physician: Renae Gloss Report Date:  04/09/2019       Interpreting Physician: Thana Farr History: SYMON NORWOOD is an 78 y.o. male s/p a starring episode Medications: Pacerone, Eliquis, ASA, Aricept, Lasix, Amaryl, Insulin, Zestril, Metoprolol, Spironolactone Conditions of Recording:  This is a 21 channel routine scalp EEG performed with bipolar and monopolar montages arranged in accordance to the international 10/20 system of electrode placement. One channel was dedicated to EKG recording. The patient is in the awake state. Description:  Artifact is prominent during the recording often obscuring the background rhythm. When able to be visualized the background is slow and poorly organized.   It consists of a low voltage, polymorphic delta rhythm with occasional intermixed theta activity.  The maximum posterior background rhythm noted is a  theta activity that is poorly sustained. The patient does not drowse or sleep. No epileptiform activity is noted. Hyperventilation was not performed.  Intermittent photic stimulation was performed but failed to illicit any change in the tracing. IMPRESSION: This is an abnormal EEG secondary to general background slowing.  This finding may be seen with a diffuse disturbance that is etiologically nonspecific, but may include a metabolic encephalopathy, among other possibilities.  No epileptiform activity was noted.  Thana Farr, MD Neurology [phone] 04/09/2019, 6:17 PM   CT HEAD WO CONTRAST  Result Date: 04/09/2019 CLINICAL DATA:  Altered mental status EXAM: CT HEAD WITHOUT CONTRAST TECHNIQUE: Contiguous axial images were obtained from the base of the skull through the vertex without intravenous  contrast. COMPARISON:  02/28/2018 FINDINGS: Brain: No evidence of acute infarction, hemorrhage, extra-axial collection, ventriculomegaly, or mass effect. 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 Generalized cerebral atrophy. Periventricular white matter low attenuation likely secondary to microangiopathy. Vascular: Cerebrovascular atherosclerotic calcifications are noted. Skull: Negative for fracture or focal lesion. Sinuses/Orbits: Visualized portions of the orbits are unremarkable. Visualized portions of the paranasal sinuses and mastoid air cells are unremarkable. Other: None. IMPRESSION: No acute intracranial pathology. Electronically Signed   By: Elige Ko   On: 04/09/2019 11:04   CARDIAC CATHETERIZATION  Result Date: 04/08/2019  Ost RCA to Prox RCA lesion is 100% stenosed with 100% stenosed side branch in RV Branch.  Prox Cx lesion is 50% stenosed.  Mid LM lesion is 60% stenosed.  1st  Diag lesion is 80% stenosed.  Mid LAD lesion is 60% stenosed.  Ost LAD to Prox LAD lesion is 50% stenosed.  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 amiodarone  200 mg Oral BID  . apixaban  5 mg Oral BID  . aspirin EC  81 mg Oral Daily  . docusate sodium  100 mg Oral BID  . donepezil  5 mg Oral Daily  . feeding supplement (NEPRO CARB STEADY)  237 mL Oral BID BM  . furosemide  20 mg Oral Daily  . glimepiride  2 mg Oral Q breakfast  . insulin aspart  0-5 Units Subcutaneous QHS  . insulin aspart  0-9 Units  Subcutaneous TID WC  . lisinopril  5 mg Oral Daily  . metoprolol succinate  12.5 mg Oral Daily  . multivitamin with minerals  1 tablet Oral Daily  . potassium chloride  20 mEq Oral BID  . sodium chloride flush  3 mL Intravenous Q12H  . sodium chloride flush  3 mL Intravenous Q12H  . spironolactone  12.5 mg Oral Daily   Continuous Infusions: . sodium chloride       LOS: 4 days    Time spent: 30 mins   Wyvonnia Dusky, MD [facility] Pager [phone]  If 7PM-7AM, please contact night-coverage [email or web address] Password [email or web address] 04/10/2019, 7:46 AM

## 2019-04-10 NOTE — Progress Notes (Signed)
Dr. Jimmye Norman and Dr. Ubaldo Glassing aware of patient's low heart rate this morning in the 50's and aware I held the patient's metoprolol for that reason, MD ok with it. No complaints by patient this morning.   Update 0914: CCMD called patient's heart rate dropped to 28, 29 non sustained now back up to 60s still with 2nd degree heart block. Patient asymptomatic, MD's notified.

## 2019-04-10 NOTE — Progress Notes (Addendum)
Patient Name: Kurt [name] Date of Encounter: 04/10/2019  Hospital Problem List     Principal Problem:   Wide-complex tachycardia [facility]) Active Problems:   Tobacco dependence   Abdominal aortic aneurysm without rupture (HCC)   PVD (peripheral vascular disease) (HCC)   Rapid atrial fibrillation (HCC)   RBBB   Hyperglycemia due to type 2 diabetes mellitus (HCC)   Acute on chronic diastolic CHF (congestive heart failure) (HCC)   NSTEMI (non-ST elevated myocardial infarction) (HCC)   Atrial flutter with rapid ventricular response (HCC)   Atrial fibrillation, chronic (HCC)   Decubitus ulcer of sacral region, stage 2 (HCC)   Acute metabolic encephalopathy    Patient Profile       78 year old male with history of probable ischemic cardiomyopathy admitted with wide-complex tachycardia converted to sinus rhythm.  Appeared to be probable ventricular tachycardia.  Patient ruled in for non-ST elevation myocardial infarction.  He has had no further sustained arrhythmia.  Cardiac catheterization revealed ostial to proximal RCA 100% stenosis, 50% proximal left circumflex, 60% mid left main stenosis, 80% first diagonal, 60% mid LAD, ostial LAD 50% stenosis.  Left system had severe calcification.  Recommendation for medical management..   Patient is a poor surgical candidate and anatomy not amenable to PCI.  Has remained somewhat bradycardic but asymptomatic.  Subjective   No complaints today.  Inpatient Medications    . amiodarone  200 mg Oral BID  . apixaban  5 mg Oral BID  . aspirin EC  81 mg Oral Daily  . docusate sodium  100 mg Oral BID  . donepezil  5 mg Oral Daily  . feeding supplement (NEPRO CARB STEADY)  237 mL Oral BID BM  . furosemide  20 mg Oral Daily  . glimepiride  2 mg Oral Q breakfast  . insulin aspart  0-5 Units Subcutaneous QHS  . insulin aspart  0-9 Units Subcutaneous TID WC  . lisinopril  5 mg Oral Daily  . metoprolol succinate  12.5 mg Oral Daily  . multivitamin  with minerals  1 tablet Oral Daily  . potassium chloride  20 mEq Oral BID  . sodium chloride flush  3 mL Intravenous Q12H  . sodium chloride flush  3 mL Intravenous Q12H  . spironolactone  12.5 mg Oral Daily    Vital Signs    Vitals:   04/09/19 1935 04/09/19 2259 04/10/19 0428 04/10/19 0756  BP: (!) 150/65  131/70 137/65  Pulse: (!) 59 (!) 52 60 (!) 57  Resp: 16  18   Temp: 97.8 F (36.6 C)  98.4 F (36.9 C) 97.7 F (36.5 C)  TempSrc: Oral  Oral Oral  SpO2: 98%  97% 95%  Weight:   63.4 kg   Height:        Intake/Output Summary (Last 24 hours) at 04/10/2019 0812 Last data filed at 04/09/2019 1330 Gross per 24 hour  Intake 100 ml  Output --  Net 100 ml   Filed Weights   04/08/19 0514 04/09/19 0501 04/10/19 0428  Weight: 64.7 kg 67 kg 63.4 kg    Physical Exam    GEN: Well nourished, well developed, in no acute distress.  HEENT: normal.  Neck: Supple, no JVD, carotid bruits, or masses. Cardiac: Irregular rhythm Respiratory:  Respirations regular and unlabored, clear to auscultation bilaterally. GI: Soft, nontender, nondistended, BS + x 4. MS: no deformity or atrophy. Skin: warm and dry, no rash. Neuro:  Strength and sensation are intact.  Nonfocal Psych: Somewhat inappropriate  affect.  Labs    CBC Recent Labs    04/08/19 0551 04/09/19 0559  WBC 6.3 5.6  HGB 12.0* 13.0  HCT 33.4* 36.9*  MCV 92.0 92.7  PLT 126* 135*   Basic Metabolic Panel Recent Labs    44/01/02 0559 04/10/19 0448  NA 141 141  K 4.1 4.2  CL 108 105  CO2 23 25  GLUCOSE 149* 139*  BUN 15 12  CREATININE 0.96 1.04  CALCIUM 8.8* 9.2   Liver Function Tests No results for input(s): AST, ALT, ALKPHOS, BILITOT, PROT, ALBUMIN in the last 72 hours. No results for input(s): LIPASE, AMYLASE in the last 72 hours. Cardiac Enzymes No results for input(s): CKTOTAL, CKMB, CKMBINDEX, TROPONINI in the last 72 hours. BNP Recent Labs    04/07/19 0931 04/08/19 0551  BNP 1,131.0* 744.0*    D-Dimer No results for input(s): DDIMER in the last 72 hours. Hemoglobin A1C No results for input(s): HGBA1C in the last 72 hours. Fasting Lipid Panel No results for input(s): CHOL, HDL, LDLCALC, TRIG, CHOLHDL, LDLDIRECT in the last 72 hours. Thyroid Function Tests No results for input(s): TSH, T4TOTAL, T3FREE, THYROIDAB in the last 72 hours.  Invalid input(s): FREET3  Telemetry    Second-degree AV block with intermittent bradycardia  ECG       Radiology    EEG  Result Date: 04/09/2019 Thana Farr, MD     04/09/2019  6:24 PM ELECTROENCEPHALOGRAM REPORT Patient: Kurt [name]       Room #: 248A-AA EEG No. ID: [id number] Age: 78 y.o.        Sex: male Referring Physician: Renae Gloss Report Date:  04/09/2019       Interpreting Physician: Thana Farr History: Kurt [name] is an 78 y.o. male s/p a starring episode Medications: Pacerone, Eliquis, ASA, Aricept, Lasix, Amaryl, Insulin, Zestril, Metoprolol, Spironolactone Conditions of Recording:  This is a 21 channel routine scalp EEG performed with bipolar and monopolar montages arranged in accordance to the international 10/20 system of electrode placement. One channel was dedicated to EKG recording. The patient is in the awake state. Description:  Artifact is prominent during the recording often obscuring the background rhythm. When able to be visualized the background is slow and poorly organized.   It consists of a low voltage, polymorphic delta rhythm with occasional intermixed theta activity.  The maximum posterior background rhythm noted is a  theta activity that is poorly sustained. The patient does not drowse or sleep. No epileptiform activity is noted. Hyperventilation was not performed.  Intermittent photic stimulation was performed but failed to illicit any change in the tracing. IMPRESSION: This is an abnormal EEG secondary to general background slowing.  This finding may be seen with a diffuse disturbance that is  etiologically nonspecific, but may include a metabolic encephalopathy, among other possibilities.  No epileptiform activity was noted.  Thana Farr, MD Neurology [phone] 04/09/2019, 6:17 PM   CT HEAD WO CONTRAST  Result Date: 04/09/2019 CLINICAL DATA:  Altered mental status EXAM: CT HEAD WITHOUT CONTRAST TECHNIQUE: Contiguous axial images were obtained from the base of the skull through the vertex without intravenous contrast. COMPARISON:  02/28/2018 FINDINGS: Brain: No evidence of acute infarction, hemorrhage, extra-axial collection, ventriculomegaly, or mass effect. 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 Generalized cerebral atrophy. Periventricular white matter low attenuation likely secondary to microangiopathy. Vascular: Cerebrovascular atherosclerotic calcifications are noted. Skull: Negative for fracture or focal lesion. Sinuses/Orbits: Visualized portions of the orbits are unremarkable. Visualized portions of the paranasal sinuses and  mastoid air cells are unremarkable. Other: None. IMPRESSION: No acute intracranial pathology. Electronically Signed   By: Kathreen Devoid   On: 04/09/2019 11:04   CARDIAC CATHETERIZATION  Result Date: 04/08/2019  Ost RCA to Prox RCA lesion is 100% stenosed with 100% stenosed side branch in RV Branch.  Prox Cx lesion is 50% stenosed.  Mid LM lesion is 60% stenosed.  1st Diag lesion is 80% stenosed.  Mid LAD lesion is 60% stenosed.  Ost LAD to Prox LAD lesion is 50% stenosed.  Left ventriculogram was not performed unable to cross into the ventricle  Conclusion Multivessel coronary disease including totally occluded proximal to mid right and appears to be a CTO in size vessel Large left main with distal 60% lesion Large LAD with moderate disease 50 to 60% Diagonal 1 with 80% ostial lesion Circumflex was medium in size and only had moderate disease Faint collaterals left to distal right Proximal left system had moderate to severe calcification  Recommend medical therapy   DG Chest Portable 1 View  Result Date: 04/05/2019 CLINICAL DATA:  Atrial fibrillation and chest pain EXAM: PORTABLE CHEST 1 VIEW COMPARISON:  02/25/2018 FINDINGS: Cardiac shadow is enlarged but stable. Aortic calcifications are again seen. Vascular congestion and interstitial edema is noted. No sizable effusion is seen. No focal infiltrate is noted. IMPRESSION: Changes consistent with congestive failure. Electronically Signed   By: Inez Catalina M.D.   On: 04/05/2019 23:42   ECHOCARDIOGRAM COMPLETE  Result Date: 04/06/2019   ECHOCARDIOGRAM REPORT   Patient Name:   Kurt [name] Gillum Date of Exam: 04/06/2019 Medical Rec #:  [id number]      Height:       75.0 in Accession #:    [id number]     Weight:       155.0 lb Date of Birth:  May 01, 1940      BSA:          1.97 m Patient Age:    48 years       BP:           110/59 mmHg Patient Gender: M              HR:           47 bpm. Exam Location:  [facility] Procedure: 2D Echo and Intracardiac Opacification Agent Indications:     NSTEMI  History:         Patient has prior history of Echocardiogram examinations. CHF,                  Arrythmias:Atrial Fibrillation; Risk Factors:Diabetes and                  Current Smoker.  Sonographer:     LTM Referring Phys:  [id number] Athena Masse Diagnosing Phys: Bartholome Bill MD IMPRESSIONS  1. Left ventricular ejection fraction, by visual estimation, is 20 to 25%. The left ventricle has severely decreased function. Left ventricular septal wall thickness was normal. Normal left ventricular posterior wall thickness. There is no left ventricular hypertrophy.  2. Definity contrast agent was given IV to delineate the left ventricular endocardial borders.  3. Mildly dilated left ventricular internal cavity size.  4. The left ventricle demonstrates global hypokinesis.  5. Global right ventricle has normal systolic function.The right ventricular size is normal. No increase in right ventricular wall thickness.  6. Left  atrial size was normal.  7. Right atrial size was mildly dilated.  8. The mitral valve is grossly normal. Mild  to moderate mitral valve regurgitation. No evidence of mitral stenosis.  9. The tricuspid valve is not well visualized. Tricuspid valve regurgitation is trivial. 10. The aortic valve was not well visualized. Aortic valve regurgitation is trivial. 11. The pulmonic valve was not well visualized. Pulmonic valve regurgitation is trivial. 12. Mildly elevated pulmonary artery systolic pressure. 13. The atrial septum is grossly normal. FINDINGS  Left Ventricle: Left ventricular ejection fraction, by visual estimation, is 20 to 25%. The left ventricle has severely decreased function. Definity contrast agent was given IV to delineate the left ventricular endocardial borders. The left ventricle demonstrates global hypokinesis. The left ventricular internal cavity size was mildly dilated left ventricle. Normal left ventricular posterior wall thickness. There is no left ventricular hypertrophy. Right Ventricle: The right ventricular size is normal. No increase in right ventricular wall thickness. Global RV systolic function is has normal systolic function. The tricuspid regurgitant velocity is 2.49 m/s, and with an assumed right atrial pressure  of 10 mmHg, the estimated right ventricular systolic pressure is mildly elevated at 34.8 mmHg. Left Atrium: Left atrial size was normal in size. Right Atrium: Right atrial size was mildly dilated Pericardium: There is no evidence of pericardial effusion. Mitral Valve: The mitral valve is grossly normal. Mild to moderate mitral valve regurgitation. No evidence of mitral valve stenosis by observation. Tricuspid Valve: The tricuspid valve is not well visualized. Tricuspid valve regurgitation is trivial. Aortic Valve: The aortic valve was not well visualized. Aortic valve regurgitation is trivial. Aortic valve mean gradient measures 3.0 mmHg. Aortic valve peak gradient measures 5.0  mmHg. Aortic valve area, by VTI measures 1.37 cm. Pulmonic Valve: The pulmonic valve was not well visualized. Pulmonic valve regurgitation is trivial. Pulmonic regurgitation is trivial. Aorta: The aortic root is normal in size and structure. IAS/Shunts: The atrial septum is grossly normal.  LEFT VENTRICLE PLAX 2D LVIDd:         4.70 cm       Diastology LVIDs:         4.34 cm       LV e' lateral:   4.62 cm/s LV PW:         0.75 cm       LV E/e' lateral: 18.1 LV IVS:        1.27 cm       LV e' medial:    4.13 cm/s LVOT diam:     1.70 cm       LV E/e' medial:  20.2 LV SV:         17 ml LV SV Index:   9.12 LVOT Area:     2.27 cm  LV Volumes (MOD) LV area d, A2C:    32.90 cm LV area d, A4C:    36.90 cm LV area s, A2C:    26.70 cm LV area s, A4C:    26.00 cm LV major d, A2C:   8.08 cm LV major d, A4C:   8.42 cm LV major s, A2C:   7.58 cm LV major s, A4C:   7.21 cm LV vol d, MOD A2C: 110.0 ml LV vol d, MOD A4C: 132.0 ml LV vol s, MOD A2C: 76.0 ml LV vol s, MOD A4C: 75.1 ml LV SV MOD A2C:     34.0 ml LV SV MOD A4C:     132.0 ml LV SV MOD BP:      45.5 ml RIGHT VENTRICLE RV S prime:     8.92 cm/s TAPSE (M-mode): 2.4 cm  LEFT ATRIUM             Index LA diam:        1.70 cm 0.86 cm/m LA Vol (A2C):   58.7 ml 29.81 ml/m LA Vol (A4C):   42.3 ml 21.48 ml/m LA Biplane Vol: 50.8 ml 25.80 ml/m  AORTIC VALVE AV Area (Vmax):    1.29 cm AV Area (Vmean):   1.09 cm AV Area (VTI):     1.37 cm AV Vmax:           112.00 cm/s AV Vmean:          80.500 cm/s AV VTI:            0.236 m AV Peak Grad:      5.0 mmHg AV Mean Grad:      3.0 mmHg LVOT Vmax:         63.50 cm/s LVOT Vmean:        38.800 cm/s LVOT VTI:          0.142 m LVOT/AV VTI ratio: 0.60  AORTA Ao Root diam: 3.60 cm MITRAL VALVE                         TRICUSPID VALVE MV Area (PHT): 2.34 cm              TR Peak grad:   24.8 mmHg MV PHT:        94.00 msec            TR Vmax:        284.00 cm/s MV E velocity: 83.40 cm/s  103 cm/s MV A velocity: 130.00 cm/s 70.3 cm/s  SHUNTS MV E/A ratio:  0.64        1.5       Systemic VTI:  0.14 m                                      Systemic Diam: 1.70 cm  Harold Hedge MD Electronically signed by Harold Hedge MD Signature Date/Time: 04/06/2019/2:25:05 PM    Final     Assessment & Plan     78 year old male with diabetes, tobacco abuse, peripheral vascular disease status post endovascular stent for AAA, history of atrial fibrillation and an known ejection fraction of 25% presented with wide-complex tachycardia. Had some chest heaviness during this episode. He is currently in sinus rhythm with intermittent second-degree heart block.  Ruled in for non-ST elevation myocardial infarction   Wide-complex tachycardia cardia-probable VT, although less likely, could have been aberrant atrial fibrillation. Received amiodarone with conversion to sinus rhythm with second-degree heart block. Somewhat bradycardic however fairly asymptomatic. Currently on metoprolol succinate at 25 mg daily although has some bradycardia but no prolonged pauses. We will discontinue the metoprolol due to continued bradycardia attempt to continue with amiodarone at current dose.  May need to reduce the amiodarone to 200 mg daily if bradycardia becomes worse or he becomes symptomatic.  Will follow rhythm for evidence of breakthrough wide-complex tachycardia. Maintain magnesium greater than 2 and potassium greater than 4. Ejection fraction less than 30. Will need to consider AICD therapy however this will need to be discussed as an outpatient.  Would not place this during inpatient stay. Patient is a somewhat difficult historian.  Cardiac cath revealed diffuse coronary artery disease.  Poor candidate for coronary bypass grafting.  Heavily calcified vessels limiting  PCI approach.  Will attempt medical management.    Cardiomyopathy-etiology unclear ischemic versus idiopathic. Patient not an ideal candidate for surgical intervention.    Cardiomyopathy likely has an  ischemic component but appears out of proportion to his anatomy.  Will attempt medical management with evidence-based heart failure treatment.  After 40 days, consideration for ICD as outpatient.  We will continue with  lisinopril and spironolactone.    Atrial fibrillation-we will continue with Eliquis .  Peripheral vascular disease-status post endovascular stent for AAA. Being followed by vascular surgery as an outpatient.   Neurologic changes-CT unremarkable for acute changes.  EEG showed generalized slowing with no elective form activity.  Appreciate neurology evaluation.   Would ambulate and follow for symptoms.  Will need consideration for discharge if stable.  Follow-up with Dr. Juliann Pares in 1 week after discharge.   Signed, Darlin Priestly Delancey Moraes MD 04/10/2019, 8:12 AM  Pager: [phone]

## 2019-04-10 NOTE — Plan of Care (Signed)
  Problem: Coping: Goal: Level of anxiety will decrease Outcome: Progressing   Problem: Skin Integrity: Goal: Risk for impaired skin integrity will decrease Outcome: Progressing   

## 2019-04-10 NOTE — Progress Notes (Signed)
Physical Therapy Treatment Patient Details Name: Kurt [name] MRN: [id number] DOB: July 31, 1940 Today's Date: 04/10/2019    History of Present Illness 78 yo male with history of probable ischemic cardiomyopathy admitted with wide-complex tachycardia converted to sinus rhythm.  Appeared to be probable ventricular tachycardia.  Patient ruled in for non-ST elevation myocardial infarction.  He has remained hemodynamically stable with no further sustained arrhythmia.  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  Left system had severe calcification.  Recommendation for medical management..   Patient is a poor surgical candidate. DUe to acute neuro deficits, stat CT performed this date, negative at this time. Pending neuro consult    PT Comments    Pt is making good progress towards goals with marked improvement compared to previous date. More alert & oriented today and able to follow commands well. Good endurance with there-ex and progress with static/dynamic balance with ability to ambulate to recliner this date. Still needs significant assist for OOB mobility and is currently not at baseline level. Will continue to progress as able.   Follow Up Recommendations  SNF     Equipment Recommendations  Rolling walker with 5" wheels    Recommendations for Other Services       Precautions / Restrictions Precautions Precautions: Fall;Other (comment) Restrictions Weight Bearing Restrictions: No    Mobility  Bed Mobility Overal bed mobility: Needs Assistance Bed Mobility: Supine to Sit     Supine to sit: Min assist     General bed mobility comments: able to perform with minimal cues for sequencing. Once seated at EOB, needs min assist to scoot towards EOB. During transfer, pt loses balance in post direction needing mod  assist for correction. With cues, pt able to progress sitting at EOB with supervision.  Transfers Overall transfer level: Needs assistance Equipment used: Rolling walker (2 wheeled) Transfers: Sit to/from Stand Sit to Stand: Mod assist         General transfer comment: has difficulty from low bed. Once standing, post leaning noted and bracing B LEs against bed.  With cues, able to shift weight onto toes with improved static standing. Multiple sit<>stands performed  Ambulation/Gait Ambulation/Gait assistance: Mod assist Gait Distance (Feet): 5 Feet Assistive device: Rolling walker (2 wheeled) Gait Pattern/deviations: Step-to pattern     General Gait Details: ambulated to recliner with step to gait pattern with heavy cues for sequencing and use of RW. Needs assist to align infront of chair prior to sitting down. Short shuffle gait pattern   Stairs             Wheelchair Mobility    Modified Rankin (Stroke Patients Only)       Balance Overall balance assessment: Needs assistance Sitting-balance support: No upper extremity supported;Feet supported Sitting balance-Leahy Scale: Poor Sitting balance - Comments: post leaning noted with assist needed to correct   Standing balance support: Bilateral upper extremity supported Standing balance-Leahy Scale: Poor Standing balance comment: post leaning                            Cognition Arousal/Alertness: Awake/alert Behavior During Therapy: Flat affect Overall Cognitive Status: Within Functional Limits for tasks assessed  Exercises Other Exercises Other Exercises: Supine/seated ther-ex performed on B LE including SLRs, heel slides, quad sets, hip abd/add, and resisted shoulder press. All ther-ex performed x 10 reps with supervision Other Exercises: 30 second stand test. Unable to stand without mod assist. Able to achieve 5 reps with mod assist.     General Comments        Pertinent Vitals/Pain Pain Assessment: No/denies pain    Home Living                      Prior Function            PT Goals (current goals can now be found in the care plan section) Acute Rehab PT Goals Patient Stated Goal: go home PT Goal Formulation: With patient Time For Goal Achievement: 04/23/19 Potential to Achieve Goals: Good Progress towards PT goals: Progressing toward goals    Frequency    Min 2X/week      PT Plan Current plan remains appropriate    Co-evaluation              AM-PAC PT "6 Clicks" Mobility   Outcome Measure  Help needed turning from your back to your side while in a flat bed without using bedrails?: A Little Help needed moving from lying on your back to sitting on the side of a flat bed without using bedrails?: A Little Help needed moving to and from a bed to a chair (including a wheelchair)?: A Lot Help needed standing up from a chair using your arms (e.g., wheelchair or bedside chair)?: A Lot Help needed to walk in hospital room?: A Lot Help needed climbing 3-5 steps with a railing? : Total 6 Click Score: 13    End of Session Equipment Utilized During Treatment: Gait belt Activity Tolerance: Patient tolerated treatment well Patient left: in chair;with chair alarm set Nurse Communication: Mobility status PT Visit Diagnosis: Unsteadiness on feet (R26.81);Muscle weakness (generalized) (M62.81);Difficulty in walking, not elsewhere classified (R26.2)     Time: 8527-7824 PT Time Calculation (min) (ACUTE ONLY): 23 min  Charges:  $Gait Training: 8-22 mins $Therapeutic Exercise: 8-22 mins                     Kurt [name], PT, DPT [phone]    Kurt [name] 04/10/2019, 4:39 PM

## 2019-04-10 NOTE — Progress Notes (Addendum)
Dr. Jimmye Norman and Dr. Ubaldo Glassing aware of patient's low heart rate this morning in the 50's and aware I held the patient's metoprolol for that reason, MD ok with it. No complaints by patient this morning.   Update 0914: CCMD called patient's heart rate dropped to 28, 29 non sustained now back up to 60s. Patient asymptomatic, MD's notified.

## 2019-04-11 LAB — GLUCOSE, CAPILLARY
Glucose-Capillary: 181 mg/dL — ABNORMAL HIGH (ref 70–99)
Glucose-Capillary: 277 mg/dL — ABNORMAL HIGH (ref 70–99)
Glucose-Capillary: 107 mg/dL — ABNORMAL HIGH (ref 70–99)
Glucose-Capillary: 138 mg/dL — ABNORMAL HIGH (ref 70–99)

## 2019-04-11 LAB — CBC
HCT: 40.8 % (ref 39.0–52.0)
Hemoglobin: 14.2 g/dL (ref 13.0–17.0)
MCH: 33.1 pg (ref 26.0–34.0)
MCHC: 34.8 g/dL (ref 30.0–36.0)
MCV: 95.1 fL (ref 80.0–100.0)
Platelets: 173 10*3/uL (ref 150–400)
RBC: 4.29 MIL/uL (ref 4.22–5.81)
RDW: 12.8 % (ref 11.5–15.5)
WBC: 7 10*3/uL (ref 4.0–10.5)
nRBC: 0 % (ref 0.0–0.2)

## 2019-04-11 LAB — BASIC METABOLIC PANEL
Anion gap: 10 (ref 5–15)
BUN: 18 mg/dL (ref 8–23)
CO2: 22 mmol/L (ref 22–32)
Calcium: 9.4 mg/dL (ref 8.9–10.3)
Chloride: 104 mmol/L (ref 98–111)
Creatinine, Ser: 1.05 mg/dL (ref 0.61–1.24)
GFR calc Af Amer: >60 mL/min (ref >60)
GFR calc non Af Amer: >60 mL/min (ref >60)
Glucose, Bld: 179 mg/dL — ABNORMAL HIGH (ref 70–99)
Potassium: 4.5 mmol/L (ref 3.5–5.1)
Sodium: 136 mmol/L (ref 135–145)

## 2019-04-11 LAB — ABO/RH: ABO/RH(D): B NEG

## 2019-04-11 LAB — SARS CORONAVIRUS 2 (TAT 6-24 HRS): SARS Coronavirus 2: NEGATIVE

## 2019-04-11 NOTE — TOC Initial Note (Addendum)
Transition of Care [facility]) - Initial/Assessment Note    Patient Details  Name: Kurt [name] MRN: [id number] Date of Birth: Apr 18, 1941  Transition of [facility]) CM/SW Contact:    Darleene Cleaver, LCSW Phone Number: 04/11/2019, 5:44 PM  Clinical Narrative:                 Patient is a 78 year old male who is alert and oriented x4.  CSW spoke to patient to discuss short term rehab options, and PT recommendations.  CSW spoke to patient he stated he has been to rehab in the past, he was not sure if he is interested in going to SNF.  CSW explained how insurance would pay for the stay and what to expect at SNF.  CSW discussed the pros and cons about going home verse SNF from the hospital.  Patient stated he leaves with is significant other he states she is an Charity fundraiser. Patient was more interested in going home with home health.  CSW explained to him that if home health sees him they will not see him every day patient expressed understanding.  Patient stated he will think about it and discuss with his significant other then let CSW know.  CSW informed him that insurance would have to approve SNF placement and he expressed understanding.  Patient is agreeable for CSW to work on SNF placement, however he is not sure if he wants to go to SNF.  CSW received consult for patient needing Heart Failure Screen, patient stated he does not always weigh himself, he does have a scale at home and was aware that he needs to weigh himself daily.  Expected Discharge Plan: Skilled Nursing Facility     Patient Goals and CMS Choice   CMS Medicare.gov Compare Post Acute Care list provided to:: Patient Choice offered to / list presented to : Patient  Expected Discharge Plan and Services Expected Discharge Plan: Skilled Nursing Facility In-house Referral: Clinical Social Work     Living arrangements for the past 2 months: Apartment                                      Prior Living  Arrangements/Services Living arrangements for the past 2 months: Apartment Lives with:: Significant Other Patient language and need for interpreter reviewed:: Yes        Need for Family Participation in Patient Care: No (Comment) Care giver support system in place?: No (comment)   Criminal Activity/Legal Involvement Pertinent to Current Situation/Hospitalization: No - Comment as needed  Activities of Daily Living Home Assistive Devices/Equipment: Cane (specify quad or straight), Walker (specify type)(rollator) ADL Screening (condition at time of admission) Patient's cognitive ability adequate to safely complete daily activities?: Yes Is the patient deaf or have difficulty hearing?: No Does the patient have difficulty seeing, even when wearing glasses/contacts?: No Does the patient have difficulty concentrating, remembering, or making decisions?: Yes Patient able to express need for assistance with ADLs?: Yes Does the patient have difficulty dressing or bathing?: Yes Independently performs ADLs?: No Communication: Independent Dressing (OT): Needs assistance Is this a change from baseline?: Pre-admission baseline Grooming: Needs assistance Is this a change from baseline?: Pre-admission baseline Feeding: Independent Bathing: Needs assistance Is this a change from baseline?: Pre-admission baseline Toileting: Needs assistance Is this a change from baseline?: Pre-admission baseline In/Out Bed: Needs assistance Is this a change from baseline?: Pre-admission baseline Walks in Home:  Needs assistance Is this a change from baseline?: Pre-admission baseline Does the patient have difficulty walking or climbing stairs?: Yes Weakness of Legs: Both Weakness of Arms/Hands: Both  Permission Sought/Granted Permission sought to share information with : Family Supports, Case Freight forwarder, Customer service manager Permission granted to share information with : Yes, Verbal Permission  Granted  Share Information with NAME: Hessie Diener [phone]  Permission granted to share info w AGENCY: snf admissions        Emotional Assessment Appearance:: Appears stated age Attitude/Demeanor/Rapport: Engaged Affect (typically observed): Accepting, Calm, Pleasant, Stable Orientation: : Oriented to Self, Oriented to Place, Oriented to  Time, Oriented to Situation   Psych Involvement: No (comment)  Admission diagnosis:  NSTEMI (non-ST elevated myocardial infarction) ([place]) [I21.4] Rapid atrial fibrillation ([place]) [I48.91] Atrial flutter with rapid ventricular response (HCC) [I48.92] Atrial fibrillation with RVR ([place]) [I48.91] Acute on chronic diastolic CHF (congestive heart failure) (HCC) [I50.33] Type 2 diabetes mellitus with hyperglycemia, without long-term current use of insulin ([place]) [E11.65] Patient Active Problem List   Diagnosis Date Noted  . Acute metabolic encephalopathy   . Atrial fibrillation, chronic ([place])   . Decubitus ulcer of sacral region, stage 2 ([place])   . Rapid atrial fibrillation ([place]) 04/06/2019  . RBBB 04/06/2019  . Hyperglycemia due to type 2 diabetes mellitus ([place]) 04/06/2019  . Acute on chronic diastolic CHF (congestive heart failure) ([place]) 04/06/2019  . NSTEMI (non-ST elevated myocardial infarction) ([place]) 04/06/2019  . Atrial flutter with rapid ventricular response ([place]) 04/06/2019  . Wide-complex tachycardia ([place]) 04/06/2019  . Acute on chronic systolic CHF (congestive heart failure) ([place])   . Elevated troponin   . Leukocytosis 02/28/2018  . Altered mental status 02/28/2018  . Facial droop 02/28/2018  . Pressure injury of skin 02/18/2018  . 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 Carotid stenosis 11/14/2017  . Diabetes mellitus type 2, uncomplicated ([place]) 11/91/4782  . Imbalance 07/06/2016  . 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 Tobacco dependence 05/06/2016  . Diabetes ([place]) 05/06/2016  . Abdominal  aortic aneurysm without rupture ([place]) 05/06/2016  . Atherosclerosis of native arteries of extremity with intermittent claudication ([place]) 05/06/2016  . PVD (peripheral vascular disease) ([place]) 05/20/2014   PCP:  Albina Billet, MD Pharmacy:   [facility] [id number], [place] [address] [place] [address] Phone: [phone] Fax: [phone]  [facility] - [address] [place], [place] [address] Phone: [phone] Fax: [phone]     Social Determinants of Health (SDOH) Interventions    Readmission Risk Interventions No flowsheet data found.

## 2019-04-11 NOTE — NC FL2 (Signed)
[place] LEVEL OF CARE SCREENING TOOL     IDENTIFICATION  Patient Name: Kurt [name] Birthdate: Sep 28, 1940 Sex: male Admission Date (Current Location): 04/05/2019  [place] and [place] Number:  Engineering geologist and Address:  [facility], [address] [place], [place] [address]      Provider Number: [id number]  Attending Physician Name and Address:  Wyvonnia Dusky, MD  Relative Name and Phone Number:  Hessie Diener [phone] or Valentino Hue Sister   [phone]    Current Level of Care: Hospital Recommended Level of Care: [place] Prior Approval Number:    Date Approved/Denied:   PASRR Number: [id number]  Discharge Plan: SNF    Current Diagnoses: Patient Active Problem List   Diagnosis Date Noted  . Acute metabolic encephalopathy   . Atrial fibrillation, chronic ([place])   . Decubitus ulcer of sacral region, stage 2 ([place])   . Rapid atrial fibrillation ([place]) 04/06/2019  . RBBB 04/06/2019  . Hyperglycemia due to type 2 diabetes mellitus ([place]) 04/06/2019  . Acute on chronic diastolic CHF (congestive heart failure) ([place]) 04/06/2019  . NSTEMI (non-ST elevated myocardial infarction) ([place]) 04/06/2019  . Atrial flutter with rapid ventricular response ([place]) 04/06/2019  . Wide-complex tachycardia ([place]) 04/06/2019  . Acute on chronic systolic CHF (congestive heart failure) ([place])   . Elevated troponin   . Leukocytosis 02/28/2018  . Altered mental status 02/28/2018  . Facial droop 02/28/2018  . Pressure injury of skin 02/18/2018  . 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 Carotid stenosis 11/14/2017  . Diabetes mellitus type 2, uncomplicated ([place]) 01/75/1025  . Imbalance 07/06/2016  . 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 Tobacco dependence 05/06/2016  . Diabetes ([place]) 05/06/2016  . Abdominal aortic aneurysm without rupture ([place]) 05/06/2016  . Atherosclerosis of native  arteries of extremity with intermittent claudication ([place]) 05/06/2016  . PVD (peripheral vascular disease) ([place]) 05/20/2014    Orientation RESPIRATION BLADDER Height & Weight     Self, Time, Situation, Place  Normal Continent Weight: 139 lb (63 kg) Height:  6\' 3"  (190.5 cm)  BEHAVIORAL SYMPTOMS/MOOD NEUROLOGICAL BOWEL NUTRITION STATUS      Continent Diet(Cardiac)  AMBULATORY STATUS COMMUNICATION OF NEEDS Skin   Limited Assist Verbally PU Stage and Appropriate Care PU Stage 1 Dressing: (PRN dressing changes) PU Stage 2 Dressing: (PRN dressing changes)                   Personal Care Assistance Level of Assistance  Bathing, Feeding, Dressing Bathing Assistance: Limited assistance Feeding assistance: Independent Dressing Assistance: Limited assistance     Functional Limitations Info  Sight, Speech, Hearing Sight Info: Adequate Hearing Info: Adequate Speech Info: Adequate    SPECIAL CARE FACTORS FREQUENCY  PT (By licensed PT), OT (By licensed OT)     PT Frequency: Minimum 5x a week OT Frequency: Minimum 5x a week            Contractures Contractures Info: Not present    Additional Factors Info  Code Status, Allergies, Insulin Sliding Scale Code Status Info: Full Code Allergies Info: Lipitor Atorvastatin Calcium   Insulin Sliding Scale Info: insulin aspart (novoLOG) injection 0-9 Units 3x a day with meals       Current Medications (04/11/2019):  This is the current hospital active medication list Current Facility-Administered Medications  Medication Dose Route Frequency Provider Last Rate Last Admin  . 0.9 %  sodium chloride infusion  250 mL Intravenous  PRN Callwood, Dwayne D, MD      . acetaminophen (TYLENOL) tablet 650 mg  650 mg Oral Q4H PRN Andris Baumann, MD      . amiodarone (PACERONE) tablet 200 mg  200 mg Oral BID Dalia Heading, MD   200 mg at 04/11/19 0929  . apixaban (ELIQUIS) tablet 5 mg  5 mg Oral BID Alford Highland, MD   5 mg at 04/11/19 0930   . aspirin EC tablet 81 mg  81 mg Oral Daily Andris Baumann, MD   81 mg at 04/11/19 0930  . docusate sodium (COLACE) capsule 100 mg  100 mg Oral BID Alford Highland, MD   100 mg at 04/11/19 0930  . donepezil (ARICEPT) tablet 5 mg  5 mg Oral Daily Alford Highland, MD   5 mg at 04/11/19 0929  . feeding supplement (NEPRO CARB STEADY) liquid 237 mL  237 mL Oral BID BM Wieting, Richard, MD   237 mL at 04/11/19 1249  . furosemide (LASIX) tablet 20 mg  20 mg Oral Daily Alford Highland, MD   20 mg at 04/11/19 0930  . glimepiride (AMARYL) tablet 2 mg  2 mg Oral Q breakfast Alford Highland, MD   2 mg at 04/11/19 0929  . insulin aspart (novoLOG) injection 0-5 Units  0-5 Units Subcutaneous QHS Alford Highland, MD   2 Units at 04/06/19 2117  . insulin aspart (novoLOG) injection 0-9 Units  0-9 Units Subcutaneous TID WC Alford Highland, MD   5 Units at 04/11/19 1249  . lisinopril (ZESTRIL) tablet 5 mg  5 mg Oral Daily Dalia Heading, MD   5 mg at 04/11/19 0930  . multivitamin with minerals tablet 1 tablet  1 tablet Oral Daily Alford Highland, MD   1 tablet at 04/11/19 0930  . ondansetron (ZOFRAN) injection 4 mg  4 mg Intravenous Q6H PRN Lindajo Royal V, MD      . potassium chloride SA (KLOR-CON) CR tablet 20 mEq  20 mEq Oral BID Alford Highland, MD   20 mEq at 04/11/19 0930  . sodium chloride flush (NS) 0.9 % injection 3 mL  3 mL Intravenous Q12H Dalia Heading, MD   3 mL at 04/11/19 0931  . sodium chloride flush (NS) 0.9 % injection 3 mL  3 mL Intravenous Q12H Callwood, Dwayne D, MD   3 mL at 04/10/19 2142  . sodium chloride flush (NS) 0.9 % injection 3 mL  3 mL Intravenous PRN Callwood, Dwayne D, MD      . spironolactone (ALDACTONE) tablet 12.5 mg  12.5 mg Oral Daily Dalia Heading, MD   12.5 mg at 04/11/19 0930     Discharge Medications: Please see discharge summary for a list of discharge medications.  Relevant Imaging Results:  Relevant Lab Results:   Additional Information SSN  [id number]  Darleene Cleaver, LCSW

## 2019-04-11 NOTE — Progress Notes (Signed)
PROGRESS NOTE    Kurt [name]  QMV:[id number] DOB: 07/02/1940 DOA: 04/05/2019 PCP: Jaclyn Shaggy, MD      Assessment & Plan:   Principal Problem:   Wide-complex tachycardia (HCC) Active Problems:   Tobacco dependence   Abdominal aortic aneurysm without rupture (HCC)   PVD (peripheral vascular disease) (HCC)   Rapid atrial fibrillation (HCC)   RBBB   Hyperglycemia due to type 2 diabetes mellitus (HCC)   Acute on chronic diastolic CHF (congestive heart failure) (HCC)   NSTEMI (non-ST elevated myocardial infarction) (HCC)   Atrial flutter with rapid ventricular response (HCC)   Atrial fibrillation, chronic (HCC)   Decubitus ulcer of sacral region, stage 2 (HCC)   Acute metabolic encephalopathy   Acute metabolic encephalopathy: etiology unclear. Improving, likely close to baseline. Continue w/ neuro checks. EEG shows generalized slowing but no epileptiform activity. Neuro following and recs apprec. CT brain was neg.   A. Fib: likely PAF. Continue on amiodarone, eliquis. Continue on tele. Wide complex tachycardia on presentation. Cardio following and recs apprec   Arrhythmia: bradycardic intermittently and wide complex tachycardia, possibly VT. Metoprolol d/c as per cardio. Continue on amiodarone. Will consider AICD as an outpatient as per cardio. Continue on tele.   Acute on chronic systolic CHF exacerbation: w/ cardiomyopathy. EF of 20-25%. Continue on lasix, spironolactone, & lisinopril. D/C metoprolol for bradycardia as per cardio. Will consider AICD as an outpatient as per cardio. F/u w/ Dr. Juliann Pares in 1 week after d/c  Elevated troponins: likely secondary to demand ischemia. Medical management recommended by cardiology.  DM2: w/ hyperglycemia. HbA1c 7.8. Continue on glargine & SSI w/ accuchecks.   Hx of AAA: managed by outpatient PCP  Stage II decubitus ulcer: continue w/ wound care  Generalized weakness: PT recommending SNF. CM is aware     DVT prophylaxis:  eliquis Code Status: full  Family Communication: Disposition Plan: stable for d/c to SNF but waiting on insurance auth and beds    Consultants:   Neuro  cardio   Procedures:   Echo 04/06/19: IMPRESSIONS    1. Left ventricular ejection fraction, by visual estimation, is 20 to 25%. The left ventricle has severely decreased function. Left ventricular septal wall thickness was normal. Normal left ventricular posterior wall thickness. There is no left  ventricular hypertrophy.  2. Definity contrast agent was given IV to delineate the left ventricular endocardial borders.  3. Mildly dilated left ventricular internal cavity size.  4. The left ventricle demonstrates global hypokinesis.  5. Global right ventricle has normal systolic function.The right ventricular size is normal. No increase in right ventricular wall thickness.  6. Left atrial size was normal.  7. Right atrial size was mildly dilated.  8. The mitral valve is grossly normal. Mild to moderate mitral valve regurgitation. No evidence of mitral stenosis.  9. The tricuspid valve is not well visualized. Tricuspid valve regurgitation is trivial. 10. The aortic valve was not well visualized. Aortic valve regurgitation is trivial. 11. The pulmonic valve was not well visualized. Pulmonic valve regurgitation is trivial. 12. Mildly elevated pulmonary artery systolic pressure. 13. The atrial septum is grossly normal.    Antimicrobials: n/a   Subjective: Pt c/o fatigue  Objective: Vitals:   04/10/19 0756 04/10/19 1555 04/10/19 1945 04/11/19 0456  BP: 137/65 126/76 (!) 140/55 138/62  Pulse: (!) 57 62 68 67  Resp:   18 16  Temp: 97.7 F (36.5 C) 98 F (36.7 C) 99.1 F (37.3 C) 98.3 F (36.8 C)  TempSrc: Oral Oral Oral Oral  SpO2: 95% 100% 93% 98%  Weight:    63 kg  Height:        Intake/Output Summary (Last 24 hours) at 04/11/2019 0734 Last data filed at 04/11/2019 0457 Gross per 24 hour  Intake 3 ml  Output 850  ml  Net -847 ml   Filed Weights   04/09/19 0501 04/10/19 0428 04/11/19 0456  Weight: 67 kg 63.4 kg 63 kg    Examination:  General exam: Appears calm and comfortable  Respiratory system: Clear to auscultation. Respiratory effort normal. Cardiovascular system: S1 & S2 present. No rubs, gallops or clicks.  Gastrointestinal system: Abdomen is nondistended, soft and nontender.. Normal bowel sounds heard. Central nervous system: Alert and oriented. Moves all 4 extremities Psychiatry: normal appropriate mood and affect.     Data Reviewed: I have personally reviewed following labs and imaging studies  CBC: Recent Labs  Lab 04/05/19 1953 04/06/19 0333 04/07/19 0534 04/08/19 0551 04/09/19 0559 04/11/19 0443  WBC 8.6 8.0 8.5 6.3 5.6 7.0  NEUTROABS 6.6  --   --   --   --   --   HGB 14.0 12.8* 11.9* 12.0* 13.0 14.2  HCT 40.9 36.7* 35.2* 33.4* 36.9* 40.8  MCV 97.4 94.3 95.7 92.0 92.7 95.1  PLT 144* 126* 118* 126* 135* 173   Basic Metabolic Panel: Recent Labs  Lab 04/05/19 2241 04/07/19 0534 04/08/19 0551 04/09/19 0559 04/10/19 0448 04/11/19 0443  NA  --  139 139 141 141 136  K  --  3.6 3.3* 4.1 4.2 4.5  CL  --  111 108 108 105 104  CO2  --  21* 22 23 25 22   GLUCOSE  --  159* 101* 149* 139* 179*  BUN  --  18 21 15 12 18   CREATININE  --  1.06 1.17 0.96 1.04 1.05  CALCIUM  --  8.5* 8.4* 8.8* 9.2 9.4  MG 1.6*  --   --   --   --   --    GFR: Estimated Creatinine Clearance: 53.4 mL/min (by C-G formula based on SCr of 1.05 mg/dL). Liver Function Tests: Recent Labs  Lab 04/05/19 1953  AST 87*  ALT 61*  ALKPHOS 124  BILITOT 0.8  PROT 6.7  ALBUMIN 3.7   No results for input(s): LIPASE, AMYLASE in the last 168 hours. No results for input(s): AMMONIA in the last 168 hours. Coagulation Profile: Recent Labs  Lab 04/06/19 0333  INR 1.2   Cardiac Enzymes: No results for input(s): CKTOTAL, CKMB, CKMBINDEX, TROPONINI in the last 168 hours. BNP (last 3 results) No  results for input(s): PROBNP in the last 8760 hours. HbA1C: No results for input(s): HGBA1C in the last 72 hours. CBG: Recent Labs  Lab 04/09/19 2033 04/10/19 0756 04/10/19 1201 04/10/19 1643 04/10/19 2113  GLUCAP 200* 150* 181* 120* 144*   Lipid Profile: No results for input(s): CHOL, HDL, LDLCALC, TRIG, CHOLHDL, LDLDIRECT in the last 72 hours. Thyroid Function Tests: No results for input(s): TSH, T4TOTAL, FREET4, T3FREE, THYROIDAB in the last 72 hours. Anemia Panel: Recent Labs    04/09/19 1610  VITAMINB12 610  FOLATE 22.0   Sepsis Labs: No results for input(s): PROCALCITON, LATICACIDVEN in the last 168 hours.  Recent Results (from the past 240 hour(s))  SARS CORONAVIRUS 2 (TAT 6-24 HRS) Nasopharyngeal Nasopharyngeal Swab     Status: None   Collection Time: 04/05/19 11:36 PM   Specimen: Nasopharyngeal Swab  Result Value Ref Range Status  SARS Coronavirus 2 NEGATIVE NEGATIVE Final    Comment: (NOTE) SARS-CoV-2 target nucleic acids are NOT DETECTED. The SARS-CoV-2 RNA is generally detectable in upper and lower respiratory specimens during the acute phase of infection. Negative results do not preclude SARS-CoV-2 infection, do not rule out co-infections with other pathogens, and should not be used as the sole basis for treatment or other patient management decisions. Negative results must be combined with clinical observations, patient history, and epidemiological information. The expected result is Negative. Fact Sheet for Patients: SugarRoll.be Fact Sheet for Healthcare Providers: https://www.woods-mathews.com/ This test is not yet approved or cleared by the [place] FDA and  has been authorized for detection and/or diagnosis of SARS-CoV-2 by FDA under an Emergency Use Authorization (EUA). This EUA will remain  in effect (meaning this test can be used) for the duration of the COVID-19 declaration under Section 56 4(b)(1)  of the Act, 21 U.S.C. section 360bbb-3(b)(1), unless the authorization is terminated or revoked sooner. Performed at [facility], [place] [address] [place], [place] [address]          Radiology Studies: EEG  Result Date: 04/09/2019 Alexis Goodell, MD     04/09/2019  6:24 PM ELECTROENCEPHALOGRAM REPORT Patient: ELISAH PARMER       Room #: 248A-AA EEG No. ID: [id number] Age: 78 y.o.        Sex: male Referring Physician: Leslye Peer Report Date:  04/09/2019       Interpreting Physician: Alexis Goodell History: JAIRO BELLEW is an 78 y.o. male s/p a starring episode Medications: Pacerone, Eliquis, ASA, Aricept, Lasix, Amaryl, Insulin, Zestril, Metoprolol, Spironolactone Conditions of Recording:  This is a 21 channel routine scalp EEG performed with bipolar and monopolar montages arranged in accordance to the international 10/20 system of electrode placement. One channel was dedicated to EKG recording. The patient is in the awake state. Description:  Artifact is prominent during the recording often obscuring the background rhythm. When able to be visualized the background is slow and poorly organized.   It consists of a low voltage, polymorphic delta rhythm with occasional intermixed theta activity.  The maximum posterior background rhythm noted is a 7Hz  theta activity that is poorly sustained. The patient does not drowse or sleep. No epileptiform activity is noted. Hyperventilation was not performed.  Intermittent photic stimulation was performed but failed to illicit any change in the tracing. IMPRESSION: This is an abnormal EEG secondary to general background slowing.  This finding may be seen with a diffuse disturbance that is etiologically nonspecific, but may include a metabolic encephalopathy, among other possibilities.  No epileptiform activity was noted.  Alexis Goodell, MD Neurology [phone] 04/09/2019, 6:17 PM   CT HEAD WO CONTRAST  Result Date: 04/09/2019 CLINICAL DATA:  Altered  mental status EXAM: CT HEAD WITHOUT CONTRAST TECHNIQUE: Contiguous axial images were obtained from the base of the skull through the vertex without intravenous contrast. COMPARISON:  02/28/2018 FINDINGS: Brain: No evidence of acute infarction, hemorrhage, extra-axial collection, ventriculomegaly, or mass effect. 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 Generalized cerebral atrophy. Periventricular white matter low attenuation likely secondary to microangiopathy. Vascular: Cerebrovascular atherosclerotic calcifications are noted. Skull: Negative for fracture or focal lesion. Sinuses/Orbits: Visualized portions of the orbits are unremarkable. Visualized portions of the paranasal sinuses and mastoid air cells are unremarkable. Other: None. IMPRESSION: No acute intracranial pathology. Electronically Signed   By: Kathreen Devoid   On: 04/09/2019 11:04        Scheduled Meds: . amiodarone  200 mg Oral BID  . apixaban  5 mg Oral BID  . aspirin EC  81 mg Oral Daily  . docusate sodium  100 mg Oral BID  . donepezil  5 mg Oral Daily  . feeding supplement (NEPRO CARB STEADY)  237 mL Oral BID BM  . furosemide  20 mg Oral Daily  . glimepiride  2 mg Oral Q breakfast  . insulin aspart  0-5 Units Subcutaneous QHS  . insulin aspart  0-9 Units Subcutaneous TID WC  . lisinopril  5 mg Oral Daily  . multivitamin with minerals  1 tablet Oral Daily  . potassium chloride  20 mEq Oral BID  . sodium chloride flush  3 mL Intravenous Q12H  . sodium chloride flush  3 mL Intravenous Q12H  . spironolactone  12.5 mg Oral Daily   Continuous Infusions: . sodium chloride       LOS: 5 days    Time spent: 30 mins   Charise KillianJamiese M Chaelyn Bunyan, MD [facility] Pager [phone]  If 7PM-7AM, please contact night-coverage [email or web address] Password [facility] 04/11/2019, 7:34 AM

## 2019-04-12 DIAGNOSIS — I4891 Unspecified atrial fibrillation: Secondary | ICD-10-CM

## 2019-04-12 LAB — CBC
HCT: 39.4 % (ref 39.0–52.0)
Hemoglobin: 13.7 g/dL (ref 13.0–17.0)
MCH: 33.3 pg (ref 26.0–34.0)
MCHC: 34.8 g/dL (ref 30.0–36.0)
MCV: 95.9 fL (ref 80.0–100.0)
Platelets: 185 10*3/uL (ref 150–400)
RBC: 4.11 MIL/uL — ABNORMAL LOW (ref 4.22–5.81)
RDW: 13 % (ref 11.5–15.5)
WBC: 7.7 10*3/uL (ref 4.0–10.5)
nRBC: 0 % (ref 0.0–0.2)

## 2019-04-12 LAB — BASIC METABOLIC PANEL
Anion gap: 9 (ref 5–15)
BUN: 23 mg/dL (ref 8–23)
CO2: 24 mmol/L (ref 22–32)
Calcium: 9.3 mg/dL (ref 8.9–10.3)
Chloride: 104 mmol/L (ref 98–111)
Creatinine, Ser: 1.1 mg/dL (ref 0.61–1.24)
GFR calc Af Amer: >60 mL/min (ref >60)
GFR calc non Af Amer: >60 mL/min (ref >60)
Glucose, Bld: 153 mg/dL — ABNORMAL HIGH (ref 70–99)
Potassium: 4.5 mmol/L (ref 3.5–5.1)
Sodium: 137 mmol/L (ref 135–145)

## 2019-04-12 LAB — GLUCOSE, CAPILLARY
Glucose-Capillary: 163 mg/dL — ABNORMAL HIGH (ref 70–99)
Glucose-Capillary: 209 mg/dL — ABNORMAL HIGH (ref 70–99)
Glucose-Capillary: 140 mg/dL — ABNORMAL HIGH (ref 70–99)

## 2019-04-12 LAB — MAGNESIUM: Magnesium: 1.9 mg/dL (ref 1.7–2.4)

## 2019-04-12 MED ORDER — APIXABAN 5 MG PO TABS
5.0000 mg | ORAL_TABLET | Freq: Two times a day (BID) | ORAL | Status: DC
  Administered 2019-04-12: 5 mg via ORAL
  Filled 2019-04-12: qty 1

## 2019-04-12 MED ORDER — ASPIRIN 81 MG PO TBEC: 81.0000 mg | DELAYED_RELEASE_TABLET | Freq: Every day | ORAL | 0 refills | Status: AC

## 2019-04-12 MED ORDER — FUROSEMIDE 20 MG PO TABS: 20.0000 mg | ORAL_TABLET | Freq: Every day | ORAL | 0 refills | Status: AC

## 2019-04-12 MED ORDER — AMIODARONE HCL 200 MG PO TABS: 200.0000 mg | ORAL_TABLET | Freq: Two times a day (BID) | ORAL | 0 refills | Status: AC

## 2019-04-12 MED ORDER — AMIODARONE HCL 200 MG PO TABS: 200.0000 mg | ORAL_TABLET | Freq: Every day | ORAL | Status: DC

## 2019-04-12 MED ORDER — LISINOPRIL 5 MG PO TABS: 5.0000 mg | ORAL_TABLET | Freq: Every day | ORAL | 0 refills | Status: AC

## 2019-04-12 MED ORDER — SPIRONOLACTONE 25 MG PO TABS: 12.5000 mg | ORAL_TABLET | Freq: Every day | ORAL | 0 refills | Status: AC

## 2019-04-12 MED ORDER — AMIODARONE HCL 200 MG PO TABS
200.0000 mg | ORAL_TABLET | Freq: Two times a day (BID) | ORAL | Status: DC
  Filled 2019-04-12: qty 1

## 2019-04-12 NOTE — TOC Progression Note (Signed)
Transition of Care [facility]) - Progression Note    Patient Details  Name: Kurt [name] MRN: [id number] Date of Birth: 1941/02/04  Transition [facility] CM/SW Contact  Ross Ludwig, [place] Phone Number:  04/12/2019, 4:51 PM  Clinical Narrative:    Patient will be transferring to [facility] once a bed is available, CSW signing off.   Expected Discharge Plan: [place]    Expected Discharge Plan and Services Expected Discharge Plan: [place] In-house Referral: Clinical Social Work     Living arrangements for the past 2 months: Apartment Expected Discharge Date: 04/12/19                                     Social Determinants of Health (SDOH) Interventions    Readmission Risk Interventions No flowsheet data found.

## 2019-04-12 NOTE — Progress Notes (Signed)
Received call from [facility] saying they have a bed. Was transferred to their transport team and spoke to [place] whom I gave all the info to. Said he'd call back with an ETA.

## 2019-04-12 NOTE — Plan of Care (Signed)
  Problem: Activity: Goal: Ability to tolerate increased activity will improve Outcome: Not Progressing   Problem: Cardiac: Goal: Ability to achieve and maintain adequate cardiopulmonary perfusion will improve Outcome: Not Progressing

## 2019-04-12 NOTE — Discharge Summary (Signed)
Physician Discharge Summary  RAYNE LOISEAU ZOX:[id number] DOB: May 11, 1940 DOA: 04/05/2019  PCP: Jaclyn Shaggy, MD  Admit date: 04/05/2019 Discharge date: 04/12/2019  Admitted From: home Disposition:  Transfer to [facility]  Recommendations for Outpatient Follow-up:  1. Follow up with PCP in 1-2 weeks 2. F/u w/ cardio as per cardiologist at [facility]: n/a Equipment/Devices: n/a  Discharge Condition: stable CODE STATUS: full  Diet recommendation: Heart Healthy / Carb Modified    Brief/Interim Summary: HPI taken from Dr. Para March: Kurt [name] is a 78 y.o. male with medical history significant for type 2 diabetes, nicotine dependence, peripheral vascular disease with AAA, status post endovascular stent as well as history of atrial fibrillation on Eliquis, followed by cardiologist, Dr. Juliann Pares, who presented to the emergency room with a complaint of sharp nonradiating pain in the left parasternal area associated with nausea.  He denied vomiting, diaphoresis or shortness of breath.  Most of the history taken by his caregiver at the bedside.  EMS reported wide-complex tachycardia.  Arrival in the emergency room he had a soft blood pressure of 102/85 and a heart rate in the 170s.  He was also tachypneic with respirations of 26.  Surgeon saturation was in the 90s. ED Course: In the emergency room he was administered IV diltiazem without significant improvement.  Dropped following diltiazem.ED provider consulted cardiologist Dr. Lady Gary who recommended amiodarone infusion.  Heart rate improved on amiodarone. Lab work-up showed significant abnormalities including a troponin of 1552, BNP of 195.  Sugar 575.  Creatinine 1.37  Hospital course from Dr. Wilfred Lacy 04/10/19-04/12/19: Pt presented w/ acute metabolic encephalopathy of unknown etiology. Neuro was consulted and EEG showed generalized slowing but no epileptiform activity. Pt's caregiver stated that the  pt short term memory was poor but pt has no dx/hx of dementia. Furthermore, pt had intermittently had arrhythmias, including but not limited a. Fib., VT, 2nd AV block. Despite medical management this did not improve, so cardio, Dr. Lady Gary, recommended the pt be transferred to [facility] for evaluation of pacemaker/AICD. The accepting physician is Dr. Arizona Constable.   Discharge Diagnoses:  Principal Problem:   Wide-complex tachycardia (HCC) Active Problems:   Tobacco dependence   Abdominal aortic aneurysm without rupture (HCC)   PVD (peripheral vascular disease) (HCC)   Rapid atrial fibrillation (HCC)   RBBB   Hyperglycemia due to type 2 diabetes mellitus (HCC)   Acute on chronic diastolic CHF (congestive heart failure) (HCC)   NSTEMI (non-ST elevated myocardial infarction) (HCC)   Atrial flutter with rapid ventricular response (HCC)   Atrial fibrillation, chronic (HCC)   Decubitus ulcer of sacral region, stage 2 (HCC)   Acute metabolic encephalopathy    Discharge Instructions  Discharge Instructions    AMB Referral to Cardiac Rehabilitation - Phase II   Complete by: As directed    Diagnosis: NSTEMI   After initial evaluation and assessments completed: Virtual Based Care may be provided alone or in conjunction with Phase 2 Cardiac Rehab based on patient barriers.: Yes   Amb referral to AFIB Clinic   Complete by: As directed    Diet - low sodium heart healthy   Complete by: As directed    Diet Carb Modified   Complete by: As directed    Discharge instructions   Complete by: As directed    Will be transferred to [facility], for evaluation of AICD/pacemaker   Increase activity slowly   Complete by:  As directed      Allergies as of 04/12/2019      Reactions   Lipitor [atorvastatin Calcium] Rash      Medication List    STOP taking these medications   metoprolol succinate 50 MG 24 hr tablet Commonly known as: TOPROL-XL     TAKE  these medications   amiodarone 200 MG tablet Commonly known as: PACERONE Take 1 tablet (200 mg total) by mouth 2 (two) times daily.   apixaban 5 MG Tabs tablet Commonly known as: ELIQUIS Take 1 tablet (5 mg total) by mouth 2 (two) times daily.   aspirin 81 MG EC tablet Take 1 tablet (81 mg total) by mouth daily. Start taking on: April 13, 2019   clopidogrel 75 MG tablet Commonly known as: PLAVIX Take 1 tablet (75 mg total) by mouth daily.   co-enzyme Q-10 30 MG capsule Take 30 mg by mouth 3 (three) times daily.   D-5000 125 MCG (5000 UT) Tabs Generic drug: Cholecalciferol Take 5,000 Units by mouth daily.   docusate sodium 100 MG capsule Commonly known as: COLACE Take 100 mg by mouth 2 (two) times daily.   donepezil 5 MG tablet Commonly known as: ARICEPT Take 1 tablet (5 mg total) by mouth at bedtime.   furosemide 20 MG tablet Commonly known as: LASIX Take 1 tablet (20 mg total) by mouth daily. Start taking on: April 13, 2019   Garlic Oil 1000 MG Caps Take 1,000 mg by mouth daily as needed (cholesterol).   glimepiride 2 MG tablet Commonly known as: AMARYL Take 2 mg by mouth 2 (two) times daily with a meal.   lisinopril 5 MG tablet Commonly known as: ZESTRIL Take 1 tablet (5 mg total) by mouth daily. Start taking on: April 13, 2019 What changed:   medication strength  how much to take   metFORMIN 500 MG tablet Commonly known as: GLUCOPHAGE Take 1,000 mg by mouth 2 (two) times daily with a meal.   PROSTATE THERAPY COMPLEX PO Take 562 mg by mouth daily.   Red Yeast Rice 600 MG Caps Take 1 capsule by mouth daily.   spironolactone 25 MG tablet Commonly known as: ALDACTONE Take 0.5 tablets (12.5 mg total) by mouth daily. Start taking on: April 13, 2019   vitamin B-12 1000 MCG tablet Commonly known as: CYANOCOBALAMIN Take 1,000 mcg by mouth daily.      Follow-up Information    [facility] Follow up.   Specialty:  Cardiac Rehabilitation Why: Your Cardiologist has referred you to outpatient Cardiac Rehab at [facility].  Inpatient short term rehab and physical therapy will need to be completed prior to starting outpatient Cardiac Rehab.  Cardiac Rehab department will follow up on your progress.   Contact information: [address] [id number] ar [place] [address] [phone]       Dorothyann Peng D, MD Follow up in 1 week(s).   Specialties: Cardiology, Internal Medicine Contact information: [address] [place] [address] [phone]          Allergies  Allergen Reactions  . Lipitor [Atorvastatin Calcium] Rash    Consultations:  Cardio  neuro   Procedures/Studies: EEG  Result Date: 04/09/2019 Thana Farr, MD     04/09/2019  6:24 PM ELECTROENCEPHALOGRAM REPORT Patient: LYMAN BALINGIT       Room #: 248A-AA EEG No. ID: [id number] Age: 78 y.o.        Sex: male Referring Physician: Renae Gloss Report Date:  04/09/2019  Interpreting Physician: Alexis Goodell History: RONNEL ZUERCHER is an 78 y.o. male s/p a starring episode Medications: Pacerone, Eliquis, ASA, Aricept, Lasix, Amaryl, Insulin, Zestril, Metoprolol, Spironolactone Conditions of Recording:  This is a 21 channel routine scalp EEG performed with bipolar and monopolar montages arranged in accordance to the international 10/20 system of electrode placement. One channel was dedicated to EKG recording. The patient is in the awake state. Description:  Artifact is prominent during the recording often obscuring the background rhythm. When able to be visualized the background is slow and poorly organized.   It consists of a low voltage, polymorphic delta rhythm with occasional intermixed theta activity.  The maximum posterior background rhythm noted is a 7Hz  theta activity that is poorly sustained. The patient does not drowse or sleep. No epileptiform activity is noted. Hyperventilation was not performed.   Intermittent photic stimulation was performed but failed to illicit any change in the tracing. IMPRESSION: This is an abnormal EEG secondary to general background slowing.  This finding may be seen with a diffuse disturbance that is etiologically nonspecific, but may include a metabolic encephalopathy, among other possibilities.  No epileptiform activity was noted.  Alexis Goodell, MD Neurology [phone] 04/09/2019, 6:17 PM   CT HEAD WO CONTRAST  Result Date: 04/09/2019 CLINICAL DATA:  Altered mental status EXAM: CT HEAD WITHOUT CONTRAST TECHNIQUE: Contiguous axial images were obtained from the base of the skull through the vertex without intravenous contrast. COMPARISON:  02/28/2018 FINDINGS: Brain: No evidence of acute infarction, hemorrhage, extra-axial collection, ventriculomegaly, or mass effect. 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 Generalized cerebral atrophy. Periventricular white matter low attenuation likely secondary to microangiopathy. Vascular: Cerebrovascular atherosclerotic calcifications are noted. Skull: Negative for fracture or focal lesion. Sinuses/Orbits: Visualized portions of the orbits are unremarkable. Visualized portions of the paranasal sinuses and mastoid air cells are unremarkable. Other: None. IMPRESSION: No acute intracranial pathology. Electronically Signed   By: Kathreen Devoid   On: 04/09/2019 11:04   CARDIAC CATHETERIZATION  Result Date: 04/08/2019  Ost RCA to Prox RCA lesion is 100% stenosed with 100% stenosed side branch in RV Branch.  Prox Cx lesion is 50% stenosed.  Mid LM lesion is 60% stenosed.  1st Diag lesion is 80% stenosed.  Mid LAD lesion is 60% stenosed.  Ost LAD to Prox LAD lesion is 50% stenosed.  Left ventriculogram was not performed unable to cross into the ventricle  Conclusion Multivessel coronary disease including totally occluded proximal to mid right and appears to be a CTO in size vessel Large left main with distal 60% lesion  Large LAD with moderate disease 50 to 60% Diagonal 1 with 80% ostial lesion Circumflex was medium in size and only had moderate disease Faint collaterals left to distal right Proximal left system had moderate to severe calcification Recommend medical therapy   DG Chest Portable 1 View  Result Date: 04/05/2019 CLINICAL DATA:  Atrial fibrillation and chest pain EXAM: PORTABLE CHEST 1 VIEW COMPARISON:  02/25/2018 FINDINGS: Cardiac shadow is enlarged but stable. Aortic calcifications are again seen. Vascular congestion and interstitial edema is noted. No sizable effusion is seen. No focal infiltrate is noted. IMPRESSION: Changes consistent with congestive failure. Electronically Signed   By: Inez Catalina M.D.   On: 04/05/2019 23:42   ECHOCARDIOGRAM COMPLETE  Result Date: 04/06/2019   ECHOCARDIOGRAM REPORT   Patient Name:   LADARIOUS KRESSE Wetherington Date of Exam: 04/06/2019 Medical Rec #:  [id number]      Height:  75.0 in Accession #:    [id number]     Weight:       155.0 lb Date of Birth:  July 20, 1940      BSA:          1.97 m Patient Age:    78 years       BP:           110/59 mmHg Patient Gender: M              HR:           47 bpm. Exam Location:  [facility] Procedure: 2D Echo and Intracardiac Opacification Agent Indications:     NSTEMI  History:         Patient has prior history of Echocardiogram examinations. CHF,                  Arrythmias:Atrial Fibrillation; Risk Factors:Diabetes and                  Current Smoker.  Sonographer:     LTM Referring Phys:  [id number] Andris Baumann Diagnosing Phys: Harold Hedge MD IMPRESSIONS  1. Left ventricular ejection fraction, by visual estimation, is 20 to 25%. The left ventricle has severely decreased function. Left ventricular septal wall thickness was normal. Normal left ventricular posterior wall thickness. There is no left ventricular hypertrophy.  2. Definity contrast agent was given IV to delineate the left ventricular endocardial borders.  3. Mildly dilated left  ventricular internal cavity size.  4. The left ventricle demonstrates global hypokinesis.  5. Global right ventricle has normal systolic function.The right ventricular size is normal. No increase in right ventricular wall thickness.  6. Left atrial size was normal.  7. Right atrial size was mildly dilated.  8. The mitral valve is grossly normal. Mild to moderate mitral valve regurgitation. No evidence of mitral stenosis.  9. The tricuspid valve is not well visualized. Tricuspid valve regurgitation is trivial. 10. The aortic valve was not well visualized. Aortic valve regurgitation is trivial. 11. The pulmonic valve was not well visualized. Pulmonic valve regurgitation is trivial. 12. Mildly elevated pulmonary artery systolic pressure. 13. The atrial septum is grossly normal. FINDINGS  Left Ventricle: Left ventricular ejection fraction, by visual estimation, is 20 to 25%. The left ventricle has severely decreased function. Definity contrast agent was given IV to delineate the left ventricular endocardial borders. The left ventricle demonstrates global hypokinesis. The left ventricular internal cavity size was mildly dilated left ventricle. Normal left ventricular posterior wall thickness. There is no left ventricular hypertrophy. Right Ventricle: The right ventricular size is normal. No increase in right ventricular wall thickness. Global RV systolic function is has normal systolic function. The tricuspid regurgitant velocity is 2.49 m/s, and with an assumed right atrial pressure  of 10 mmHg, the estimated right ventricular systolic pressure is mildly elevated at 34.8 mmHg. Left Atrium: Left atrial size was normal in size. Right Atrium: Right atrial size was mildly dilated Pericardium: There is no evidence of pericardial effusion. Mitral Valve: The mitral valve is grossly normal. Mild to moderate mitral valve regurgitation. No evidence of mitral valve stenosis by observation. Tricuspid Valve: The tricuspid valve is  not well visualized. Tricuspid valve regurgitation is trivial. Aortic Valve: The aortic valve was not well visualized. Aortic valve regurgitation is trivial. Aortic valve mean gradient measures 3.0 mmHg. Aortic valve peak gradient measures 5.0 mmHg. Aortic valve area, by VTI measures 1.37 cm. Pulmonic Valve: The pulmonic valve was not well visualized.  Pulmonic valve regurgitation is trivial. Pulmonic regurgitation is trivial. Aorta: The aortic root is normal in size and structure. IAS/Shunts: The atrial septum is grossly normal.  LEFT VENTRICLE PLAX 2D LVIDd:         4.70 cm       Diastology LVIDs:         4.34 cm       LV e' lateral:   4.62 cm/s LV PW:         0.75 cm       LV E/e' lateral: 18.1 LV IVS:        1.27 cm       LV e' medial:    4.13 cm/s LVOT diam:     1.70 cm       LV E/e' medial:  20.2 LV SV:         17 ml LV SV Index:   9.12 LVOT Area:     2.27 cm  LV Volumes (MOD) LV area d, A2C:    32.90 cm LV area d, A4C:    36.90 cm LV area s, A2C:    26.70 cm LV area s, A4C:    26.00 cm LV major d, A2C:   8.08 cm LV major d, A4C:   8.42 cm LV major s, A2C:   7.58 cm LV major s, A4C:   7.21 cm LV vol d, MOD A2C: 110.0 ml LV vol d, MOD A4C: 132.0 ml LV vol s, MOD A2C: 76.0 ml LV vol s, MOD A4C: 75.1 ml LV SV MOD A2C:     34.0 ml LV SV MOD A4C:     132.0 ml LV SV MOD BP:      45.5 ml RIGHT VENTRICLE RV S prime:     8.92 cm/s TAPSE (M-mode): 2.4 cm LEFT ATRIUM             Index LA diam:        1.70 cm 0.86 cm/m LA Vol (A2C):   58.7 ml 29.81 ml/m LA Vol (A4C):   42.3 ml 21.48 ml/m LA Biplane Vol: 50.8 ml 25.80 ml/m  AORTIC VALVE AV Area (Vmax):    1.29 cm AV Area (Vmean):   1.09 cm AV Area (VTI):     1.37 cm AV Vmax:           112.00 cm/s AV Vmean:          80.500 cm/s AV VTI:            0.236 m AV Peak Grad:      5.0 mmHg AV Mean Grad:      3.0 mmHg LVOT Vmax:         63.50 cm/s LVOT Vmean:        38.800 cm/s LVOT VTI:          0.142 m LVOT/AV VTI ratio: 0.60  AORTA Ao Root diam: 3.60 cm MITRAL VALVE                          TRICUSPID VALVE MV Area (PHT): 2.34 cm              TR Peak grad:   24.8 mmHg MV PHT:        94.00 msec            TR Vmax:        284.00 cm/s MV E velocity: 83.40 cm/s  103 cm/s MV A velocity: 130.00 cm/s  70.3 cm/s SHUNTS MV E/A ratio:  0.64        1.5       Systemic VTI:  0.14 m                                      Systemic Diam: 1.70 cm  Harold Hedge MD Electronically signed by Harold Hedge MD Signature Date/Time: 04/06/2019/2:25:05 PM    Final     (Echo, Carotid, EGD, Colonoscopy, ERCP)    Subjective:   Discharge Exam: Vitals:   04/12/19 1812 04/12/19 1813  BP: 128/80 128/80  Pulse: 60 66  Resp:  18  Temp: 98 F (36.7 C) 98 F (36.7 C)  SpO2: 99% 98%   Vitals:   04/12/19 0816 04/12/19 1536 04/12/19 1812 04/12/19 1813  BP: 128/76 126/71 128/80 128/80  Pulse: 60 (!) 56 60 66  Resp:  17  18  Temp: 97.8 F (36.6 C) 98 F (36.7 C) 98 F (36.7 C) 98 F (36.7 C)  TempSrc: Oral Oral  Oral  SpO2: 96% 98% 99% 98%  Weight:      Height:        General: Pt is alert, awake, not in acute distress Cardiovascular: S1/S2 +, no rubs, no gallops Respiratory: CTA bilaterally, no wheezing, no rhonchi Abdominal: Soft, NT, ND, bowel sounds + Extremities: no edema, no cyanosis    The results of significant diagnostics from this hospitalization (including imaging, microbiology, ancillary and laboratory) are listed below for reference.     Microbiology: Recent Results (from the past 240 hour(s))  SARS CORONAVIRUS 2 (TAT 6-24 HRS) Nasopharyngeal Nasopharyngeal Swab     Status: None   Collection Time: 04/05/19 11:36 PM   Specimen: Nasopharyngeal Swab  Result Value Ref Range Status   SARS Coronavirus 2 NEGATIVE NEGATIVE Final    Comment: (NOTE) SARS-CoV-2 target nucleic acids are NOT DETECTED. The SARS-CoV-2 RNA is generally detectable in upper and lower respiratory specimens during the acute phase of infection. Negative results do not preclude SARS-CoV-2  infection, do not rule out co-infections with other pathogens, and should not be used as the sole basis for treatment or other patient management decisions. Negative results must be combined with clinical observations, patient history, and epidemiological information. The expected result is Negative. Fact Sheet for Patients: HairSlick.no Fact Sheet for Healthcare Providers: quierodirigir.com This test is not yet approved or cleared by the [place] FDA and  has been authorized for detection and/or diagnosis of SARS-CoV-2 by FDA under an Emergency Use Authorization (EUA). This EUA will remain  in effect (meaning this test can be used) for the duration of the COVID-19 declaration under Section 56 4(b)(1) of the Act, 21 U.S.C. section 360bbb-3(b)(1), unless the authorization is terminated or revoked sooner. Performed at [facility], [address] [place], [place] [address]   SARS CORONAVIRUS 2 (TAT 6-24 HRS) Nasopharyngeal Nasopharyngeal Swab     Status: None   Collection Time: 04/11/19  2:45 PM   Specimen: Nasopharyngeal Swab  Result Value Ref Range Status   SARS Coronavirus 2 NEGATIVE NEGATIVE Final    Comment: (NOTE) SARS-CoV-2 target nucleic acids are NOT DETECTED. The SARS-CoV-2 RNA is generally detectable in upper and lower respiratory specimens during the acute phase of infection. Negative results do not preclude SARS-CoV-2 infection, do not rule out co-infections with other pathogens, and should not be used as the sole basis for treatment or  other patient management decisions. Negative results must be combined with clinical observations, patient history, and epidemiological information. The expected result is Negative. Fact Sheet for Patients: HairSlick.no Fact Sheet for Healthcare Providers: quierodirigir.com This test is not yet approved or cleared by  the [place] FDA and  has been authorized for detection and/or diagnosis of SARS-CoV-2 by FDA under an Emergency Use Authorization (EUA). This EUA will remain  in effect (meaning this test can be used) for the duration of the COVID-19 declaration under Section 56 4(b)(1) of the Act, 21 U.S.C. section 360bbb-3(b)(1), unless the authorization is terminated or revoked sooner. Performed at [facility], [address] [place], [place] [address]      Labs: BNP (last 3 results) Recent Labs    04/05/19 1953 04/07/19 0931 04/08/19 0551  BNP 195.0* 1,131.0* 744.0*   Basic Metabolic Panel: Recent Labs  Lab 04/05/19 2241 04/08/19 0551 04/09/19 0559 04/10/19 0448 04/11/19 0443 04/12/19 0429  NA  --  139 141 141 136 137  K  --  3.3* 4.1 4.2 4.5 4.5  CL  --  108 108 105 104 104  CO2  --  GLUCOSE  --  101* 149* 139* 179* 153*  BUN  --  CREATININE  --  1.17 0.96 1.04 1.05 1.10  CALCIUM  --  8.4* 8.8* 9.2 9.4 9.3  MG 1.6*  --   --   --   --  1.9   Liver Function Tests: Recent Labs  Lab 04/05/19 1953  AST 87*  ALT 61*  ALKPHOS 124  BILITOT 0.8  PROT 6.7  ALBUMIN 3.7   No results for input(s): LIPASE, AMYLASE in the last 168 hours. No results for input(s): AMMONIA in the last 168 hours. CBC: Recent Labs  Lab 04/05/19 1953 04/07/19 0534 04/08/19 0551 04/09/19 0559 04/11/19 0443 04/12/19 0429  WBC 8.6 8.5 6.3 5.6 7.0 7.7  NEUTROABS 6.6  --   --   --   --   --   HGB 14.0 11.9* 12.0* 13.0 14.2 13.7  HCT 40.9 35.2* 33.4* 36.9* 40.8 39.4  MCV 97.4 95.7 92.0 92.7 95.1 95.9  PLT 144* 118* 126* 135* 173 185   Cardiac Enzymes: No results for input(s): CKTOTAL, CKMB, CKMBINDEX, TROPONINI in the last 168 hours. BNP: Invalid input(s): POCBNP CBG: Recent Labs  Lab 04/11/19 1739 04/11/19 2115 04/12/19 0817 04/12/19 1146 04/12/19 1642  GLUCAP 107* 138* 163* 209* 140*   D-Dimer No results for input(s): DDIMER in the last 72  hours. Hgb A1c No results for input(s): HGBA1C in the last 72 hours. Lipid Profile No results for input(s): CHOL, HDL, LDLCALC, TRIG, CHOLHDL, LDLDIRECT in the last 72 hours. Thyroid function studies No results for input(s): TSH, T4TOTAL, T3FREE, THYROIDAB in the last 72 hours.  Invalid input(s): FREET3 Anemia work up No results for input(s): VITAMINB12, FOLATE, FERRITIN, TIBC, IRON, RETICCTPCT in the last 72 hours. Urinalysis    Component Value Date/Time   COLORURINE YELLOW (A) 02/28/2018 1624   APPEARANCEUR CLEAR (A) 02/28/2018 1624   LABSPEC 1.015 02/28/2018 1624   PHURINE 6.0 02/28/2018 1624   GLUCOSEU NEGATIVE 02/28/2018 1624   HGBUR NEGATIVE 02/28/2018 1624   BILIRUBINUR NEGATIVE 02/28/2018 1624   KETONESUR NEGATIVE 02/28/2018 1624   PROTEINUR NEGATIVE 02/28/2018 1624   NITRITE NEGATIVE 02/28/2018 1624   LEUKOCYTESUR NEGATIVE 02/28/2018 1624   Sepsis Labs Invalid input(s): PROCALCITONIN,  WBC,  LACTICIDVEN Microbiology Recent  Results (from the past 240 hour(s))  SARS CORONAVIRUS 2 (TAT 6-24 HRS) Nasopharyngeal Nasopharyngeal Swab     Status: None   Collection Time: 04/05/19 11:36 PM   Specimen: Nasopharyngeal Swab  Result Value Ref Range Status   SARS Coronavirus 2 NEGATIVE NEGATIVE Final    Comment: (NOTE) SARS-CoV-2 target nucleic acids are NOT DETECTED. The SARS-CoV-2 RNA is generally detectable in upper and lower respiratory specimens during the acute phase of infection. Negative results do not preclude SARS-CoV-2 infection, do not rule out co-infections with other pathogens, and should not be used as the sole basis for treatment or other patient management decisions. Negative results must be combined with clinical observations, patient history, and epidemiological information. The expected result is Negative. Fact Sheet for Patients: HairSlick.no Fact Sheet for Healthcare Providers: quierodirigir.com This  test is not yet approved or cleared by the [place] FDA and  has been authorized for detection and/or diagnosis of SARS-CoV-2 by FDA under an Emergency Use Authorization (EUA). This EUA will remain  in effect (meaning this test can be used) for the duration of the COVID-19 declaration under Section 56 4(b)(1) of the Act, 21 U.S.C. section 360bbb-3(b)(1), unless the authorization is terminated or revoked sooner. Performed at [facility], [address] [place], [place] [address]   SARS CORONAVIRUS 2 (TAT 6-24 HRS) Nasopharyngeal Nasopharyngeal Swab     Status: None   Collection Time: 04/11/19  2:45 PM   Specimen: Nasopharyngeal Swab  Result Value Ref Range Status   SARS Coronavirus 2 NEGATIVE NEGATIVE Final    Comment: (NOTE) SARS-CoV-2 target nucleic acids are NOT DETECTED. The SARS-CoV-2 RNA is generally detectable in upper and lower respiratory specimens during the acute phase of infection. Negative results do not preclude SARS-CoV-2 infection, do not rule out co-infections with other pathogens, and should not be used as the sole basis for treatment or other patient management decisions. Negative results must be combined with clinical observations, patient history, and epidemiological information. The expected result is Negative. Fact Sheet for Patients: HairSlick.no Fact Sheet for Healthcare Providers: quierodirigir.com This test is not yet approved or cleared by the [place] FDA and  has been authorized for detection and/or diagnosis of SARS-CoV-2 by FDA under an Emergency Use Authorization (EUA). This EUA will remain  in effect (meaning this test can be used) for the duration of the COVID-19 declaration under Section 56 4(b)(1) of the Act, 21 U.S.C. section 360bbb-3(b)(1), unless the authorization is terminated or revoked sooner. Performed at [facility], [address], [place],  [place] [address]      Time coordinating discharge: Over 30 minutes  SIGNED:   Charise Killian, MD  [facility] 04/12/2019, 6:28 PM Pager   If 7PM-7AM, please contact night-coverage [email or web address] Password [email or web address]

## 2019-04-12 NOTE — Progress Notes (Signed)
Patient Name: Kurt [name] Date of Encounter: 04/12/2019  Hospital Problem List     Principal Problem:   Wide-complex tachycardia [facility]) Active Problems:   Tobacco dependence   Abdominal aortic aneurysm without rupture (HCC)   PVD (peripheral vascular disease) ([place])   Rapid atrial fibrillation (HCC)   RBBB   Hyperglycemia due to type 2 diabetes mellitus (HCC)   Acute on chronic diastolic CHF (congestive heart failure) (HCC)   NSTEMI (non-ST elevated myocardial infarction) (HCC)   Atrial flutter with rapid ventricular response (HCC)   Atrial fibrillation, chronic (HCC)   Decubitus ulcer of sacral region, stage 2 ([place])   Acute metabolic encephalopathy    Patient Profile      78 year old male with history of probable ischemic cardiomyopathy admitted with wide-complex tachycardia converted to sinus rhythm. Appeared to be probable ventricular tachycardia. Patient ruled in for non-ST elevation myocardial infarction. He has had no further sustained arrhythmia. Cardiac catheterization revealed ostial to proximal RCA 100% stenosis, 50% proximal left circumflex, 60% mid left main stenosis, 80% first diagonal, 60% mid LAD, ostial LAD 50% stenosis. Left system had severe calcification. Recommendation for medical management.. Patient is a poor surgical candidate and anatomy not amenable to PCI.    Has had intermittent high-grade heart block and wide-complex tachycardia.   Subjective   Asymptomatic.  Somewhat difficult historian  Inpatient Medications    . amiodarone  200 mg Oral Daily  . apixaban  5 mg Oral BID  . aspirin EC  81 mg Oral Daily  . docusate sodium  100 mg Oral BID  . donepezil  5 mg Oral Daily  . feeding supplement (NEPRO CARB STEADY)  237 mL Oral BID BM  . furosemide  20 mg Oral Daily  . glimepiride  2 mg Oral Q breakfast  . insulin aspart  0-5 Units Subcutaneous QHS  . insulin aspart  0-9 Units Subcutaneous TID WC  . lisinopril  5 mg Oral Daily  .  multivitamin with minerals  1 tablet Oral Daily  . potassium chloride  20 mEq Oral BID  . sodium chloride flush  3 mL Intravenous Q12H  . sodium chloride flush  3 mL Intravenous Q12H  . spironolactone  12.5 mg Oral Daily    Vital Signs    Vitals:   04/11/19 0853 04/11/19 1737 04/11/19 1931 04/12/19 0241  BP: 124/67 138/74 136/82 (!) 151/58  Pulse: 71 72 70 (!) 55  Resp:   20 16  Temp:  97.7 F (36.5 C) 98.5 F (36.9 C) 98.6 F (37 C)  TempSrc:  Oral Oral Oral  SpO2: 100%  100% 97%  Weight:    62.6 kg  Height:        Intake/Output Summary (Last 24 hours) at 04/12/2019 0748 Last data filed at 04/11/2019 2302 Gross per 24 hour  Intake 123 ml  Output 600 ml  Net -477 ml   Filed Weights   04/10/19 0428 04/11/19 0456 04/12/19 0241  Weight: 63.4 kg 63 kg 62.6 kg    Physical Exam    GEN: Well nourished, well developed, in no acute distress.  HEENT: normal.  Neck: Supple, no JVD, carotid bruits, or masses. Cardiac: Irregular rhythm somewhat bradycardic y.  Respiratory:  Respirations regular and unlabored, clear to auscultation bilaterally. GI: Soft, nontender, nondistended, BS + x 4. MS: no deformity or atrophy. Skin: warm and dry, no rash. Neuro:  Strength and sensation are intact. Psych: Somewhat flat affect  Labs    CBC Recent Labs  04/11/19 0443 04/12/19 0429  WBC 7.0 7.7  HGB 14.2 13.7  HCT 40.8 39.4  MCV 95.1 95.9  PLT 173 185   Basic Metabolic Panel Recent Labs    40/98/11 0443 04/12/19 0429  NA 136 137  K 4.5 4.5  CL 104 104  CO2 22 24  GLUCOSE 179* 153*  BUN 18 23  CREATININE 1.05 1.10  CALCIUM 9.4 9.3  MG  --  1.9   Liver Function Tests No results for input(s): AST, ALT, ALKPHOS, BILITOT, PROT, ALBUMIN in the last 72 hours. No results for input(s): LIPASE, AMYLASE in the last 72 hours. Cardiac Enzymes No results for input(s): CKTOTAL, CKMB, CKMBINDEX, TROPONINI in the last 72 hours. BNP No results for input(s): BNP in the last 72  hours. D-Dimer No results for input(s): DDIMER in the last 72 hours. Hemoglobin A1C No results for input(s): HGBA1C in the last 72 hours. Fasting Lipid Panel No results for input(s): CHOL, HDL, LDLCALC, TRIG, CHOLHDL, LDLDIRECT in the last 72 hours. Thyroid Function Tests No results for input(s): TSH, T4TOTAL, T3FREE, THYROIDAB in the last 72 hours.  Invalid input(s): FREET3  Telemetry    Second-degree heart block with occasional probable A-V dissociation  ECG      Radiology    EEG  Result Date: 04/09/2019 Thana Farr, MD     04/09/2019  6:24 PM ELECTROENCEPHALOGRAM REPORT Patient: Kurt [name]       Room #: 248A-AA EEG No. ID: [id number] Age: 78 y.o.        Sex: male Referring Physician: Renae Gloss Report Date:  04/09/2019       Interpreting Physician: Thana Farr History: ADAIN GEURIN is an 78 y.o. male s/p a starring episode Medications: Pacerone, Eliquis, ASA, Aricept, Lasix, Amaryl, Insulin, Zestril, Metoprolol, Spironolactone Conditions of Recording:  This is a 21 channel routine scalp EEG performed with bipolar and monopolar montages arranged in accordance to the international 10/20 system of electrode placement. One channel was dedicated to EKG recording. The patient is in the awake state. Description:  Artifact is prominent during the recording often obscuring the background rhythm. When able to be visualized the background is slow and poorly organized.   It consists of a low voltage, polymorphic delta rhythm with occasional intermixed theta activity.  The maximum posterior background rhythm noted is a  theta activity that is poorly sustained. The patient does not drowse or sleep. No epileptiform activity is noted. Hyperventilation was not performed.  Intermittent photic stimulation was performed but failed to illicit any change in the tracing. IMPRESSION: This is an abnormal EEG secondary to general background slowing.  This finding may be seen with a diffuse disturbance  that is etiologically nonspecific, but may include a metabolic encephalopathy, among other possibilities.  No epileptiform activity was noted.  Thana Farr, MD Neurology [phone] 04/09/2019, 6:17 PM   CT HEAD WO CONTRAST  Result Date: 04/09/2019 CLINICAL DATA:  Altered mental status EXAM: CT HEAD WITHOUT CONTRAST TECHNIQUE: Contiguous axial images were obtained from the base of the skull through the vertex without intravenous contrast. COMPARISON:  02/28/2018 FINDINGS: Brain: No evidence of acute infarction, hemorrhage, extra-axial collection, ventriculomegaly, or mass effect. 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 Generalized cerebral atrophy. Periventricular white matter low attenuation likely secondary to microangiopathy. Vascular: Cerebrovascular atherosclerotic calcifications are noted. Skull: Negative for fracture or focal lesion. Sinuses/Orbits: Visualized portions of the orbits are unremarkable. Visualized portions of the paranasal sinuses and mastoid air cells are unremarkable. Other: None. IMPRESSION: No acute  intracranial pathology. Electronically Signed   By: Elige Ko   On: 04/09/2019 11:04   CARDIAC CATHETERIZATION  Result Date: 04/08/2019  Ost RCA to Prox RCA lesion is 100% stenosed with 100% stenosed side branch in RV Branch.  Prox Cx lesion is 50% stenosed.  Mid LM lesion is 60% stenosed.  1st Diag lesion is 80% stenosed.  Mid LAD lesion is 60% stenosed.  Ost LAD to Prox LAD lesion is 50% stenosed.  Left ventriculogram was not performed unable to cross into the ventricle  Conclusion Multivessel coronary disease including totally occluded proximal to mid right and appears to be a CTO in size vessel Large left main with distal 60% lesion Large LAD with moderate disease 50 to 60% Diagonal 1 with 80% ostial lesion Circumflex was medium in size and only had moderate disease Faint collaterals left to distal right Proximal left system had moderate to severe  calcification Recommend medical therapy   DG Chest Portable 1 View  Result Date: 04/05/2019 CLINICAL DATA:  Atrial fibrillation and chest pain EXAM: PORTABLE CHEST 1 VIEW COMPARISON:  02/25/2018 FINDINGS: Cardiac shadow is enlarged but stable. Aortic calcifications are again seen. Vascular congestion and interstitial edema is noted. No sizable effusion is seen. No focal infiltrate is noted. IMPRESSION: Changes consistent with congestive failure. Electronically Signed   By: Alcide Clever M.D.   On: 04/05/2019 23:42   ECHOCARDIOGRAM COMPLETE  Result Date: 04/06/2019   ECHOCARDIOGRAM REPORT   Patient Name:   Kurt [name] Ibrahim Date of Exam: 04/06/2019 Medical Rec #:  [id number]      Height:       75.0 in Accession #:    [id number]     Weight:       155.0 lb Date of Birth:  [birth year]      BSA:          1.97 m Patient Age:    78 years       BP:           110/59 mmHg Patient Gender: M              HR:           47 bpm. Exam Location:  [facility] Procedure: 2D Echo and Intracardiac Opacification Agent Indications:     NSTEMI  History:         Patient has prior history of Echocardiogram examinations. CHF,                  Arrythmias:Atrial Fibrillation; Risk Factors:Diabetes and                  Current Smoker.  Sonographer:     LTM Referring Phys:  [id number] Andris Baumann Diagnosing Phys: Harold Hedge MD IMPRESSIONS  1. Left ventricular ejection fraction, by visual estimation, is 20 to 25%. The left ventricle has severely decreased function. Left ventricular septal wall thickness was normal. Normal left ventricular posterior wall thickness. There is no left ventricular hypertrophy.  2. Definity contrast agent was given IV to delineate the left ventricular endocardial borders.  3. Mildly dilated left ventricular internal cavity size.  4. The left ventricle demonstrates global hypokinesis.  5. Global right ventricle has normal systolic function.The right ventricular size is normal. No increase in right ventricular wall  thickness.  6. Left atrial size was normal.  7. Right atrial size was mildly dilated.  8. The mitral valve is grossly normal. Mild to moderate mitral valve regurgitation. No evidence of mitral stenosis.  9. The tricuspid valve is not well visualized. Tricuspid valve regurgitation is trivial. 10. The aortic valve was not well visualized. Aortic valve regurgitation is trivial. 11. The pulmonic valve was not well visualized. Pulmonic valve regurgitation is trivial. 12. Mildly elevated pulmonary artery systolic pressure. 13. The atrial septum is grossly normal. FINDINGS  Left Ventricle: Left ventricular ejection fraction, by visual estimation, is 20 to 25%. The left ventricle has severely decreased function. Definity contrast agent was given IV to delineate the left ventricular endocardial borders. The left ventricle demonstrates global hypokinesis. The left ventricular internal cavity size was mildly dilated left ventricle. Normal left ventricular posterior wall thickness. There is no left ventricular hypertrophy. Right Ventricle: The right ventricular size is normal. No increase in right ventricular wall thickness. Global RV systolic function is has normal systolic function. The tricuspid regurgitant velocity is 2.49 m/s, and with an assumed right atrial pressure  of 10 mmHg, the estimated right ventricular systolic pressure is mildly elevated at 34.8 mmHg. Left Atrium: Left atrial size was normal in size. Right Atrium: Right atrial size was mildly dilated Pericardium: There is no evidence of pericardial effusion. Mitral Valve: The mitral valve is grossly normal. Mild to moderate mitral valve regurgitation. No evidence of mitral valve stenosis by observation. Tricuspid Valve: The tricuspid valve is not well visualized. Tricuspid valve regurgitation is trivial. Aortic Valve: The aortic valve was not well visualized. Aortic valve regurgitation is trivial. Aortic valve mean gradient measures 3.0 mmHg. Aortic valve peak  gradient measures 5.0 mmHg. Aortic valve area, by VTI measures 1.37 cm. Pulmonic Valve: The pulmonic valve was not well visualized. Pulmonic valve regurgitation is trivial. Pulmonic regurgitation is trivial. Aorta: The aortic root is normal in size and structure. IAS/Shunts: The atrial septum is grossly normal.  LEFT VENTRICLE PLAX 2D LVIDd:         4.70 cm       Diastology LVIDs:         4.34 cm       LV e' lateral:   4.62 cm/s LV PW:         0.75 cm       LV E/e' lateral: 18.1 LV IVS:        1.27 cm       LV e' medial:    4.13 cm/s LVOT diam:     1.70 cm       LV E/e' medial:  20.2 LV SV:         17 ml LV SV Index:   9.12 LVOT Area:     2.27 cm  LV Volumes (MOD) LV area d, A2C:    32.90 cm LV area d, A4C:    36.90 cm LV area s, A2C:    26.70 cm LV area s, A4C:    26.00 cm LV major d, A2C:   8.08 cm LV major d, A4C:   8.42 cm LV major s, A2C:   7.58 cm LV major s, A4C:   7.21 cm LV vol d, MOD A2C: 110.0 ml LV vol d, MOD A4C: 132.0 ml LV vol s, MOD A2C: 76.0 ml LV vol s, MOD A4C: 75.1 ml LV SV MOD A2C:     34.0 ml LV SV MOD A4C:     132.0 ml LV SV MOD BP:      45.5 ml RIGHT VENTRICLE RV S prime:     8.92 cm/s TAPSE (M-mode): 2.4 cm LEFT ATRIUM  Index LA diam:        1.70 cm 0.86 cm/m LA Vol (A2C):   58.7 ml 29.81 ml/m LA Vol (A4C):   42.3 ml 21.48 ml/m LA Biplane Vol: 50.8 ml 25.80 ml/m  AORTIC VALVE AV Area (Vmax):    1.29 cm AV Area (Vmean):   1.09 cm AV Area (VTI):     1.37 cm AV Vmax:           112.00 cm/s AV Vmean:          80.500 cm/s AV VTI:            0.236 m AV Peak Grad:      5.0 mmHg AV Mean Grad:      3.0 mmHg LVOT Vmax:         63.50 cm/s LVOT Vmean:        38.800 cm/s LVOT VTI:          0.142 m LVOT/AV VTI ratio: 0.60  AORTA Ao Root diam: 3.60 cm MITRAL VALVE                         TRICUSPID VALVE MV Area (PHT): 2.34 cm              TR Peak grad:   24.8 mmHg MV PHT:        94.00 msec            TR Vmax:        284.00 cm/s MV E velocity: 83.40 cm/s  103 cm/s MV A velocity:  130.00 cm/s 70.3 cm/s SHUNTS MV E/A ratio:  0.64        1.5       Systemic VTI:  0.14 m                                      Systemic Diam: 1.70 cm  Harold Hedge MD Electronically signed by Harold Hedge MD Signature Date/Time: 04/06/2019/2:25:05 PM    Final     Assessment & Plan     78 year old male with diabetes, tobacco abuse, peripheral vascular disease status post endovascular stent for AAA, history of atrial fibrillation and an known ejection fraction of 25% presented with wide-complex tachycardia. Had some chest heaviness during this episode. He is currently in sinus rhythm with intermittent second-degree heart block.Ruled in for non-ST elevation myocardial infarction   Wide-complex tachycardia cardia-probableVT.Marland Kitchen Received amiodarone with conversion to sinus rhythm with second-degree heart block. Somewhat bradycardic however fairly asymptomatic.  We will discontinue the metoprolol due to continued bradycardia attempt to continue with amiodarone at current dose.    Have discussed with electrophysiology.  We will continue with amiodarone 200 mg twice daily and will arrange transfer to [facility] when bed available for consideration for AICD/dual-chamber pacemaker.  Will remain on Eliquis as they hold the Eliquis night before placement of a device.  Cardiac cath revealed diffuse coronary artery disease. Poor candidate for coronary bypass grafting. Heavily calcified vessels limiting PCI approach. Will attempt medical management.   Cardiomyopathy-etiology unclear ischemic versus idiopathic. Patient not an ideal candidate for surgical intervention.  Cardiomyopathy likely has an ischemic component but appears out of proportion to his anatomy. Will attempt medical management with evidence-based heart failure treatment.  We will continue with careful diuresis and as well as lisinopril and spironolactone.  Will arrange transfer to [facility]  electrophysiology over the weekend for consideration for AICD placement and backup pacemaker.  I have called the transfer center.  Atrial fibrillation-we will remain on Eliquis after discussion with EP.  We will continue with amiodarone.  Peripheral vascular disease-status post endovascular stent for AAA. Being followed by vascular surgery as an outpatient.   Neurologic changes-CT unremarkable for acute changes.  EEG showed generalized slowing with no elective form activity.  Appreciate neurology evaluation.       Signed, Darlin Priestly Makaleigh Reinard MD 04/12/2019, 7:48 AM  Pager: [phone]

## 2019-04-12 NOTE — Progress Notes (Signed)
Physical Therapy Treatment Patient Details Name: Kurt [name] MRN: [id number] DOB: [birth year] Today's Date: 04/12/2019    History of Present Illness 78 yo male with history of probable ischemic cardiomyopathy admitted with wide-complex tachycardia converted to sinus rhythm.  Appeared to be probable ventricular tachycardia.  Patient ruled in for non-ST elevation myocardial infarction.  He has remained hemodynamically stable with no further sustained arrhythmia.  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  Left system had severe calcification.  Recommendation for medical management..   Patient is a poor surgical candidate. DUe to acute neuro deficits, stat CT performed this date, negative at this time. Pending neuro consult    PT Comments    Pt is making good progress towards goals with ability to ambulate to recliner. Still demonstrates decreased balance with all mobility and appears more confused this date. Found with incontinent episode in bed upon arrival. Agreeable to there-ex. HR appears WNL during mobility. Will continue to progress.  Follow Up Recommendations  SNF     Equipment Recommendations  Rolling walker with 5" wheels    Recommendations for Other Services       Precautions / Restrictions Precautions Precautions: Fall;Other (comment) Restrictions Weight Bearing Restrictions: No    Mobility  Bed Mobility Overal bed mobility: Needs Assistance Bed Mobility: Supine to Sit     Supine to sit: Min assist     General bed mobility comments: needs assist for sequencing and coming to sit at EOB. Heavy post leaning noted needing min assist to maintain balance.  Transfers Overall transfer level: Needs assistance Equipment used: Rolling walker (2 wheeled) Transfers: Sit to/from Stand Sit to Stand:  Mod assist         General transfer comment: bed elevated due to poor balance. Once standing, mod assist to maintain balance. LOB in post direction.  Ambulation/Gait Ambulation/Gait assistance: Min assist Gait Distance (Feet): 3 Feet Assistive device: Rolling walker (2 wheeled) Gait Pattern/deviations: Step-to pattern     General Gait Details: ambulated to recliner with slow step to gait pattern. Able to follow commands well with cautious steps. HR at 86bpm with exertion. Further ambulation distance deferred secondary to breakfasat tray arriving.   Stairs             Wheelchair Mobility    Modified Rankin (Stroke Patients Only)       Balance Overall balance assessment: Needs assistance Sitting-balance support: No upper extremity supported;Feet supported Sitting balance-Leahy Scale: Poor Sitting balance - Comments: post leaning noted   Standing balance support: Bilateral upper extremity supported Standing balance-Leahy Scale: Poor Standing balance comment: post leaning                            Cognition Arousal/Alertness: Awake/alert Behavior During Therapy: Flat affect Overall Cognitive Status: Impaired/Different from baseline                                 General Comments: Pt found naked and had taken his gown off in addition to urinating in the bed.      Exercises Other Exercises Other Exercises: supine/seated ther-ex performed on B LE including AP, SLRs, heel slides, LAQ, alt. marching, standing heel raises. 15 reps with supervision Other Exercises: 30 second stand test. Unable to  stand without mod assist. Able to achieve 7 reps with min/mod assist and cues for technique Other Exercises: Pt urinated in bed and had removed gown. New gown and bed change obtained. Once pt in recliner, therapist changed bed.    General Comments        Pertinent Vitals/Pain Pain Assessment: No/denies pain    Home Living                       Prior Function            PT Goals (current goals can now be found in the care plan section) Acute Rehab PT Goals Patient Stated Goal: go home PT Goal Formulation: With patient Time For Goal Achievement: 04/23/19 Potential to Achieve Goals: Good Progress towards PT goals: Progressing toward goals    Frequency    Min 2X/week      PT Plan Current plan remains appropriate    Co-evaluation              AM-PAC PT "6 Clicks" Mobility   Outcome Measure  Help needed turning from your back to your side while in a flat bed without using bedrails?: A Little Help needed moving from lying on your back to sitting on the side of a flat bed without using bedrails?: A Little Help needed moving to and from a bed to a chair (including a wheelchair)?: A Lot Help needed standing up from a chair using your arms (e.g., wheelchair or bedside chair)?: A Lot Help needed to walk in hospital room?: A Lot Help needed climbing 3-5 steps with a railing? : Total 6 Click Score: 13    End of Session Equipment Utilized During Treatment: Gait belt Activity Tolerance: Patient tolerated treatment well Patient left: in chair;with chair alarm set Nurse Communication: Mobility status PT Visit Diagnosis: Unsteadiness on feet (R26.81);Muscle weakness (generalized) (M62.81);Difficulty in walking, not elsewhere classified (R26.2)     Time: 3545-6256 PT Time Calculation (min) (ACUTE ONLY): 26 min  Charges:  $Gait Training: 8-22 mins $Therapeutic Exercise: 8-22 mins                     Greggory Stallion, PT, DPT [phone]    Biddie Sebek 04/12/2019, 12:01 PM

## 2019-04-12 NOTE — Progress Notes (Signed)
PROGRESS NOTE    Kurt [name]  ZOX:[id number] DOB: 1940/05/09 DOA: 04/05/2019 PCP: Jaclyn Shaggy, MD      Assessment & Plan:   Principal Problem:   Wide-complex tachycardia (HCC) Active Problems:   Tobacco dependence   Abdominal aortic aneurysm without rupture (HCC)   PVD (peripheral vascular disease) (HCC)   Rapid atrial fibrillation (HCC)   RBBB   Hyperglycemia due to type 2 diabetes mellitus (HCC)   Acute on chronic diastolic CHF (congestive heart failure) (HCC)   NSTEMI (non-ST elevated myocardial infarction) (HCC)   Atrial flutter with rapid ventricular response (HCC)   Atrial fibrillation, chronic (HCC)   Decubitus ulcer of sacral region, stage 2 (HCC)   Acute metabolic encephalopathy   Acute metabolic encephalopathy: etiology unclear. No hx of dementia, has poor short term memory as per pt's caregiver. Continue w/ neuro checks. EEG shows generalized slowing but no epileptiform activity. Neuro following and recs apprec. CT brain was neg.   A. Fib: likely PAF. Continue on amiodarone, eliquis. Continue on tele. Wide complex tachycardia on presentation. Cardio following and recs apprec   Arrhythmia: bradycardic intermittently and wide complex tachycardia, possibly VT. Metoprolol d/c as per cardio. Continue on amiodarone. Will be transferred to [facility] for evaluation for AICD/pacemaker as per cardio.   Acute on chronic systolic CHF exacerbation: w/ cardiomyopathy. EF of 20-25%. Continue on lasix, spironolactone, & lisinopril. D/C metoprolol for bradycardia as per cardio.   Elevated troponins: likely secondary to demand ischemia. Medical management recommended by cardiology.  DM2: w/ hyperglycemia. HbA1c 7.8. Continue on glargine & SSI w/ accuchecks.   Hx of AAA: managed by outpatient PCP  Stage II decubitus ulcer: continue w/ wound care  Generalized weakness: PT recommending SNF. CM is aware     DVT prophylaxis: eliquis Code Status: full    Family Communication: Disposition Plan: Will be transferred to [facility] for evaluation for AICD/pacemaker. Accepting physician, Dr. Arizona Constable. D/C orders to transfer the pt have been placed. Awaiting a bed     Consultants:   Neuro  cardio   Procedures:   Echo 04/06/19: IMPRESSIONS    1. Left ventricular ejection fraction, by visual estimation, is 20 to 25%. The left ventricle has severely decreased function. Left ventricular septal wall thickness was normal. Normal left ventricular posterior wall thickness. There is no left  ventricular hypertrophy.  2. Definity contrast agent was given IV to delineate the left ventricular endocardial borders.  3. Mildly dilated left ventricular internal cavity size.  4. The left ventricle demonstrates global hypokinesis.  5. Global right ventricle has normal systolic function.The right ventricular size is normal. No increase in right ventricular wall thickness.  6. Left atrial size was normal.  7. Right atrial size was mildly dilated.  8. The mitral valve is grossly normal. Mild to moderate mitral valve regurgitation. No evidence of mitral stenosis.  9. The tricuspid valve is not well visualized. Tricuspid valve regurgitation is trivial. 10. The aortic valve was not well visualized. Aortic valve regurgitation is trivial. 11. The pulmonic valve was not well visualized. Pulmonic valve regurgitation is trivial. 12. Mildly elevated pulmonary artery systolic pressure. 13. The atrial septum is grossly normal.    Antimicrobials: n/a   Subjective: Pt c/o fatigue  Objective: Vitals:   04/11/19 0853 04/11/19 1737 04/11/19 1931 04/12/19 0241  BP: 124/67 138/74 136/82 (!) 151/58  Pulse: 71 72 70 (!) 55  Resp:   20 16  Temp:  97.7 F (36.5  C) 98.5 F (36.9 C) 98.6 F (37 C)  TempSrc:  Oral Oral Oral  SpO2: 100%  100% 97%  Weight:    62.6 kg  Height:        Intake/Output Summary (Last 24 hours) at 04/12/2019  0816 Last data filed at 04/11/2019 2302 Gross per 24 hour  Intake 123 ml  Output 600 ml  Net -477 ml   Filed Weights   04/10/19 0428 04/11/19 0456 04/12/19 0241  Weight: 63.4 kg 63 kg 62.6 kg    Examination:  General exam: Appears calm and comfortable  Respiratory system: Clear to auscultation. Respiratory effort normal. Cardiovascular system: S1 & S2 present. No rubs, gallops or clicks.  Gastrointestinal system: Abdomen is nondistended, soft and nontender.. Normal bowel sounds heard. Central nervous system: Alert and oriented. Moves all 4 extremities Psychiatry: normal appropriate mood and affect.     Data Reviewed: I have personally reviewed following labs and imaging studies  CBC: Recent Labs  Lab 04/05/19 1953 04/07/19 0534 04/08/19 0551 04/09/19 0559 04/11/19 0443 04/12/19 0429  WBC 8.6 8.5 6.3 5.6 7.0 7.7  NEUTROABS 6.6  --   --   --   --   --   HGB 14.0 11.9* 12.0* 13.0 14.2 13.7  HCT 40.9 35.2* 33.4* 36.9* 40.8 39.4  MCV 97.4 95.7 92.0 92.7 95.1 95.9  PLT 144* 118* 126* 135* 173 202   Basic Metabolic Panel: Recent Labs  Lab 04/05/19 2241 04/08/19 0551 04/09/19 0559 04/10/19 0448 04/11/19 0443 04/12/19 0429  NA  --  139 141 141 136 137  K  --  3.3* 4.1 4.2 4.5 4.5  CL  --  108 108 105 104 104  CO2  --  22 23 25 22 24   GLUCOSE  --  101* 149* 139* 179* 153*  BUN  --  21 15 12 18 23   CREATININE  --  1.17 0.96 1.04 1.05 1.10  CALCIUM  --  8.4* 8.8* 9.2 9.4 9.3  MG 1.6*  --   --   --   --  1.9   GFR: Estimated Creatinine Clearance: 50.6 mL/min (by C-G formula based on SCr of 1.1 mg/dL). Liver Function Tests: Recent Labs  Lab 04/05/19 1953  AST 87*  ALT 61*  ALKPHOS 124  BILITOT 0.8  PROT 6.7  ALBUMIN 3.7   No results for input(s): LIPASE, AMYLASE in the last 168 hours. No results for input(s): AMMONIA in the last 168 hours. Coagulation Profile: Recent Labs  Lab 04/06/19 0333  INR 1.2   Cardiac Enzymes: No results for input(s):  CKTOTAL, CKMB, CKMBINDEX, TROPONINI in the last 168 hours. BNP (last 3 results) No results for input(s): PROBNP in the last 8760 hours. HbA1C: No results for input(s): HGBA1C in the last 72 hours. CBG: Recent Labs  Lab 04/10/19 2113 04/11/19 0854 04/11/19 1224 04/11/19 1739 04/11/19 2115  GLUCAP 144* 181* 277* 107* 138*   Lipid Profile: No results for input(s): CHOL, HDL, LDLCALC, TRIG, CHOLHDL, LDLDIRECT in the last 72 hours. Thyroid Function Tests: No results for input(s): TSH, T4TOTAL, FREET4, T3FREE, THYROIDAB in the last 72 hours. Anemia Panel: Recent Labs    04/09/19 1610  VITAMINB12 610  FOLATE 22.0   Sepsis Labs: No results for input(s): PROCALCITON, LATICACIDVEN in the last 168 hours.  Recent Results (from the past 240 hour(s))  SARS CORONAVIRUS 2 (TAT 6-24 HRS) Nasopharyngeal Nasopharyngeal Swab     Status: None   Collection Time: 04/05/19 11:36 PM   Specimen: Nasopharyngeal  Swab  Result Value Ref Range Status   SARS Coronavirus 2 NEGATIVE NEGATIVE Final    Comment: (NOTE) SARS-CoV-2 target nucleic acids are NOT DETECTED. The SARS-CoV-2 RNA is generally detectable in upper and lower respiratory specimens during the acute phase of infection. Negative results do not preclude SARS-CoV-2 infection, do not rule out co-infections with other pathogens, and should not be used as the sole basis for treatment or other patient management decisions. Negative results must be combined with clinical observations, patient history, and epidemiological information. The expected result is Negative. Fact Sheet for Patients: HairSlick.nohttps://www.fda.gov/media/138098/download Fact Sheet for Healthcare Providers: quierodirigir.comhttps://www.fda.gov/media/138095/download This test is not yet approved or cleared by the [place] FDA and  has been authorized for detection and/or diagnosis of SARS-CoV-2 by FDA under an Emergency Use Authorization (EUA). This EUA will remain  in effect (meaning this  test can be used) for the duration of the COVID-19 declaration under Section 56 4(b)(1) of the Act, 21 U.S.C. section 360bbb-3(b)(1), unless the authorization is terminated or revoked sooner. Performed at [facility], [address] [place], [place] [address]   SARS CORONAVIRUS 2 (TAT 6-24 HRS) Nasopharyngeal Nasopharyngeal Swab     Status: None   Collection Time: 04/11/19  2:45 PM   Specimen: Nasopharyngeal Swab  Result Value Ref Range Status   SARS Coronavirus 2 NEGATIVE NEGATIVE Final    Comment: (NOTE) SARS-CoV-2 target nucleic acids are NOT DETECTED. The SARS-CoV-2 RNA is generally detectable in upper and lower respiratory specimens during the acute phase of infection. Negative results do not preclude SARS-CoV-2 infection, do not rule out co-infections with other pathogens, and should not be used as the sole basis for treatment or other patient management decisions. Negative results must be combined with clinical observations, patient history, and epidemiological information. The expected result is Negative. Fact Sheet for Patients: HairSlick.nohttps://www.fda.gov/media/138098/download Fact Sheet for Healthcare Providers: quierodirigir.comhttps://www.fda.gov/media/138095/download This test is not yet approved or cleared by the [place] FDA and  has been authorized for detection and/or diagnosis of SARS-CoV-2 by FDA under an Emergency Use Authorization (EUA). This EUA will remain  in effect (meaning this test can be used) for the duration of the COVID-19 declaration under Section 56 4(b)(1) of the Act, 21 U.S.C. section 360bbb-3(b)(1), unless the authorization is terminated or revoked sooner. Performed at [facility], [address] [place], [place] [address]          Radiology Studies: No results found.      Scheduled Meds: . amiodarone  200 mg Oral Daily  . aspirin EC  81 mg Oral Daily  . docusate sodium  100 mg Oral BID  . donepezil  5 mg Oral Daily  . feeding  supplement (NEPRO CARB STEADY)  237 mL Oral BID BM  . furosemide  20 mg Oral Daily  . glimepiride  2 mg Oral Q breakfast  . insulin aspart  0-5 Units Subcutaneous QHS  . insulin aspart  0-9 Units Subcutaneous TID WC  . lisinopril  5 mg Oral Daily  . multivitamin with minerals  1 tablet Oral Daily  . potassium chloride  20 mEq Oral BID  . sodium chloride flush  3 mL Intravenous Q12H  . sodium chloride flush  3 mL Intravenous Q12H  . spironolactone  12.5 mg Oral Daily   Continuous Infusions: . sodium chloride       LOS: 6 days    Time spent: 30 mins   Charise KillianJamiese M Jessye Imhoff, MD [facility] Pager [phone]  [phone]  If 7PM-7AM, please contact night-coverage [email or web address] Password [email or web address] 04/12/2019, 8:16 AM

## 2019-04-12 NOTE — Progress Notes (Signed)
Notified NP about the pt being in 2nd degree, now at times going into complete heart block. Pt is asymptomatic, vital signs within normal limits. No new orders. Will continue to monitor.

## 2019-04-13 LAB — VITAMIN B1: Vitamin B1 (Thiamine): 109.4 nmol/L (ref 66.5–200.0)

## 2019-04-16 ENCOUNTER — Ambulatory Visit: Payer: Medicare Other | Admitting: Family

## 2019-04-24 ENCOUNTER — Other Ambulatory Visit
Admission: RE | Admit: 2019-04-24 | Discharge: 2019-04-24 | Disposition: A | Discharge status: Home / Self Care | Payer: Medicare Other | Source: Ambulatory Visit | Attending: Internal Medicine | Admitting: Internal Medicine

## 2019-04-24 DIAGNOSIS — Z79899 Other long term (current) drug therapy: Secondary | ICD-10-CM | POA: Insufficient documentation

## 2019-04-24 LAB — BRAIN NATRIURETIC PEPTIDE: B Natriuretic Peptide: 219 pg/mL — ABNORMAL HIGH (ref 0.0–100.0)

## 2019-06-07 ENCOUNTER — Encounter: Payer: Medicare PPO | Attending: Internal Medicine | Admitting: *Deleted

## 2019-06-07 ENCOUNTER — Other Ambulatory Visit: Payer: Self-pay

## 2019-06-07 DIAGNOSIS — I214 Non-ST elevation (NSTEMI) myocardial infarction: Secondary | ICD-10-CM

## 2019-06-07 DIAGNOSIS — Z7951 Long term (current) use of inhaled steroids: Secondary | ICD-10-CM | POA: Insufficient documentation

## 2019-06-07 DIAGNOSIS — Z7902 Long term (current) use of antithrombotics/antiplatelets: Secondary | ICD-10-CM | POA: Insufficient documentation

## 2019-06-07 DIAGNOSIS — Z7984 Long term (current) use of oral hypoglycemic drugs: Secondary | ICD-10-CM | POA: Insufficient documentation

## 2019-06-07 DIAGNOSIS — Z7901 Long term (current) use of anticoagulants: Secondary | ICD-10-CM | POA: Insufficient documentation

## 2019-06-07 DIAGNOSIS — Z79899 Other long term (current) drug therapy: Secondary | ICD-10-CM | POA: Insufficient documentation

## 2019-06-07 DIAGNOSIS — F17200 Nicotine dependence, unspecified, uncomplicated: Secondary | ICD-10-CM | POA: Insufficient documentation

## 2019-06-07 DIAGNOSIS — E119 Type 2 diabetes mellitus without complications: Secondary | ICD-10-CM | POA: Insufficient documentation

## 2019-06-07 NOTE — Progress Notes (Signed)
Initial phone orientation completed. Diagnosis can be found in [facility] 12/11. EP orientation scheduled for 2/15 at 3pm

## 2019-06-10 ENCOUNTER — Other Ambulatory Visit: Payer: Self-pay

## 2019-06-10 ENCOUNTER — Encounter: Payer: Medicare PPO | Admitting: *Deleted

## 2019-06-10 VITALS — Ht 73.1 in | Wt 141.2 lb

## 2019-06-10 DIAGNOSIS — I214 Non-ST elevation (NSTEMI) myocardial infarction: Secondary | ICD-10-CM

## 2019-06-10 DIAGNOSIS — Z7902 Long term (current) use of antithrombotics/antiplatelets: Secondary | ICD-10-CM | POA: Diagnosis not present

## 2019-06-10 DIAGNOSIS — Z7951 Long term (current) use of inhaled steroids: Secondary | ICD-10-CM | POA: Diagnosis not present

## 2019-06-10 DIAGNOSIS — Z79899 Other long term (current) drug therapy: Secondary | ICD-10-CM | POA: Diagnosis not present

## 2019-06-10 DIAGNOSIS — E119 Type 2 diabetes mellitus without complications: Secondary | ICD-10-CM | POA: Diagnosis not present

## 2019-06-10 DIAGNOSIS — Z7984 Long term (current) use of oral hypoglycemic drugs: Secondary | ICD-10-CM | POA: Diagnosis not present

## 2019-06-10 DIAGNOSIS — F17200 Nicotine dependence, unspecified, uncomplicated: Secondary | ICD-10-CM | POA: Diagnosis not present

## 2019-06-10 DIAGNOSIS — Z7901 Long term (current) use of anticoagulants: Secondary | ICD-10-CM | POA: Diagnosis not present

## 2019-06-10 NOTE — Patient Instructions (Signed)
Patient Instructions  Patient Details  Name: Kurt [name] MRN: [id number] Date of Birth: 28-Aug-1940 Referring Provider:  Alwyn Pea, MD  Below are your personal goals for exercise, nutrition, and risk factors. Our goal is to help you stay on track towards obtaining and maintaining these goals. We will be discussing your progress on these goals with you throughout the program.  Initial Exercise Prescription: Initial Exercise Prescription - 06/10/19 1600      Date of Initial Exercise RX and Referring Provider   Date  06/10/19    Referring Provider  Dorothyann Peng MD      Recumbant Bike   Level  1    RPM  50    Watts  10    Minutes  15    METs  1.2      NuStep   Level  1    SPM  80    Minutes  15    METs  1.2      Arm Ergometer   Level  1    Watts  10    RPM  25    Minutes  15    METs  1.2      Biostep-RELP   Level  1    SPM  50    Minutes  15    METs  1      Prescription Details   Frequency (times per week)  3    Duration  Progress to 30 minutes of continuous aerobic without signs/symptoms of physical distress      Intensity   THRR 40-80% of Max Heartrate  102-130    Ratings of Perceived Exertion  11-13    Perceived Dyspnea  0-4      Progression   Progression  Continue to progress workloads to maintain intensity without signs/symptoms of physical distress.      Resistance Training   Training Prescription  Yes    Weight  3 lb    Reps  10-15       Exercise Goals: Frequency: Be able to perform aerobic exercise two to three times per week in program working toward 2-5 days per week of home exercise.  Intensity: Work with a perceived exertion of 11 (fairly light) - 15 (hard) while following your exercise prescription.  We will make changes to your prescription with you as you progress through the program.   Duration: Be able to do 30 to 45 minutes of continuous aerobic exercise in addition to a 5 minute warm-up and a 5 minute cool-down routine.    Nutrition Goals: Your personal nutrition goals will be established when you do your nutrition analysis with the dietician.  The following are general nutrition guidelines to follow: Cholesterol < 200mg /day Sodium < 1500mg /day Fiber: Men over 50 yrs - 30 grams per day  Personal Goals: Personal Goals and Risk Factors at Admission - 06/10/19 1637      Core Components/Risk Factors/Patient Goals on Admission    Weight Management  Yes;Weight Gain    Intervention  Weight Management: Develop a combined nutrition and exercise program designed to reach desired caloric intake, while maintaining appropriate intake of nutrient and fiber, sodium and fats, and appropriate energy expenditure required for the weight goal.;Weight Management: Provide education and appropriate resources to help participant work on and attain dietary goals.    Admit Weight  141 lb 3.2 oz (64 kg)    Goal Weight: Short Term  145 lb (65.8 kg)    Goal Weight: Long  Term  150 lb (68 kg)    Expected Outcomes  Short Term: Continue to assess and modify interventions until short term weight is achieved;Long Term: Adherence to nutrition and physical activity/exercise program aimed toward attainment of established weight goal;Weight Gain: Understanding of general recommendations for a high calorie, high protein meal plan that promotes weight gain by distributing calorie intake throughout the day with the consumption for 4-5 meals, snacks, and/or supplements    Tobacco Cessation  Yes    Number of packs per day  1.5 packs    Intervention  Assist the participant in steps to quit. Provide individualized education and counseling about committing to Tobacco Cessation, relapse prevention, and pharmacological support that can be provided by physician.;Advice worker, assist with locating and accessing local/national Quit Smoking programs, and support quit date choice.    Expected Outcomes  Short Term: Will demonstrate readiness to  quit, by selecting a quit date.;Short Term: Will quit all tobacco product use, adhering to prevention of relapse plan.;Long Term: Complete abstinence from all tobacco products for at least 12 months from quit date.    Diabetes  Yes    Intervention  Provide education about signs/symptoms and action to take for hypo/hyperglycemia.;Provide education about proper nutrition, including hydration, and aerobic/resistive exercise prescription along with prescribed medications to achieve blood glucose in normal ranges: Fasting glucose 65-99 mg/dL    Expected Outcomes  Short Term: Participant verbalizes understanding of the signs/symptoms and immediate care of hyper/hypoglycemia, proper foot care and importance of medication, aerobic/resistive exercise and nutrition plan for blood glucose control.;Long Term: Attainment of HbA1C < 7%.    Heart Failure  Yes    Intervention  Provide a combined exercise and nutrition program that is supplemented with education, support and counseling about heart failure. Directed toward relieving symptoms such as shortness of breath, decreased exercise tolerance, and extremity edema.    Expected Outcomes  Improve functional capacity of life;Short term: Attendance in program 2-3 days a week with increased exercise capacity. Reported lower sodium intake. Reported increased fruit and vegetable intake. Reports medication compliance.;Short term: Daily weights obtained and reported for increase. Utilizing diuretic protocols set by physician.;Long term: Adoption of self-care skills and reduction of barriers for early signs and symptoms recognition and intervention leading to self-care maintenance.    Hypertension  Yes    Intervention  Provide education on lifestyle modifcations including regular physical activity/exercise, weight management, moderate sodium restriction and increased consumption of fresh fruit, vegetables, and low fat dairy, alcohol moderation, and smoking cessation.;Monitor  prescription use compliance.    Expected Outcomes  Short Term: Continued assessment and intervention until BP is < 140/48mm HG in hypertensive participants. < 130/40mm HG in hypertensive participants with diabetes, heart failure or chronic kidney disease.;Long Term: Maintenance of blood pressure at goal levels.    Lipids  Yes    Intervention  Provide education and support for participant on nutrition & aerobic/resistive exercise along with prescribed medications to achieve LDL 70mg , HDL >40mg .    Expected Outcomes  Short Term: Participant states understanding of desired cholesterol values and is compliant with medications prescribed. Participant is following exercise prescription and nutrition guidelines.;Long Term: Cholesterol controlled with medications as prescribed, with individualized exercise RX and with personalized nutrition plan. Value goals: LDL < 70mg , HDL > 40 mg.       Tobacco Use Initial Evaluation: Social History   Tobacco Use  Smoking Status Heavy Tobacco Smoker  Smokeless Tobacco Current User    Exercise Goals and Review: Exercise  Goals    Row Name 06/10/19 1636             Exercise Goals   Increase Physical Activity  Yes       Intervention  Provide advice, education, support and counseling about physical activity/exercise needs.;Develop an individualized exercise prescription for aerobic and resistive training based on initial evaluation findings, risk stratification, comorbidities and participant's personal goals.       Expected Outcomes  Long Term: Add in home exercise to make exercise part of routine and to increase amount of physical activity.;Short Term: Attend rehab on a regular basis to increase amount of physical activity.;Long Term: Exercising regularly at least 3-5 days a week.       Increase Strength and Stamina  Yes       Intervention  Provide advice, education, support and counseling about physical activity/exercise needs.;Develop an individualized  exercise prescription for aerobic and resistive training based on initial evaluation findings, risk stratification, comorbidities and participant's personal goals.       Expected Outcomes  Short Term: Increase workloads from initial exercise prescription for resistance, speed, and METs.;Short Term: Perform resistance training exercises routinely during rehab and add in resistance training at home;Long Term: Improve cardiorespiratory fitness, muscular endurance and strength as measured by increased METs and functional capacity ( )       Able to understand and use rate of perceived exertion (RPE) scale  Yes       Intervention  Provide education and explanation on how to use RPE scale       Expected Outcomes  Short Term: Able to use RPE daily in rehab to express subjective intensity level;Long Term:  Able to use RPE to guide intensity level when exercising independently       Able to understand and use Dyspnea scale  Yes       Intervention  Provide education and explanation on how to use Dyspnea scale       Expected Outcomes  Short Term: Able to use Dyspnea scale daily in rehab to express subjective sense of shortness of breath during exertion;Long Term: Able to use Dyspnea scale to guide intensity level when exercising independently       Knowledge and understanding of Target Heart Rate Range (THRR)  Yes       Intervention  Provide education and explanation of THRR including how the numbers were predicted and where they are located for reference       Expected Outcomes  Short Term: Able to state/look up THRR;Short Term: Able to use daily as guideline for intensity in rehab;Long Term: Able to use THRR to govern intensity when exercising independently       Able to check pulse independently  Yes       Intervention  Provide education and demonstration on how to check pulse in carotid and radial arteries.;Review the importance of being able to check your own pulse for safety during independent exercise        Expected Outcomes  Short Term: Able to explain why pulse checking is important during independent exercise;Long Term: Able to check pulse independently and accurately       Understanding of Exercise Prescription  Yes       Intervention  Provide education, explanation, and written materials on patient's individual exercise prescription       Expected Outcomes  Long Term: Able to explain home exercise prescription to exercise independently;Short Term: Able to explain program exercise prescription  Copy of goals given to participant.

## 2019-06-10 NOTE — Progress Notes (Signed)
Cardiac Individual Treatment Plan  Patient Details  Name: Kurt [name] MRN: [id number] Date of Birth: Feb 18, 1941 Referring Provider:     Cardiac Rehab from 06/10/2019 in [facility] Cardiac and Pulmonary Rehab  Referring Provider  Lujean Amel MD      Initial Encounter Date:    Cardiac Rehab from 06/10/2019 in [facility] Cardiac and Pulmonary Rehab  Date  06/10/19      Visit Diagnosis: NSTEMI (non-ST elevated myocardial infarction) [facility])  Patient's Home Medications on Admission:  Current Outpatient Medications:  .  amiodarone (PACERONE) 200 MG tablet, Take 1 tablet (200 mg total) by mouth 2 (two) times daily., Disp: 60 tablet, Rfl: 0 .  amiodarone (PACERONE) 200 MG tablet, Take 200 mg by mouth daily. , Disp: , Rfl:  .  apixaban (ELIQUIS) 5 MG TABS tablet, Take 1 tablet (5 mg total) by mouth 2 (two) times daily., Disp: 60 tablet, Rfl: 0 .  budesonide-formoterol (SYMBICORT) 80-4.5 MCG/ACT inhaler, INHALE 2 PUFFS BY MOUTH TWICE A DAY (RINSE MOUTH WELL WITH WATER AFTER EACH USE), Disp: , Rfl:  .  Cholecalciferol (D-5000) 5000 units TABS, Take 5,000 Units by mouth daily. , Disp: , Rfl:  .  clopidogrel (PLAVIX) 75 MG tablet, Take 1 tablet (75 mg total) by mouth daily., Disp: 30 tablet, Rfl: 0 .  co-enzyme Q-10 30 MG capsule, Take 30 mg by mouth 3 (three) times daily., Disp: , Rfl:  .  docusate sodium (COLACE) 100 MG capsule, Take 100 mg by mouth 2 (two) times daily., Disp: , Rfl:  .  donepezil (ARICEPT) 5 MG tablet, Take 1 tablet (5 mg total) by mouth at bedtime., Disp: 30 tablet, Rfl: 0 .  furosemide (LASIX) 20 MG tablet, Take 1 tablet (20 mg total) by mouth daily., Disp: 30 tablet, Rfl: 0 .  furosemide (LASIX) 20 MG tablet, Take 20 mg by mouth daily. , Disp: , Rfl:  .  Garlic Oil 8250 MG CAPS, Take 1,000 mg by mouth daily as needed (cholesterol). , Disp: , Rfl:  .  glimepiride (AMARYL) 2 MG tablet, Take 2 mg by mouth 2 (two) times daily with a meal., Disp: , Rfl:  .  glucose blood (ACCU-CHEK  AVIVA PLUS) test strip, USE TO CHECK GLUCOSE TWICE DAILY, Disp: , Rfl:  .  lisinopril (ZESTRIL) 5 MG tablet, Take 1 tablet (5 mg total) by mouth daily., Disp: 30 tablet, Rfl: 0 .  metFORMIN (GLUCOPHAGE) 500 MG tablet, Take 1,000 mg by mouth daily with breakfast. , Disp: , Rfl:  .  Misc Natural Products (PROSTATE THERAPY COMPLEX PO), Take 562 mg by mouth daily. , Disp: , Rfl:  .  ondansetron (ZOFRAN) 4 MG tablet, Take by mouth., Disp: , Rfl:  .  Red Yeast Rice 600 MG CAPS, Take 1 capsule by mouth daily. , Disp: , Rfl:  .  senna (SENOKOT) 8.6 MG tablet, TAKE ONE TABLET BY MOUTH TWICE A DAY AS NEEDED, Disp: , Rfl:  .  spironolactone (ALDACTONE) 25 MG tablet, Take 0.5 tablets (12.5 mg total) by mouth daily., Disp: 15 tablet, Rfl: 0 .  spironolactone (ALDACTONE) 25 MG tablet, Take by mouth., Disp: , Rfl:  .  tiotropium (SPIRIVA) 18 MCG inhalation capsule, INHALE 1 CAPSULE IN INHALER BY MOUTH DAILY, Disp: , Rfl:  .  vitamin B-12 (CYANOCOBALAMIN) 1000 MCG tablet, Take 1,000 mcg by mouth daily. , Disp: , Rfl:   Past Medical History: Past Medical History:  Diagnosis Date  . AAA (abdominal aortic aneurysm) ([place])   . Diabetes mellitus  without complication (HCC)     Tobacco Use: Social History   Tobacco Use  Smoking Status Heavy Tobacco Smoker  Smokeless Tobacco Current User    Labs: Recent Review Flowsheet Data    Labs for ITP Cardiac and Pulmonary Rehab Latest Ref Rng & Units 03/01/2018 04/06/2019   Cholestrol 0 - 200 mg/dL 168 154   LDLCALC 0 - 99 mg/dL 110(H) 101(H)   HDL >40 mg/dL 31(L) 31(L)   Trlycerides <150 mg/dL 137 109   Hemoglobin A1c 4.8 - 5.6 % 6.6(H) 7.8(H)       Exercise Target Goals: Exercise Program Goal: Individual exercise prescription set using results from initial 6 min walk test and THRR while considering  patient's activity barriers and safety.   Exercise Prescription Goal: Initial exercise prescription builds to 30-45 minutes a day of aerobic activity, 2-3 days  per week.  Home exercise guidelines will be given to patient during program as part of exercise prescription that the participant will acknowledge.  Activity Barriers & Risk Stratification: Activity Barriers & Cardiac Risk Stratification - 06/10/19 1634      Activity Barriers & Cardiac Risk Stratification   Activity Barriers  Balance Concerns;Muscular Weakness;Deconditioning;Shortness of Breath;History of Falls;Assistive Device;Decreased Ventricular Function    Cardiac Risk Stratification  High       6 Minute Walk: 6 Minute Walk    Row Name 06/10/19 1632         6 Minute Walk   Phase  Initial     Distance  180 feet     Walk Time  6 minutes     # of Rest Breaks  0     MPH  0.34     METS  1.22     RPE  11     VO2 Peak  4.27     Symptoms  Yes (comment)     Comments  shuffle gait, leaning on railing and cane, wearing slides     Resting HR  76 bpm     Resting BP  112/60     Resting Oxygen Saturation   97 %     Exercise Oxygen Saturation  during 6 min walk  98 %     Max Ex. HR  86 bpm     Max Ex. BP  144/64     2 Minute Post BP  124/58        Oxygen Initial Assessment:   Oxygen Re-Evaluation:   Oxygen Discharge (Final Oxygen Re-Evaluation):   Initial Exercise Prescription: Initial Exercise Prescription - 06/10/19 1600      Date of Initial Exercise RX and Referring Provider   Date  06/10/19    Referring Provider  Lujean Amel MD      Recumbant Bike   Level  1    RPM  50    Watts  10    Minutes  15    METs  1.2      NuStep   Level  1    SPM  80    Minutes  15    METs  1.2      Arm Ergometer   Level  1    Watts  10    RPM  25    Minutes  15    METs  1.2      Biostep-RELP   Level  1    SPM  50    Minutes  15    METs  1      Prescription  Details   Frequency (times per week)  3    Duration  Progress to 30 minutes of continuous aerobic without signs/symptoms of physical distress      Intensity   THRR 40-80% of Max Heartrate  102-130     Ratings of Perceived Exertion  11-13    Perceived Dyspnea  0-4      Progression   Progression  Continue to progress workloads to maintain intensity without signs/symptoms of physical distress.      Resistance Training   Training Prescription  Yes    Weight  3 lb    Reps  10-15       Perform Capillary Blood Glucose checks as needed.  Exercise Prescription Changes: Exercise Prescription Changes    Row Name 06/10/19 1600             Response to Exercise   Blood Pressure (Admit)  112/60       Blood Pressure (Exercise)  144/64       Blood Pressure (Exit)  124/58       Heart Rate (Admit)  76 bpm       Heart Rate (Exercise)  86 bpm       Heart Rate (Exit)  66 bpm       Oxygen Saturation (Admit)  94 %       Oxygen Saturation (Exercise)  98 %       Rating of Perceived Exertion (Exercise)  11       Symptoms  wearing slides, shuffle gait, leaning on can and railing, weakness       Comments  walk test results          Exercise Comments:   Exercise Goals and Review: Exercise Goals    Row Name 06/10/19 1636             Exercise Goals   Increase Physical Activity  Yes       Intervention  Provide advice, education, support and counseling about physical activity/exercise needs.;Develop an individualized exercise prescription for aerobic and resistive training based on initial evaluation findings, risk stratification, comorbidities and participant's personal goals.       Expected Outcomes  Long Term: Add in home exercise to make exercise part of routine and to increase amount of physical activity.;Short Term: Attend rehab on a regular basis to increase amount of physical activity.;Long Term: Exercising regularly at least 3-5 days a week.       Increase Strength and Stamina  Yes       Intervention  Provide advice, education, support and counseling about physical activity/exercise needs.;Develop an individualized exercise prescription for aerobic and resistive training based on  initial evaluation findings, risk stratification, comorbidities and participant's personal goals.       Expected Outcomes  Short Term: Increase workloads from initial exercise prescription for resistance, speed, and METs.;Short Term: Perform resistance training exercises routinely during rehab and add in resistance training at home;Long Term: Improve cardiorespiratory fitness, muscular endurance and strength as measured by increased METs and functional capacity (6MWT)       Able to understand and use rate of perceived exertion (RPE) scale  Yes       Intervention  Provide education and explanation on how to use RPE scale       Expected Outcomes  Short Term: Able to use RPE daily in rehab to express subjective intensity level;Long Term:  Able to use RPE to guide intensity level when exercising independently  Able to understand and use Dyspnea scale  Yes       Intervention  Provide education and explanation on how to use Dyspnea scale       Expected Outcomes  Short Term: Able to use Dyspnea scale daily in rehab to express subjective sense of shortness of breath during exertion;Long Term: Able to use Dyspnea scale to guide intensity level when exercising independently       Knowledge and understanding of Target Heart Rate Range (THRR)  Yes       Intervention  Provide education and explanation of THRR including how the numbers were predicted and where they are located for reference       Expected Outcomes  Short Term: Able to state/look up THRR;Short Term: Able to use daily as guideline for intensity in rehab;Long Term: Able to use THRR to govern intensity when exercising independently       Able to check pulse independently  Yes       Intervention  Provide education and demonstration on how to check pulse in carotid and radial arteries.;Review the importance of being able to check your own pulse for safety during independent exercise       Expected Outcomes  Short Term: Able to explain why pulse  checking is important during independent exercise;Long Term: Able to check pulse independently and accurately       Understanding of Exercise Prescription  Yes       Intervention  Provide education, explanation, and written materials on patient's individual exercise prescription       Expected Outcomes  Long Term: Able to explain home exercise prescription to exercise independently;Short Term: Able to explain program exercise prescription          Exercise Goals Re-Evaluation :   Discharge Exercise Prescription (Final Exercise Prescription Changes): Exercise Prescription Changes - 06/10/19 1600      Response to Exercise   Blood Pressure (Admit)  112/60    Blood Pressure (Exercise)  144/64    Blood Pressure (Exit)  124/58    Heart Rate (Admit)  76 bpm    Heart Rate (Exercise)  86 bpm    Heart Rate (Exit)  66 bpm    Oxygen Saturation (Admit)  94 %    Oxygen Saturation (Exercise)  98 %    Rating of Perceived Exertion (Exercise)  11    Symptoms  wearing slides, shuffle gait, leaning on can and railing, weakness    Comments  walk test results       Nutrition:  Target Goals: Understanding of nutrition guidelines, daily intake of sodium '1500mg'$ , cholesterol '200mg'$ , calories 30% from fat and 7% or less from saturated fats, daily to have 5 or more servings of fruits and vegetables.  Biometrics: Pre Biometrics - 06/10/19 1637      Pre Biometrics   Height  6' 1.1" (1.857 m)    Weight  141 lb 3.2 oz (64 kg)    BMI (Calculated)  18.57        Nutrition Therapy Plan and Nutrition Goals:   Nutrition Assessments:   Nutrition Goals Re-Evaluation:   Nutrition Goals Discharge (Final Nutrition Goals Re-Evaluation):   Psychosocial: Target Goals: Acknowledge presence or absence of significant depression and/or stress, maximize coping skills, provide positive support system. Participant is able to verbalize types and ability to use techniques and skills needed for reducing stress and  depression.   Initial Review & Psychosocial Screening: Initial Psych Review & Screening - 06/07/19 1423  Initial Review   Current issues with  Current Stress Concerns    Source of Stress Concerns  Chronic Illness    Comments  Dementia      Family Dynamics   Good Support System?  Yes   wife handles most of his care     Barriers   Psychosocial barriers to participate in program  There are no identifiable barriers or psychosocial needs.;The patient should benefit from training in stress management and relaxation.      Screening Interventions   Interventions  Encouraged to exercise;To provide support and resources with identified psychosocial needs;Provide feedback about the scores to participant    Expected Outcomes  Short Term goal: Utilizing psychosocial counselor, staff and physician to assist with identification of specific Stressors or current issues interfering with healing process. Setting desired goal for each stressor or current issue identified.;Long Term Goal: Stressors or current issues are controlled or eliminated.;Short Term goal: Identification and review with participant of any Quality of Life or Depression concerns found by scoring the questionnaire.;Long Term goal: The participant improves quality of Life and PHQ9 Scores as seen by post scores and/or verbalization of changes       Quality of Life Scores:   Scores of 19 and below usually indicate a poorer quality of life in these areas.  A difference of  2-3 points is a clinically meaningful difference.  A difference of 2-3 points in the total score of the Quality of Life Index has been associated with significant improvement in overall quality of life, self-image, physical symptoms, and general health in studies assessing change in quality of life.  PHQ-9: Recent Review Flowsheet Data    There is no flowsheet data to display.     Interpretation of Total Score  Total Score Depression Severity:  1-4 = Minimal  depression, 5-9 = Mild depression, 10-14 = Moderate depression, 15-19 = Moderately severe depression, 20-27 = Severe depression   Psychosocial Evaluation and Intervention: Psychosocial Evaluation - 06/07/19 1423      Psychosocial Evaluation & Interventions   Interventions  Stress management education;Relaxation education;Encouraged to exercise with the program and follow exercise prescription    Comments  Fermon has cardiac issues that are being medically managed instead of placing an ICD or performing an ablation. He was diagnosed with dementia. His wife has to keep him on track daily. He enjoys watching TV, but knows he needs cardiac rehab to help him feel better. He reports sleeping well.    Expected Outcomes  Short: attend cardiac rehab for exercise and educaiton. Long: find positive self care habits       Psychosocial Re-Evaluation:   Psychosocial Discharge (Final Psychosocial Re-Evaluation):   Vocational Rehabilitation: Provide vocational rehab assistance to qualifying candidates.   Vocational Rehab Evaluation & Intervention: Vocational Rehab - 06/07/19 1405      Initial Vocational Rehab Evaluation & Intervention   Assessment shows need for Vocational Rehabilitation  No       Education: Education Goals: Education classes will be provided on a variety of topics geared toward better understanding of heart health and risk factor modification. Participant will state understanding/return demonstration of topics presented as noted by education test scores.  Learning Barriers/Preferences: Learning Barriers/Preferences - 06/07/19 1405      Learning Barriers/Preferences   Learning Barriers  Exercise Concerns   Memory impairment   Learning Preferences  None       Education Topics:  AED/CPR: - Group verbal and written instruction with the use of models to  demonstrate the basic use of the AED with the basic ABC's of resuscitation.   General Nutrition Guidelines/Fats and  Fiber: -Group instruction provided by verbal, written material, models and posters to present the general guidelines for heart healthy nutrition. Gives an explanation and review of dietary fats and fiber.   Controlling Sodium/Reading Food Labels: -Group verbal and written material supporting the discussion of sodium use in heart healthy nutrition. Review and explanation with models, verbal and written materials for utilization of the food label.   Exercise Physiology & General Exercise Guidelines: - Group verbal and written instruction with models to review the exercise physiology of the cardiovascular system and associated critical values. Provides general exercise guidelines with specific guidelines to those with heart or lung disease.    Aerobic Exercise & Resistance Training: - Gives group verbal and written instruction on the various components of exercise. Focuses on aerobic and resistive training programs and the benefits of this training and how to safely progress through these programs..   Flexibility, Balance, Mind/Body Relaxation: Provides group verbal/written instruction on the benefits of flexibility and balance training, including mind/body exercise modes such as yoga, pilates and tai chi.  Demonstration and skill practice provided.   Stress and Anxiety: - Provides group verbal and written instruction about the health risks of elevated stress and causes of high stress.  Discuss the correlation between heart/lung disease and anxiety and treatment options. Review healthy ways to manage with stress and anxiety.   Depression: - Provides group verbal and written instruction on the correlation between heart/lung disease and depressed mood, treatment options, and the stigmas associated with seeking treatment.   Anatomy & Physiology of the Heart: - Group verbal and written instruction and models provide basic cardiac anatomy and physiology, with the coronary electrical and arterial  systems. Review of Valvular disease and Heart Failure   Cardiac Procedures: - Group verbal and written instruction to review commonly prescribed medications for heart disease. Reviews the medication, class of the drug, and side effects. Includes the steps to properly store meds and maintain the prescription regimen. (beta blockers and nitrates)   Cardiac Medications I: - Group verbal and written instruction to review commonly prescribed medications for heart disease. Reviews the medication, class of the drug, and side effects. Includes the steps to properly store meds and maintain the prescription regimen.   Cardiac Medications II: -Group verbal and written instruction to review commonly prescribed medications for heart disease. Reviews the medication, class of the drug, and side effects. (all other drug classes)    Go Sex-Intimacy & Heart Disease, Get SMART - Goal Setting: - Group verbal and written instruction through game format to discuss heart disease and the return to sexual intimacy. Provides group verbal and written material to discuss and apply goal setting through the application of the S.M.A.R.T. Method.   Other Matters of the Heart: - Provides group verbal, written materials and models to describe Stable Angina and Peripheral Artery. Includes description of the disease process and treatment options available to the cardiac patient.   Exercise & Equipment Safety: - Individual verbal instruction and demonstration of equipment use and safety with use of the equipment.   Cardiac Rehab from 06/10/2019 in [facility] Cardiac and Pulmonary Rehab  Date  06/10/19  Educator  [facility]  Instruction Review Code  1- Verbalizes Understanding      Infection Prevention: - Provides verbal and written material to individual with discussion of infection control including proper hand washing and proper equipment cleaning during exercise  session.   Cardiac Rehab from 06/10/2019 in [facility] Cardiac and  Pulmonary Rehab  Date  06/10/19  Educator  [facility]  Instruction Review Code  1- Verbalizes Understanding      Falls Prevention: - Provides verbal and written material to individual with discussion of falls prevention and safety.   Cardiac Rehab from 06/10/2019 in [facility] Cardiac and Pulmonary Rehab  Date  06/10/19  Educator  [facility]  Instruction Review Code  1- Verbalizes Understanding      Diabetes: - Individual verbal and written instruction to review signs/symptoms of diabetes, desired ranges of glucose level fasting, after meals and with exercise. Acknowledge that pre and post exercise glucose checks will be done for 3 sessions at entry of program.   Cardiac Rehab from 06/10/2019 in [facility] Cardiac and Pulmonary Rehab  Date  06/07/19  Educator  [facility]  Instruction Review Code  1- Verbalizes Understanding      Know Your Numbers and Risk Factors: -Group verbal and written instruction about important numbers in your health.  Discussion of what are risk factors and how they play a role in the disease process.  Review of Cholesterol, Blood Pressure, Diabetes, and BMI and the role they play in your overall health.   Sleep Hygiene: -Provides group verbal and written instruction about how sleep can affect your health.  Define sleep hygiene, discuss sleep cycles and impact of sleep habits. Review good sleep hygiene tips.    Other: -Provides group and verbal instruction on various topics (see comments)   Knowledge Questionnaire Score:   Core Components/Risk Factors/Patient Goals at Admission: Personal Goals and Risk Factors at Admission - 06/10/19 1637      Core Components/Risk Factors/Patient Goals on Admission    Weight Management  Yes;Weight Gain    Intervention  Weight Management: Develop a combined nutrition and exercise program designed to reach desired caloric intake, while maintaining appropriate intake of nutrient and fiber, sodium and fats, and appropriate energy expenditure required for  the weight goal.;Weight Management: Provide education and appropriate resources to help participant work on and attain dietary goals.    Admit Weight  141 lb 3.2 oz (64 kg)    Goal Weight: Short Term  145 lb (65.8 kg)    Goal Weight: Long Term  150 lb (68 kg)    Expected Outcomes  Short Term: Continue to assess and modify interventions until short term weight is achieved;Long Term: Adherence to nutrition and physical activity/exercise program aimed toward attainment of established weight goal;Weight Gain: Understanding of general recommendations for a high calorie, high protein meal plan that promotes weight gain by distributing calorie intake throughout the day with the consumption for 4-5 meals, snacks, and/or supplements    Tobacco Cessation  Yes    Number of packs per day  1.5 packs    Intervention  Assist the participant in steps to quit. Provide individualized education and counseling about committing to Tobacco Cessation, relapse prevention, and pharmacological support that can be provided by physician.;Advice worker, assist with locating and accessing local/national Quit Smoking programs, and support quit date choice.    Expected Outcomes  Short Term: Will demonstrate readiness to quit, by selecting a quit date.;Short Term: Will quit all tobacco product use, adhering to prevention of relapse plan.;Long Term: Complete abstinence from all tobacco products for at least 12 months from quit date.    Diabetes  Yes    Intervention  Provide education about signs/symptoms and action to take for hypo/hyperglycemia.;Provide education about proper nutrition, including  hydration, and aerobic/resistive exercise prescription along with prescribed medications to achieve blood glucose in normal ranges: Fasting glucose 65-99 mg/dL    Expected Outcomes  Short Term: Participant verbalizes understanding of the signs/symptoms and immediate care of hyper/hypoglycemia, proper foot care and importance of  medication, aerobic/resistive exercise and nutrition plan for blood glucose control.;Long Term: Attainment of HbA1C < 7%.    Heart Failure  Yes    Intervention  Provide a combined exercise and nutrition program that is supplemented with education, support and counseling about heart failure. Directed toward relieving symptoms such as shortness of breath, decreased exercise tolerance, and extremity edema.    Expected Outcomes  Improve functional capacity of life;Short term: Attendance in program 2-3 days a week with increased exercise capacity. Reported lower sodium intake. Reported increased fruit and vegetable intake. Reports medication compliance.;Short term: Daily weights obtained and reported for increase. Utilizing diuretic protocols set by physician.;Long term: Adoption of self-care skills and reduction of barriers for early signs and symptoms recognition and intervention leading to self-care maintenance.    Hypertension  Yes    Intervention  Provide education on lifestyle modifcations including regular physical activity/exercise, weight management, moderate sodium restriction and increased consumption of fresh fruit, vegetables, and low fat dairy, alcohol moderation, and smoking cessation.;Monitor prescription use compliance.    Expected Outcomes  Short Term: Continued assessment and intervention until BP is < 140/32m HG in hypertensive participants. < 130/849mHG in hypertensive participants with diabetes, heart failure or chronic kidney disease.;Long Term: Maintenance of blood pressure at goal levels.    Lipids  Yes    Intervention  Provide education and support for participant on nutrition & aerobic/resistive exercise along with prescribed medications to achieve LDL '70mg'$ , HDL >'40mg'$ .    Expected Outcomes  Short Term: Participant states understanding of desired cholesterol values and is compliant with medications prescribed. Participant is following exercise prescription and nutrition  guidelines.;Long Term: Cholesterol controlled with medications as prescribed, with individualized exercise RX and with personalized nutrition plan. Value goals: LDL < '70mg'$ , HDL > 40 mg.       Core Components/Risk Factors/Patient Goals Review:    Core Components/Risk Factors/Patient Goals at Discharge (Final Review):    ITP Comments: ITP Comments    Row Name 06/07/19 1420 06/10/19 1632         ITP Comments  Initial phone orientation completed. [facility] scheduled for 2/15 at 3pm  Completed 6MWT and gym orientation.  Initial ITP created and sent for review to Dr. MaEmily FilbertMedical Director.         Comments: Initial ITP

## 2019-06-13 ENCOUNTER — Ambulatory Visit: Payer: Medicare PPO

## 2019-06-17 ENCOUNTER — Ambulatory Visit: Payer: Medicare PPO

## 2019-06-19 ENCOUNTER — Encounter: Payer: Self-pay | Admitting: *Deleted

## 2019-06-19 DIAGNOSIS — I214 Non-ST elevation (NSTEMI) myocardial infarction: Secondary | ICD-10-CM

## 2019-06-19 NOTE — Progress Notes (Signed)
Cardiac Individual Treatment Plan  Patient Details  Name: Kurt [name] MRN: [id number] Date of Birth: Feb 18, 1941 Referring Provider:     Cardiac Rehab from 06/10/2019 in [facility] Cardiac and Pulmonary Rehab  Referring Provider  Lujean Amel MD      Initial Encounter Date:    Cardiac Rehab from 06/10/2019 in [facility] Cardiac and Pulmonary Rehab  Date  06/10/19      Visit Diagnosis: NSTEMI (non-ST elevated myocardial infarction) [facility])  Patient's Home Medications on Admission:  Current Outpatient Medications:  .  amiodarone (PACERONE) 200 MG tablet, Take 1 tablet (200 mg total) by mouth 2 (two) times daily., Disp: 60 tablet, Rfl: 0 .  amiodarone (PACERONE) 200 MG tablet, Take 200 mg by mouth daily. , Disp: , Rfl:  .  apixaban (ELIQUIS) 5 MG TABS tablet, Take 1 tablet (5 mg total) by mouth 2 (two) times daily., Disp: 60 tablet, Rfl: 0 .  budesonide-formoterol (SYMBICORT) 80-4.5 MCG/ACT inhaler, INHALE 2 PUFFS BY MOUTH TWICE A DAY (RINSE MOUTH WELL WITH WATER AFTER EACH USE), Disp: , Rfl:  .  Cholecalciferol (D-5000) 5000 units TABS, Take 5,000 Units by mouth daily. , Disp: , Rfl:  .  clopidogrel (PLAVIX) 75 MG tablet, Take 1 tablet (75 mg total) by mouth daily., Disp: 30 tablet, Rfl: 0 .  co-enzyme Q-10 30 MG capsule, Take 30 mg by mouth 3 (three) times daily., Disp: , Rfl:  .  docusate sodium (COLACE) 100 MG capsule, Take 100 mg by mouth 2 (two) times daily., Disp: , Rfl:  .  donepezil (ARICEPT) 5 MG tablet, Take 1 tablet (5 mg total) by mouth at bedtime., Disp: 30 tablet, Rfl: 0 .  furosemide (LASIX) 20 MG tablet, Take 1 tablet (20 mg total) by mouth daily., Disp: 30 tablet, Rfl: 0 .  furosemide (LASIX) 20 MG tablet, Take 20 mg by mouth daily. , Disp: , Rfl:  .  Garlic Oil 8250 MG CAPS, Take 1,000 mg by mouth daily as needed (cholesterol). , Disp: , Rfl:  .  glimepiride (AMARYL) 2 MG tablet, Take 2 mg by mouth 2 (two) times daily with a meal., Disp: , Rfl:  .  glucose blood (ACCU-CHEK  AVIVA PLUS) test strip, USE TO CHECK GLUCOSE TWICE DAILY, Disp: , Rfl:  .  lisinopril (ZESTRIL) 5 MG tablet, Take 1 tablet (5 mg total) by mouth daily., Disp: 30 tablet, Rfl: 0 .  metFORMIN (GLUCOPHAGE) 500 MG tablet, Take 1,000 mg by mouth daily with breakfast. , Disp: , Rfl:  .  Misc Natural Products (PROSTATE THERAPY COMPLEX PO), Take 562 mg by mouth daily. , Disp: , Rfl:  .  ondansetron (ZOFRAN) 4 MG tablet, Take by mouth., Disp: , Rfl:  .  Red Yeast Rice 600 MG CAPS, Take 1 capsule by mouth daily. , Disp: , Rfl:  .  senna (SENOKOT) 8.6 MG tablet, TAKE ONE TABLET BY MOUTH TWICE A DAY AS NEEDED, Disp: , Rfl:  .  spironolactone (ALDACTONE) 25 MG tablet, Take 0.5 tablets (12.5 mg total) by mouth daily., Disp: 15 tablet, Rfl: 0 .  spironolactone (ALDACTONE) 25 MG tablet, Take by mouth., Disp: , Rfl:  .  tiotropium (SPIRIVA) 18 MCG inhalation capsule, INHALE 1 CAPSULE IN INHALER BY MOUTH DAILY, Disp: , Rfl:  .  vitamin B-12 (CYANOCOBALAMIN) 1000 MCG tablet, Take 1,000 mcg by mouth daily. , Disp: , Rfl:   Past Medical History: Past Medical History:  Diagnosis Date  . AAA (abdominal aortic aneurysm) ([place])   . Diabetes mellitus  without complication (HCC)     Tobacco Use: Social History   Tobacco Use  Smoking Status Heavy Tobacco Smoker  Smokeless Tobacco Current User    Labs: Recent Review Flowsheet Data    Labs for ITP Cardiac and Pulmonary Rehab Latest Ref Rng & Units 03/01/2018 04/06/2019   Cholestrol 0 - 200 mg/dL 168 154   LDLCALC 0 - 99 mg/dL 110(H) 101(H)   HDL >40 mg/dL 31(L) 31(L)   Trlycerides <150 mg/dL 137 109   Hemoglobin A1c 4.8 - 5.6 % 6.6(H) 7.8(H)       Exercise Target Goals: Exercise Program Goal: Individual exercise prescription set using results from initial 6 min walk test and THRR while considering  patient's activity barriers and safety.   Exercise Prescription Goal: Initial exercise prescription builds to 30-45 minutes a day of aerobic activity, 2-3 days  per week.  Home exercise guidelines will be given to patient during program as part of exercise prescription that the participant will acknowledge.  Activity Barriers & Risk Stratification: Activity Barriers & Cardiac Risk Stratification - 06/10/19 1634      Activity Barriers & Cardiac Risk Stratification   Activity Barriers  Balance Concerns;Muscular Weakness;Deconditioning;Shortness of Breath;History of Falls;Assistive Device;Decreased Ventricular Function    Cardiac Risk Stratification  High       6 Minute Walk: 6 Minute Walk    Row Name 06/10/19 1632         6 Minute Walk   Phase  Initial     Distance  180 feet     Walk Time  6 minutes     # of Rest Breaks  0     MPH  0.34     METS  1.22     RPE  11     VO2 Peak  4.27     Symptoms  Yes (comment)     Comments  shuffle gait, leaning on railing and cane, wearing slides     Resting HR  76 bpm     Resting BP  112/60     Resting Oxygen Saturation   97 %     Exercise Oxygen Saturation  during 6 min walk  98 %     Max Ex. HR  86 bpm     Max Ex. BP  144/64     2 Minute Post BP  124/58        Oxygen Initial Assessment:   Oxygen Re-Evaluation:   Oxygen Discharge (Final Oxygen Re-Evaluation):   Initial Exercise Prescription: Initial Exercise Prescription - 06/10/19 1600      Date of Initial Exercise RX and Referring Provider   Date  06/10/19    Referring Provider  Lujean Amel MD      Recumbant Bike   Level  1    RPM  50    Watts  10    Minutes  15    METs  1.2      NuStep   Level  1    SPM  80    Minutes  15    METs  1.2      Arm Ergometer   Level  1    Watts  10    RPM  25    Minutes  15    METs  1.2      Biostep-RELP   Level  1    SPM  50    Minutes  15    METs  1      Prescription  Details   Frequency (times per week)  3    Duration  Progress to 30 minutes of continuous aerobic without signs/symptoms of physical distress      Intensity   THRR 40-80% of Max Heartrate  102-130     Ratings of Perceived Exertion  11-13    Perceived Dyspnea  0-4      Progression   Progression  Continue to progress workloads to maintain intensity without signs/symptoms of physical distress.      Resistance Training   Training Prescription  Yes    Weight  3 lb    Reps  10-15       Perform Capillary Blood Glucose checks as needed.  Exercise Prescription Changes: Exercise Prescription Changes    Row Name 06/10/19 1600             Response to Exercise   Blood Pressure (Admit)  112/60       Blood Pressure (Exercise)  144/64       Blood Pressure (Exit)  124/58       Heart Rate (Admit)  76 bpm       Heart Rate (Exercise)  86 bpm       Heart Rate (Exit)  66 bpm       Oxygen Saturation (Admit)  94 %       Oxygen Saturation (Exercise)  98 %       Rating of Perceived Exertion (Exercise)  11       Symptoms  wearing slides, shuffle gait, leaning on can and railing, weakness       Comments  walk test results          Exercise Comments:   Exercise Goals and Review: Exercise Goals    Row Name 06/10/19 1636             Exercise Goals   Increase Physical Activity  Yes       Intervention  Provide advice, education, support and counseling about physical activity/exercise needs.;Develop an individualized exercise prescription for aerobic and resistive training based on initial evaluation findings, risk stratification, comorbidities and participant's personal goals.       Expected Outcomes  Long Term: Add in home exercise to make exercise part of routine and to increase amount of physical activity.;Short Term: Attend rehab on a regular basis to increase amount of physical activity.;Long Term: Exercising regularly at least 3-5 days a week.       Increase Strength and Stamina  Yes       Intervention  Provide advice, education, support and counseling about physical activity/exercise needs.;Develop an individualized exercise prescription for aerobic and resistive training based on  initial evaluation findings, risk stratification, comorbidities and participant's personal goals.       Expected Outcomes  Short Term: Increase workloads from initial exercise prescription for resistance, speed, and METs.;Short Term: Perform resistance training exercises routinely during rehab and add in resistance training at home;Long Term: Improve cardiorespiratory fitness, muscular endurance and strength as measured by increased METs and functional capacity (6MWT)       Able to understand and use rate of perceived exertion (RPE) scale  Yes       Intervention  Provide education and explanation on how to use RPE scale       Expected Outcomes  Short Term: Able to use RPE daily in rehab to express subjective intensity level;Long Term:  Able to use RPE to guide intensity level when exercising independently  Able to understand and use Dyspnea scale  Yes       Intervention  Provide education and explanation on how to use Dyspnea scale       Expected Outcomes  Short Term: Able to use Dyspnea scale daily in rehab to express subjective sense of shortness of breath during exertion;Long Term: Able to use Dyspnea scale to guide intensity level when exercising independently       Knowledge and understanding of Target Heart Rate Range (THRR)  Yes       Intervention  Provide education and explanation of THRR including how the numbers were predicted and where they are located for reference       Expected Outcomes  Short Term: Able to state/look up THRR;Short Term: Able to use daily as guideline for intensity in rehab;Long Term: Able to use THRR to govern intensity when exercising independently       Able to check pulse independently  Yes       Intervention  Provide education and demonstration on how to check pulse in carotid and radial arteries.;Review the importance of being able to check your own pulse for safety during independent exercise       Expected Outcomes  Short Term: Able to explain why pulse  checking is important during independent exercise;Long Term: Able to check pulse independently and accurately       Understanding of Exercise Prescription  Yes       Intervention  Provide education, explanation, and written materials on patient's individual exercise prescription       Expected Outcomes  Long Term: Able to explain home exercise prescription to exercise independently;Short Term: Able to explain program exercise prescription          Exercise Goals Re-Evaluation :   Discharge Exercise Prescription (Final Exercise Prescription Changes): Exercise Prescription Changes - 06/10/19 1600      Response to Exercise   Blood Pressure (Admit)  112/60    Blood Pressure (Exercise)  144/64    Blood Pressure (Exit)  124/58    Heart Rate (Admit)  76 bpm    Heart Rate (Exercise)  86 bpm    Heart Rate (Exit)  66 bpm    Oxygen Saturation (Admit)  94 %    Oxygen Saturation (Exercise)  98 %    Rating of Perceived Exertion (Exercise)  11    Symptoms  wearing slides, shuffle gait, leaning on can and railing, weakness    Comments  walk test results       Nutrition:  Target Goals: Understanding of nutrition guidelines, daily intake of sodium <1550m, cholesterol <2086m calories 30% from fat and 7% or less from saturated fats, daily to have 5 or more servings of fruits and vegetables.  Biometrics: Pre Biometrics - 06/10/19 1637      Pre Biometrics   Height  6' 1.1" (1.857 m)    Weight  141 lb 3.2 oz (64 kg)    BMI (Calculated)  18.57        Nutrition Therapy Plan and Nutrition Goals:   Nutrition Assessments: Nutrition Assessments - 06/11/19 0827      MEDFICTS Scores   Pre Score  43       Nutrition Goals Re-Evaluation:   Nutrition Goals Discharge (Final Nutrition Goals Re-Evaluation):   Psychosocial: Target Goals: Acknowledge presence or absence of significant depression and/or stress, maximize coping skills, provide positive support system. Participant is able to  verbalize types and ability to use techniques and skills needed for  reducing stress and depression.   Initial Review & Psychosocial Screening: Initial Psych Review & Screening - 06/07/19 1423      Initial Review   Current issues with  Current Stress Concerns    Source of Stress Concerns  Chronic Illness    Comments  Dementia      Family Dynamics   Good Support System?  Yes   wife handles most of his care     Barriers   Psychosocial barriers to participate in program  There are no identifiable barriers or psychosocial needs.;The patient should benefit from training in stress management and relaxation.      Screening Interventions   Interventions  Encouraged to exercise;To provide support and resources with identified psychosocial needs;Provide feedback about the scores to participant    Expected Outcomes  Short Term goal: Utilizing psychosocial counselor, staff and physician to assist with identification of specific Stressors or current issues interfering with healing process. Setting desired goal for each stressor or current issue identified.;Long Term Goal: Stressors or current issues are controlled or eliminated.;Short Term goal: Identification and review with participant of any Quality of Life or Depression concerns found by scoring the questionnaire.;Long Term goal: The participant improves quality of Life and PHQ9 Scores as seen by post scores and/or verbalization of changes       Quality of Life Scores:  Quality of Life - 06/11/19 0828      Quality of Life   Select  Quality of Life      Quality of Life Scores   Health/Function Pre  20.83 %    Socioeconomic Pre  23.79 %    Psych/Spiritual Pre  27.71 %    Family Pre  26.25 %    GLOBAL Pre  23.58 %      Scores of 19 and below usually indicate a poorer quality of life in these areas.  A difference of  2-3 points is a clinically meaningful difference.  A difference of 2-3 points in the total score of the Quality of Life Index  has been associated with significant improvement in overall quality of life, self-image, physical symptoms, and general health in studies assessing change in quality of life.  PHQ-9: Recent Review Flowsheet Data    Depression screen [facility] 2/9 06/11/2019   Decreased Interest 3   Down, Depressed, Hopeless 2   PHQ - 2 Score 5   Altered sleeping 2   Tired, decreased energy 1   Change in appetite 0   Feeling bad or failure about yourself  3   Trouble concentrating 0   Moving slowly or fidgety/restless 0   Suicidal thoughts 0   PHQ-9 Score 11   Difficult doing work/chores Somewhat difficult     Interpretation of Total Score  Total Score Depression Severity:  1-4 = Minimal depression, 5-9 = Mild depression, 10-14 = Moderate depression, 15-19 = Moderately severe depression, 20-27 = Severe depression   Psychosocial Evaluation and Intervention: Psychosocial Evaluation - 06/07/19 1423      Psychosocial Evaluation & Interventions   Interventions  Stress management education;Relaxation education;Encouraged to exercise with the program and follow exercise prescription    Comments  Cullen has cardiac issues that are being medically managed instead of placing an ICD or performing an ablation. He was diagnosed with dementia. His wife has to keep him on track daily. He enjoys watching TV, but knows he needs cardiac rehab to help him feel better. He reports sleeping well.    Expected Outcomes  Short: attend  cardiac rehab for exercise and educaiton. Long: find positive self care habits       Psychosocial Re-Evaluation:   Psychosocial Discharge (Final Psychosocial Re-Evaluation):   Vocational Rehabilitation: Provide vocational rehab assistance to qualifying candidates.   Vocational Rehab Evaluation & Intervention: Vocational Rehab - 06/07/19 1405      Initial Vocational Rehab Evaluation & Intervention   Assessment shows need for Vocational Rehabilitation  No       Education: Education  Goals: Education classes will be provided on a variety of topics geared toward better understanding of heart health and risk factor modification. Participant will state understanding/return demonstration of topics presented as noted by education test scores.  Learning Barriers/Preferences: Learning Barriers/Preferences - 06/07/19 1405      Learning Barriers/Preferences   Learning Barriers  Exercise Concerns   Memory impairment   Learning Preferences  None       Education Topics:  AED/CPR: - Group verbal and written instruction with the use of models to demonstrate the basic use of the AED with the basic ABC's of resuscitation.   General Nutrition Guidelines/Fats and Fiber: -Group instruction provided by verbal, written material, models and posters to present the general guidelines for heart healthy nutrition. Gives an explanation and review of dietary fats and fiber.   Controlling Sodium/Reading Food Labels: -Group verbal and written material supporting the discussion of sodium use in heart healthy nutrition. Review and explanation with models, verbal and written materials for utilization of the food label.   Exercise Physiology & General Exercise Guidelines: - Group verbal and written instruction with models to review the exercise physiology of the cardiovascular system and associated critical values. Provides general exercise guidelines with specific guidelines to those with heart or lung disease.    Aerobic Exercise & Resistance Training: - Gives group verbal and written instruction on the various components of exercise. Focuses on aerobic and resistive training programs and the benefits of this training and how to safely progress through these programs..   Flexibility, Balance, Mind/Body Relaxation: Provides group verbal/written instruction on the benefits of flexibility and balance training, including mind/body exercise modes such as yoga, pilates and tai chi.  Demonstration  and skill practice provided.   Stress and Anxiety: - Provides group verbal and written instruction about the health risks of elevated stress and causes of high stress.  Discuss the correlation between heart/lung disease and anxiety and treatment options. Review healthy ways to manage with stress and anxiety.   Depression: - Provides group verbal and written instruction on the correlation between heart/lung disease and depressed mood, treatment options, and the stigmas associated with seeking treatment.   Anatomy & Physiology of the Heart: - Group verbal and written instruction and models provide basic cardiac anatomy and physiology, with the coronary electrical and arterial systems. Review of Valvular disease and Heart Failure   Cardiac Procedures: - Group verbal and written instruction to review commonly prescribed medications for heart disease. Reviews the medication, class of the drug, and side effects. Includes the steps to properly store meds and maintain the prescription regimen. (beta blockers and nitrates)   Cardiac Medications I: - Group verbal and written instruction to review commonly prescribed medications for heart disease. Reviews the medication, class of the drug, and side effects. Includes the steps to properly store meds and maintain the prescription regimen.   Cardiac Medications II: -Group verbal and written instruction to review commonly prescribed medications for heart disease. Reviews the medication, class of the drug, and side effects. (all other  drug classes)    Go Sex-Intimacy & Heart Disease, Get SMART - Goal Setting: - Group verbal and written instruction through game format to discuss heart disease and the return to sexual intimacy. Provides group verbal and written material to discuss and apply goal setting through the application of the S.M.A.R.T. Method.   Other Matters of the Heart: - Provides group verbal, written materials and models to describe  Stable Angina and Peripheral Artery. Includes description of the disease process and treatment options available to the cardiac patient.   Exercise & Equipment Safety: - Individual verbal instruction and demonstration of equipment use and safety with use of the equipment.   Cardiac Rehab from 06/10/2019 in [facility] Cardiac and Pulmonary Rehab  Date  06/10/19  Educator  [facility]  Instruction Review Code  1- Verbalizes Understanding      Infection Prevention: - Provides verbal and written material to individual with discussion of infection control including proper hand washing and proper equipment cleaning during exercise session.   Cardiac Rehab from 06/10/2019 in [facility] Cardiac and Pulmonary Rehab  Date  06/10/19  Educator  [facility]  Instruction Review Code  1- Verbalizes Understanding      Falls Prevention: - Provides verbal and written material to individual with discussion of falls prevention and safety.   Cardiac Rehab from 06/10/2019 in [facility] Cardiac and Pulmonary Rehab  Date  06/10/19  Educator  [facility]  Instruction Review Code  1- Verbalizes Understanding      Diabetes: - Individual verbal and written instruction to review signs/symptoms of diabetes, desired ranges of glucose level fasting, after meals and with exercise. Acknowledge that pre and post exercise glucose checks will be done for 3 sessions at entry of program.   Cardiac Rehab from 06/10/2019 in [facility] Cardiac and Pulmonary Rehab  Date  06/07/19  Educator  [facility]  Instruction Review Code  1- Verbalizes Understanding      Know Your Numbers and Risk Factors: -Group verbal and written instruction about important numbers in your health.  Discussion of what are risk factors and how they play a role in the disease process.  Review of Cholesterol, Blood Pressure, Diabetes, and BMI and the role they play in your overall health.   Sleep Hygiene: -Provides group verbal and written instruction about how sleep can affect your health.  Define sleep  hygiene, discuss sleep cycles and impact of sleep habits. Review good sleep hygiene tips.    Other: -Provides group and verbal instruction on various topics (see comments)   Knowledge Questionnaire Score: Knowledge Questionnaire Score - 06/11/19 0827      Knowledge Questionnaire Score   Pre Score  19/26 Education Focus; A&P, Nutrition, Exercise,       Core Components/Risk Factors/Patient Goals at Admission: Personal Goals and Risk Factors at Admission - 06/10/19 1637      Core Components/Risk Factors/Patient Goals on Admission    Weight Management  Yes;Weight Gain    Intervention  Weight Management: Develop a combined nutrition and exercise program designed to reach desired caloric intake, while maintaining appropriate intake of nutrient and fiber, sodium and fats, and appropriate energy expenditure required for the weight goal.;Weight Management: Provide education and appropriate resources to help participant work on and attain dietary goals.    Admit Weight  141 lb 3.2 oz (64 kg)    Goal Weight: Short Term  145 lb (65.8 kg)    Goal Weight: Long Term  150 lb (68 kg)    Expected Outcomes  Short Term: Continue  to assess and modify interventions until short term weight is achieved;Long Term: Adherence to nutrition and physical activity/exercise program aimed toward attainment of established weight goal;Weight Gain: Understanding of general recommendations for a high calorie, high protein meal plan that promotes weight gain by distributing calorie intake throughout the day with the consumption for 4-5 meals, snacks, and/or supplements    Tobacco Cessation  Yes    Number of packs per day  1.5 packs    Intervention  Assist the participant in steps to quit. Provide individualized education and counseling about committing to Tobacco Cessation, relapse prevention, and pharmacological support that can be provided by physician.;Advice worker, assist with locating and accessing  local/national Quit Smoking programs, and support quit date choice.    Expected Outcomes  Short Term: Will demonstrate readiness to quit, by selecting a quit date.;Short Term: Will quit all tobacco product use, adhering to prevention of relapse plan.;Long Term: Complete abstinence from all tobacco products for at least 12 months from quit date.    Diabetes  Yes    Intervention  Provide education about signs/symptoms and action to take for hypo/hyperglycemia.;Provide education about proper nutrition, including hydration, and aerobic/resistive exercise prescription along with prescribed medications to achieve blood glucose in normal ranges: Fasting glucose 65-99 mg/dL    Expected Outcomes  Short Term: Participant verbalizes understanding of the signs/symptoms and immediate care of hyper/hypoglycemia, proper foot care and importance of medication, aerobic/resistive exercise and nutrition plan for blood glucose control.;Long Term: Attainment of HbA1C < 7%.    Heart Failure  Yes    Intervention  Provide a combined exercise and nutrition program that is supplemented with education, support and counseling about heart failure. Directed toward relieving symptoms such as shortness of breath, decreased exercise tolerance, and extremity edema.    Expected Outcomes  Improve functional capacity of life;Short term: Attendance in program 2-3 days a week with increased exercise capacity. Reported lower sodium intake. Reported increased fruit and vegetable intake. Reports medication compliance.;Short term: Daily weights obtained and reported for increase. Utilizing diuretic protocols set by physician.;Long term: Adoption of self-care skills and reduction of barriers for early signs and symptoms recognition and intervention leading to self-care maintenance.    Hypertension  Yes    Intervention  Provide education on lifestyle modifcations including regular physical activity/exercise, weight management, moderate sodium  restriction and increased consumption of fresh fruit, vegetables, and low fat dairy, alcohol moderation, and smoking cessation.;Monitor prescription use compliance.    Expected Outcomes  Short Term: Continued assessment and intervention until BP is < 140/21m HG in hypertensive participants. < 130/862mHG in hypertensive participants with diabetes, heart failure or chronic kidney disease.;Long Term: Maintenance of blood pressure at goal levels.    Lipids  Yes    Intervention  Provide education and support for participant on nutrition & aerobic/resistive exercise along with prescribed medications to achieve LDL '70mg'$ , HDL >'40mg'$ .    Expected Outcomes  Short Term: Participant states understanding of desired cholesterol values and is compliant with medications prescribed. Participant is following exercise prescription and nutrition guidelines.;Long Term: Cholesterol controlled with medications as prescribed, with individualized exercise RX and with personalized nutrition plan. Value goals: LDL < '70mg'$ , HDL > 40 mg.       Core Components/Risk Factors/Patient Goals Review:    Core Components/Risk Factors/Patient Goals at Discharge (Final Review):    ITP Comments: ITP Comments    Row Name 06/07/19 1420 06/10/19 1632 06/19/19 0611       ITP Comments  Initial  phone orientation completed. Diagnosis can be found in [facility] 12/[facility]. EP orientation scheduled for 2/15 at 3pm  Completed 6MWT and gym orientation.  Initial ITP created and sent for review to Dr. Emily Filbert, Medical Director.  30 day chart review completed. ITP sent to Dr Zachery Dakins Medical Director, for review,changes as needed and signature. New to program        Comments:

## 2019-06-20 ENCOUNTER — Encounter: Payer: Self-pay | Admitting: *Deleted

## 2019-06-20 ENCOUNTER — Telehealth: Payer: Self-pay | Admitting: *Deleted

## 2019-06-20 ENCOUNTER — Ambulatory Visit: Payer: Medicare PPO

## 2019-06-20 DIAGNOSIS — I214 Non-ST elevation (NSTEMI) myocardial infarction: Secondary | ICD-10-CM

## 2019-06-20 NOTE — Telephone Encounter (Signed)
Davon has had three falls over the weekend and did not follow up with his doctor.  He was not able to walk very far on his and holding on to wall.  We talked with his physician's office and feel that a PT referral for balance would better for him at this time prior to rehab.  We will discharge him at this time.

## 2019-06-20 NOTE — Progress Notes (Signed)
Cardiac Individual Treatment Plan  Patient Details  Name: Kurt [name] MRN: [id number] Date of Birth: Feb 18, 1941 Referring Provider:     Cardiac Rehab from 06/10/2019 in [facility] Cardiac and Pulmonary Rehab  Referring Provider  Lujean Amel MD      Initial Encounter Date:    Cardiac Rehab from 06/10/2019 in [facility] Cardiac and Pulmonary Rehab  Date  06/10/19      Visit Diagnosis: NSTEMI (non-ST elevated myocardial infarction) [facility] [place])  Patient's Home Medications on Admission:  Current Outpatient Medications:  .  amiodarone (PACERONE) 200 MG tablet, Take 1 tablet (200 mg total) by mouth 2 (two) times daily., Disp: 60 tablet, Rfl: 0 .  amiodarone (PACERONE) 200 MG tablet, Take 200 mg by mouth daily. , Disp: , Rfl:  .  apixaban (ELIQUIS) 5 MG TABS tablet, Take 1 tablet (5 mg total) by mouth 2 (two) times daily., Disp: 60 tablet, Rfl: 0 .  budesonide-formoterol (SYMBICORT) 80-4.5 MCG/ACT inhaler, INHALE 2 PUFFS BY MOUTH TWICE A DAY (RINSE MOUTH WELL WITH WATER AFTER EACH USE), Disp: , Rfl:  .  Cholecalciferol (D-5000) 5000 units TABS, Take 5,000 Units by mouth daily. , Disp: , Rfl:  .  clopidogrel (PLAVIX) 75 MG tablet, Take 1 tablet (75 mg total) by mouth daily., Disp: 30 tablet, Rfl: 0 .  co-enzyme Q-10 30 MG capsule, Take 30 mg by mouth 3 (three) times daily., Disp: , Rfl:  .  docusate sodium (COLACE) 100 MG capsule, Take 100 mg by mouth 2 (two) times daily., Disp: , Rfl:  .  donepezil (ARICEPT) 5 MG tablet, Take 1 tablet (5 mg total) by mouth at bedtime., Disp: 30 tablet, Rfl: 0 .  furosemide (LASIX) 20 MG tablet, Take 1 tablet (20 mg total) by mouth daily., Disp: 30 tablet, Rfl: 0 .  furosemide (LASIX) 20 MG tablet, Take 20 mg by mouth daily. , Disp: , Rfl:  .  Garlic Oil 8250 MG CAPS, Take 1,000 mg by mouth daily as needed (cholesterol). , Disp: , Rfl:  .  glimepiride (AMARYL) 2 MG tablet, Take 2 mg by mouth 2 (two) times daily with a meal., Disp: , Rfl:  .  glucose blood (ACCU-CHEK  AVIVA PLUS) test strip, USE TO CHECK GLUCOSE TWICE DAILY, Disp: , Rfl:  .  lisinopril (ZESTRIL) 5 MG tablet, Take 1 tablet (5 mg total) by mouth daily., Disp: 30 tablet, Rfl: 0 .  metFORMIN (GLUCOPHAGE) 500 MG tablet, Take 1,000 mg by mouth daily with breakfast. , Disp: , Rfl:  .  Misc Natural Products (PROSTATE THERAPY COMPLEX PO), Take 562 mg by mouth daily. , Disp: , Rfl:  .  ondansetron (ZOFRAN) 4 MG tablet, Take by mouth., Disp: , Rfl:  .  Red Yeast Rice 600 MG CAPS, Take 1 capsule by mouth daily. , Disp: , Rfl:  .  senna (SENOKOT) 8.6 MG tablet, TAKE ONE TABLET BY MOUTH TWICE A DAY AS NEEDED, Disp: , Rfl:  .  spironolactone (ALDACTONE) 25 MG tablet, Take 0.5 tablets (12.5 mg total) by mouth daily., Disp: 15 tablet, Rfl: 0 .  spironolactone (ALDACTONE) 25 MG tablet, Take by mouth., Disp: , Rfl:  .  tiotropium (SPIRIVA) 18 MCG inhalation capsule, INHALE 1 CAPSULE IN INHALER BY MOUTH DAILY, Disp: , Rfl:  .  vitamin B-12 (CYANOCOBALAMIN) 1000 MCG tablet, Take 1,000 mcg by mouth daily. , Disp: , Rfl:   Past Medical History: Past Medical History:  Diagnosis Date  . AAA (abdominal aortic aneurysm) ([place])   . Diabetes mellitus  without complication (HCC)     Tobacco Use: Social History   Tobacco Use  Smoking Status Heavy Tobacco Smoker  Smokeless Tobacco Current User    Labs: Recent Review Flowsheet Data    Labs for ITP Cardiac and Pulmonary Rehab Latest Ref Rng & Units 03/01/2018 04/06/2019   Cholestrol 0 - 200 mg/dL 168 154   LDLCALC 0 - 99 mg/dL 110(H) 101(H)   HDL >40 mg/dL 31(L) 31(L)   Trlycerides <150 mg/dL 137 109   Hemoglobin A1c 4.8 - 5.6 % 6.6(H) 7.8(H)       Exercise Target Goals: Exercise Program Goal: Individual exercise prescription set using results from initial 6 min walk test and THRR while considering  patient's activity barriers and safety.   Exercise Prescription Goal: Initial exercise prescription builds to 30-45 minutes a day of aerobic activity, 2-3 days  per week.  Home exercise guidelines will be given to patient during program as part of exercise prescription that the participant will acknowledge.  Activity Barriers & Risk Stratification: Activity Barriers & Cardiac Risk Stratification - 06/10/19 1634      Activity Barriers & Cardiac Risk Stratification   Activity Barriers  Balance Concerns;Muscular Weakness;Deconditioning;Shortness of Breath;History of Falls;Assistive Device;Decreased Ventricular Function    Cardiac Risk Stratification  High       6 Minute Walk: 6 Minute Walk    Row Name 06/10/19 1632         6 Minute Walk   Phase  Initial     Distance  180 feet     Walk Time  6 minutes     # of Rest Breaks  0     MPH  0.34     METS  1.22     RPE  11     VO2 Peak  4.27     Symptoms  Yes (comment)     Comments  shuffle gait, leaning on railing and cane, wearing slides     Resting HR  76 bpm     Resting BP  112/60     Resting Oxygen Saturation   97 %     Exercise Oxygen Saturation  during 6 min walk  98 %     Max Ex. HR  86 bpm     Max Ex. BP  144/64     2 Minute Post BP  124/58        Oxygen Initial Assessment:   Oxygen Re-Evaluation:   Oxygen Discharge (Final Oxygen Re-Evaluation):   Initial Exercise Prescription: Initial Exercise Prescription - 06/10/19 1600      Date of Initial Exercise RX and Referring Provider   Date  06/10/19    Referring Provider  Lujean Amel MD      Recumbant Bike   Level  1    RPM  50    Watts  10    Minutes  15    METs  1.2      NuStep   Level  1    SPM  80    Minutes  15    METs  1.2      Arm Ergometer   Level  1    Watts  10    RPM  25    Minutes  15    METs  1.2      Biostep-RELP   Level  1    SPM  50    Minutes  15    METs  1      Prescription  Details   Frequency (times per week)  3    Duration  Progress to 30 minutes of continuous aerobic without signs/symptoms of physical distress      Intensity   THRR 40-80% of Max Heartrate  102-130     Ratings of Perceived Exertion  11-13    Perceived Dyspnea  0-4      Progression   Progression  Continue to progress workloads to maintain intensity without signs/symptoms of physical distress.      Resistance Training   Training Prescription  Yes    Weight  3 lb    Reps  10-15       Perform Capillary Blood Glucose checks as needed.  Exercise Prescription Changes: Exercise Prescription Changes    Row Name 06/10/19 1600             Response to Exercise   Blood Pressure (Admit)  112/60       Blood Pressure (Exercise)  144/64       Blood Pressure (Exit)  124/58       Heart Rate (Admit)  76 bpm       Heart Rate (Exercise)  86 bpm       Heart Rate (Exit)  66 bpm       Oxygen Saturation (Admit)  94 %       Oxygen Saturation (Exercise)  98 %       Rating of Perceived Exertion (Exercise)  11       Symptoms  wearing slides, shuffle gait, leaning on can and railing, weakness       Comments  walk test results          Exercise Comments:   Exercise Goals and Review: Exercise Goals    Row Name 06/10/19 1636             Exercise Goals   Increase Physical Activity  Yes       Intervention  Provide advice, education, support and counseling about physical activity/exercise needs.;Develop an individualized exercise prescription for aerobic and resistive training based on initial evaluation findings, risk stratification, comorbidities and participant's personal goals.       Expected Outcomes  Long Term: Add in home exercise to make exercise part of routine and to increase amount of physical activity.;Short Term: Attend rehab on a regular basis to increase amount of physical activity.;Long Term: Exercising regularly at least 3-5 days a week.       Increase Strength and Stamina  Yes       Intervention  Provide advice, education, support and counseling about physical activity/exercise needs.;Develop an individualized exercise prescription for aerobic and resistive training based on  initial evaluation findings, risk stratification, comorbidities and participant's personal goals.       Expected Outcomes  Short Term: Increase workloads from initial exercise prescription for resistance, speed, and METs.;Short Term: Perform resistance training exercises routinely during rehab and add in resistance training at home;Long Term: Improve cardiorespiratory fitness, muscular endurance and strength as measured by increased METs and functional capacity (6MWT)       Able to understand and use rate of perceived exertion (RPE) scale  Yes       Intervention  Provide education and explanation on how to use RPE scale       Expected Outcomes  Short Term: Able to use RPE daily in rehab to express subjective intensity level;Long Term:  Able to use RPE to guide intensity level when exercising independently  Able to understand and use Dyspnea scale  Yes       Intervention  Provide education and explanation on how to use Dyspnea scale       Expected Outcomes  Short Term: Able to use Dyspnea scale daily in rehab to express subjective sense of shortness of breath during exertion;Long Term: Able to use Dyspnea scale to guide intensity level when exercising independently       Knowledge and understanding of Target Heart Rate Range (THRR)  Yes       Intervention  Provide education and explanation of THRR including how the numbers were predicted and where they are located for reference       Expected Outcomes  Short Term: Able to state/look up THRR;Short Term: Able to use daily as guideline for intensity in rehab;Long Term: Able to use THRR to govern intensity when exercising independently       Able to check pulse independently  Yes       Intervention  Provide education and demonstration on how to check pulse in carotid and radial arteries.;Review the importance of being able to check your own pulse for safety during independent exercise       Expected Outcomes  Short Term: Able to explain why pulse  checking is important during independent exercise;Long Term: Able to check pulse independently and accurately       Understanding of Exercise Prescription  Yes       Intervention  Provide education, explanation, and written materials on patient's individual exercise prescription       Expected Outcomes  Long Term: Able to explain home exercise prescription to exercise independently;Short Term: Able to explain program exercise prescription          Exercise Goals Re-Evaluation :   Discharge Exercise Prescription (Final Exercise Prescription Changes): Exercise Prescription Changes - 06/10/19 1600      Response to Exercise   Blood Pressure (Admit)  112/60    Blood Pressure (Exercise)  144/64    Blood Pressure (Exit)  124/58    Heart Rate (Admit)  76 bpm    Heart Rate (Exercise)  86 bpm    Heart Rate (Exit)  66 bpm    Oxygen Saturation (Admit)  94 %    Oxygen Saturation (Exercise)  98 %    Rating of Perceived Exertion (Exercise)  11    Symptoms  wearing slides, shuffle gait, leaning on can and railing, weakness    Comments  walk test results       Nutrition:  Target Goals: Understanding of nutrition guidelines, daily intake of sodium <1550m, cholesterol <2086m calories 30% from fat and 7% or less from saturated fats, daily to have 5 or more servings of fruits and vegetables.  Biometrics: Pre Biometrics - 06/10/19 1637      Pre Biometrics   Height  6' 1.1" (1.857 m)    Weight  141 lb 3.2 oz (64 kg)    BMI (Calculated)  18.57        Nutrition Therapy Plan and Nutrition Goals:   Nutrition Assessments: Nutrition Assessments - 06/11/19 0827      MEDFICTS Scores   Pre Score  43       Nutrition Goals Re-Evaluation:   Nutrition Goals Discharge (Final Nutrition Goals Re-Evaluation):   Psychosocial: Target Goals: Acknowledge presence or absence of significant depression and/or stress, maximize coping skills, provide positive support system. Participant is able to  verbalize types and ability to use techniques and skills needed for  reducing stress and depression.   Initial Review & Psychosocial Screening: Initial Psych Review & Screening - 06/07/19 1423      Initial Review   Current issues with  Current Stress Concerns    Source of Stress Concerns  Chronic Illness    Comments  Dementia      Family Dynamics   Good Support System?  Yes   wife handles most of his care     Barriers   Psychosocial barriers to participate in program  There are no identifiable barriers or psychosocial needs.;The patient should benefit from training in stress management and relaxation.      Screening Interventions   Interventions  Encouraged to exercise;To provide support and resources with identified psychosocial needs;Provide feedback about the scores to participant    Expected Outcomes  Short Term goal: Utilizing psychosocial counselor, staff and physician to assist with identification of specific Stressors or current issues interfering with healing process. Setting desired goal for each stressor or current issue identified.;Long Term Goal: Stressors or current issues are controlled or eliminated.;Short Term goal: Identification and review with participant of any Quality of Life or Depression concerns found by scoring the questionnaire.;Long Term goal: The participant improves quality of Life and PHQ9 Scores as seen by post scores and/or verbalization of changes       Quality of Life Scores:  Quality of Life - 06/11/19 0828      Quality of Life   Select  Quality of Life      Quality of Life Scores   Health/Function Pre  20.83 %    Socioeconomic Pre  23.79 %    Psych/Spiritual Pre  27.71 %    Family Pre  26.25 %    GLOBAL Pre  23.58 %      Scores of 19 and below usually indicate a poorer quality of life in these areas.  A difference of  2-3 points is a clinically meaningful difference.  A difference of 2-3 points in the total score of the Quality of Life Index  has been associated with significant improvement in overall quality of life, self-image, physical symptoms, and general health in studies assessing change in quality of life.  PHQ-9: Recent Review Flowsheet Data    Depression screen [facility] 2/9 06/11/2019   Decreased Interest 3   Down, Depressed, Hopeless 2   PHQ - 2 Score 5   Altered sleeping 2   Tired, decreased energy 1   Change in appetite 0   Feeling bad or failure about yourself  3   Trouble concentrating 0   Moving slowly or fidgety/restless 0   Suicidal thoughts 0   PHQ-9 Score 11   Difficult doing work/chores Somewhat difficult     Interpretation of Total Score  Total Score Depression Severity:  1-4 = Minimal depression, 5-9 = Mild depression, 10-14 = Moderate depression, 15-19 = Moderately severe depression, 20-27 = Severe depression   Psychosocial Evaluation and Intervention: Psychosocial Evaluation - 06/07/19 1423      Psychosocial Evaluation & Interventions   Interventions  Stress management education;Relaxation education;Encouraged to exercise with the program and follow exercise prescription    Comments  Kurt [name] has cardiac issues that are being medically managed instead of placing an ICD or performing an ablation. He was diagnosed with dementia. His wife has to keep him on track daily. He enjoys watching TV, but knows he needs cardiac rehab to help him feel better. He reports sleeping well.    Expected Outcomes  Short: attend  cardiac rehab for exercise and educaiton. Long: find positive self care habits       Psychosocial Re-Evaluation:   Psychosocial Discharge (Final Psychosocial Re-Evaluation):   Vocational Rehabilitation: Provide vocational rehab assistance to qualifying candidates.   Vocational Rehab Evaluation & Intervention: Vocational Rehab - 06/07/19 1405      Initial Vocational Rehab Evaluation & Intervention   Assessment shows need for Vocational Rehabilitation  No       Education: Education  Goals: Education classes will be provided on a variety of topics geared toward better understanding of heart health and risk factor modification. Participant will state understanding/return demonstration of topics presented as noted by education test scores.  Learning Barriers/Preferences: Learning Barriers/Preferences - 06/07/19 1405      Learning Barriers/Preferences   Learning Barriers  Exercise Concerns   Memory impairment   Learning Preferences  None       Education Topics:  AED/CPR: - Group verbal and written instruction with the use of models to demonstrate the basic use of the AED with the basic ABC's of resuscitation.   General Nutrition Guidelines/Fats and Fiber: -Group instruction provided by verbal, written material, models and posters to present the general guidelines for heart healthy nutrition. Gives an explanation and review of dietary fats and fiber.   Controlling Sodium/Reading Food Labels: -Group verbal and written material supporting the discussion of sodium use in heart healthy nutrition. Review and explanation with models, verbal and written materials for utilization of the food label.   Exercise Physiology & General Exercise Guidelines: - Group verbal and written instruction with models to review the exercise physiology of the cardiovascular system and associated critical values. Provides general exercise guidelines with specific guidelines to those with heart or lung disease.    Aerobic Exercise & Resistance Training: - Gives group verbal and written instruction on the various components of exercise. Focuses on aerobic and resistive training programs and the benefits of this training and how to safely progress through these programs..   Flexibility, Balance, Mind/Body Relaxation: Provides group verbal/written instruction on the benefits of flexibility and balance training, including mind/body exercise modes such as yoga, pilates and tai chi.  Demonstration  and skill practice provided.   Stress and Anxiety: - Provides group verbal and written instruction about the health risks of elevated stress and causes of high stress.  Discuss the correlation between heart/lung disease and anxiety and treatment options. Review healthy ways to manage with stress and anxiety.   Depression: - Provides group verbal and written instruction on the correlation between heart/lung disease and depressed mood, treatment options, and the stigmas associated with seeking treatment.   Anatomy & Physiology of the Heart: - Group verbal and written instruction and models provide basic cardiac anatomy and physiology, with the coronary electrical and arterial systems. Review of Valvular disease and Heart Failure   Cardiac Procedures: - Group verbal and written instruction to review commonly prescribed medications for heart disease. Reviews the medication, class of the drug, and side effects. Includes the steps to properly store meds and maintain the prescription regimen. (beta blockers and nitrates)   Cardiac Medications I: - Group verbal and written instruction to review commonly prescribed medications for heart disease. Reviews the medication, class of the drug, and side effects. Includes the steps to properly store meds and maintain the prescription regimen.   Cardiac Medications II: -Group verbal and written instruction to review commonly prescribed medications for heart disease. Reviews the medication, class of the drug, and side effects. (all other  drug classes)    Go Sex-Intimacy & Heart Disease, Get SMART - Goal Setting: - Group verbal and written instruction through game format to discuss heart disease and the return to sexual intimacy. Provides group verbal and written material to discuss and apply goal setting through the application of the S.M.A.R.T. Method.   Other Matters of the Heart: - Provides group verbal, written materials and models to describe  Stable Angina and Peripheral Artery. Includes description of the disease process and treatment options available to the cardiac patient.   Exercise & Equipment Safety: - Individual verbal instruction and demonstration of equipment use and safety with use of the equipment.   Cardiac Rehab from 06/10/2019 in [facility] Cardiac and Pulmonary Rehab  Date  06/10/19  Educator  [facility]  Instruction Review Code  1- Verbalizes Understanding      Infection Prevention: - Provides verbal and written material to individual with discussion of infection control including proper hand washing and proper equipment cleaning during exercise session.   [facility]  Date  06/10/19  Educator  [facility]  Instruction Review Code  1- Verbalizes Understanding      Falls Prevention: - Provides verbal and written material to individual with discussion of falls prevention and safety.   Cardiac Rehab from 06/10/2019 in [facility] Cardiac and Pulmonary Rehab  Date  06/10/19  Educator  [facility]  Instruction Review Code  1- Verbalizes Understanding      Diabetes: - Individual verbal and written instruction to review signs/symptoms of diabetes, desired ranges of glucose level fasting, after meals and with exercise. Acknowledge that pre and post exercise glucose checks will be done for 3 sessions at entry of program.   Cardiac Rehab from 06/10/2019 in [facility] Cardiac and Pulmonary Rehab  Date  06/07/19  Educator  [facility]  Instruction Review Code  1- Verbalizes Understanding      Know Your Numbers and Risk Factors: -Group verbal and written instruction about important numbers in your health.  Discussion of what are risk factors and how they play a role in the disease process.  Review of Cholesterol, Blood Pressure, Diabetes, and BMI and the role they play in your overall health.   Sleep Hygiene: -Provides group verbal and written instruction about how sleep can affect your health.  Define sleep  hygiene, discuss sleep cycles and impact of sleep habits. Review good sleep hygiene tips.    Other: -Provides group and verbal instruction on various topics (see comments)   Knowledge Questionnaire Score: Knowledge Questionnaire Score - 06/11/19 0827      Knowledge Questionnaire Score   Pre Score  19/26 Education Focus; A&P, Nutrition, Exercise,       Core Components/Risk Factors/Patient Goals at Admission: Personal Goals and Risk Factors at Admission - 06/10/19 1637      Core Components/Risk Factors/Patient Goals on Admission    Weight Management  Yes;Weight Gain    Intervention  Weight Management: Develop a combined nutrition and exercise program designed to reach desired caloric intake, while maintaining appropriate intake of nutrient and fiber, sodium and fats, and appropriate energy expenditure required for the weight goal.;Weight Management: Provide education and appropriate resources to help participant work on and attain dietary goals.    Admit Weight  141 lb 3.2 oz (64 kg)    Goal Weight: Short Term  145 lb (65.8 kg)    Goal Weight: Long Term  150 lb (68 kg)    Expected Outcomes  Short Term: Continue  to assess and modify interventions until short term weight is achieved;Long Term: Adherence to nutrition and physical activity/exercise program aimed toward attainment of established weight goal;Weight Gain: Understanding of general recommendations for a high calorie, high protein meal plan that promotes weight gain by distributing calorie intake throughout the day with the consumption for 4-5 meals, snacks, and/or supplements    Tobacco Cessation  Yes    Number of packs per day  1.5 packs    Intervention  Assist the participant in steps to quit. Provide individualized education and counseling about committing to Tobacco Cessation, relapse prevention, and pharmacological support that can be provided by physician.;Advice worker, assist with locating and accessing  local/national Quit Smoking programs, and support quit date choice.    Expected Outcomes  Short Term: Will demonstrate readiness to quit, by selecting a quit date.;Short Term: Will quit all tobacco product use, adhering to prevention of relapse plan.;Long Term: Complete abstinence from all tobacco products for at least 12 months from quit date.    Diabetes  Yes    Intervention  Provide education about signs/symptoms and action to take for hypo/hyperglycemia.;Provide education about proper nutrition, including hydration, and aerobic/resistive exercise prescription along with prescribed medications to achieve blood glucose in normal ranges: Fasting glucose 65-99 mg/dL    Expected Outcomes  Short Term: Participant verbalizes understanding of the signs/symptoms and immediate care of hyper/hypoglycemia, proper foot care and importance of medication, aerobic/resistive exercise and nutrition plan for blood glucose control.;Long Term: Attainment of HbA1C < 7%.    Heart Failure  Yes    Intervention  Provide a combined exercise and nutrition program that is supplemented with education, support and counseling about heart failure. Directed toward relieving symptoms such as shortness of breath, decreased exercise tolerance, and extremity edema.    Expected Outcomes  Improve functional capacity of life;Short term: Attendance in program 2-3 days a week with increased exercise capacity. Reported lower sodium intake. Reported increased fruit and vegetable intake. Reports medication compliance.;Short term: Daily weights obtained and reported for increase. Utilizing diuretic protocols set by physician.;Long term: Adoption of self-care skills and reduction of barriers for early signs and symptoms recognition and intervention leading to self-care maintenance.    Hypertension  Yes    Intervention  Provide education on lifestyle modifcations including regular physical activity/exercise, weight management, moderate sodium  restriction and increased consumption of fresh fruit, vegetables, and low fat dairy, alcohol moderation, and smoking cessation.;Monitor prescription use compliance.    Expected Outcomes  Short Term: Continued assessment and intervention until BP is < 140/37m HG in hypertensive participants. < 130/825mHG in hypertensive participants with diabetes, heart failure or chronic kidney disease.;Long Term: Maintenance of blood pressure at goal levels.    Lipids  Yes    Intervention  Provide education and support for participant on nutrition & aerobic/resistive exercise along with prescribed medications to achieve LDL '70mg'$ , HDL >'40mg'$ .    Expected Outcomes  Short Term: Participant states understanding of desired cholesterol values and is compliant with medications prescribed. Participant is following exercise prescription and nutrition guidelines.;Long Term: Cholesterol controlled with medications as prescribed, with individualized exercise RX and with personalized nutrition plan. Value goals: LDL < '70mg'$ , HDL > 40 mg.       Core Components/Risk Factors/Patient Goals Review:    Core Components/Risk Factors/Patient Goals at Discharge (Final Review):    ITP Comments: ITP Comments    Row Name 06/07/19 1420 06/10/19 1632 06/19/19 0611 06/20/19 0854     ITP Comments  Initial  phone orientation completed. Diagnosis can be found in [facility] 12/11. EP orientation scheduled for 2/15 at 3pm  Completed 6MWT and gym orientation.  Initial ITP created and sent for review to Dr. Emily Filbert, Medical Director.  30 day chart review completed. ITP sent to Dr Zachery Dakins Medical Director, for review,changes as needed and signature. New to program  Rajveer has had three falls over the weekend and did not follow up with his doctor.  He was not able to walk very far on his 6MWT and holding on to wall.  We talked with his 67 office and feel that a PT referral for balance would better for him at this time prior to rehab.  We  will discharge him at this time.       Comments: Discharge ITP

## 2019-06-24 ENCOUNTER — Ambulatory Visit: Payer: Medicare PPO

## 2019-06-24 DEATH — deceased

## 2019-06-26 ENCOUNTER — Ambulatory Visit: Payer: Medicare PPO

## 2019-06-27 ENCOUNTER — Ambulatory Visit: Payer: Medicare PPO

## 2019-07-01 ENCOUNTER — Ambulatory Visit: Payer: Medicare PPO

## 2019-07-03 ENCOUNTER — Ambulatory Visit: Payer: Medicare PPO

## 2019-07-04 ENCOUNTER — Ambulatory Visit: Payer: Medicare PPO

## 2019-07-08 ENCOUNTER — Ambulatory Visit: Payer: Medicare PPO

## 2019-07-10 ENCOUNTER — Ambulatory Visit: Payer: Medicare PPO

## 2019-07-11 ENCOUNTER — Ambulatory Visit: Payer: Medicare PPO

## 2019-07-15 ENCOUNTER — Ambulatory Visit: Payer: Medicare PPO

## 2019-07-17 ENCOUNTER — Ambulatory Visit: Payer: Medicare PPO

## 2019-07-18 ENCOUNTER — Ambulatory Visit: Payer: Medicare PPO

## 2019-07-22 ENCOUNTER — Ambulatory Visit: Payer: Medicare PPO

## 2019-07-24 ENCOUNTER — Ambulatory Visit: Payer: Medicare PPO

## 2019-07-25 ENCOUNTER — Ambulatory Visit: Payer: Medicare PPO

## 2019-07-29 ENCOUNTER — Ambulatory Visit: Payer: Medicare PPO

## 2019-07-31 ENCOUNTER — Ambulatory Visit: Payer: Medicare PPO

## 2019-08-01 ENCOUNTER — Ambulatory Visit: Payer: Medicare PPO

## 2019-08-05 ENCOUNTER — Ambulatory Visit: Payer: Medicare PPO

## 2019-08-07 ENCOUNTER — Ambulatory Visit: Payer: Medicare PPO

## 2019-08-08 ENCOUNTER — Ambulatory Visit: Payer: Medicare PPO

## 2019-08-12 ENCOUNTER — Ambulatory Visit: Payer: Medicare PPO

## 2019-08-14 ENCOUNTER — Ambulatory Visit: Payer: Medicare PPO

## 2019-08-15 ENCOUNTER — Ambulatory Visit: Payer: Medicare PPO

## 2019-08-19 ENCOUNTER — Ambulatory Visit: Payer: Medicare PPO

## 2019-08-21 ENCOUNTER — Ambulatory Visit: Payer: Medicare PPO

## 2019-08-22 ENCOUNTER — Ambulatory Visit: Payer: Medicare PPO

## 2019-08-26 ENCOUNTER — Ambulatory Visit: Payer: Medicare PPO

## 2019-08-28 ENCOUNTER — Ambulatory Visit: Payer: Medicare PPO

## 2019-08-29 ENCOUNTER — Ambulatory Visit: Payer: Medicare PPO

## 2019-09-02 ENCOUNTER — Ambulatory Visit: Payer: Medicare PPO

## 2019-09-04 ENCOUNTER — Ambulatory Visit: Payer: Medicare PPO

## 2019-09-05 ENCOUNTER — Ambulatory Visit: Payer: Medicare PPO

## 2019-09-09 ENCOUNTER — Ambulatory Visit: Payer: Medicare PPO

## 2019-09-11 ENCOUNTER — Ambulatory Visit: Payer: Medicare PPO

## 2019-09-12 ENCOUNTER — Ambulatory Visit: Payer: Medicare PPO

## 2019-09-16 ENCOUNTER — Ambulatory Visit: Payer: Medicare PPO

## 2019-09-18 ENCOUNTER — Ambulatory Visit: Payer: Medicare PPO

## 2019-09-19 ENCOUNTER — Ambulatory Visit: Payer: Medicare PPO

## 2019-09-25 ENCOUNTER — Ambulatory Visit: Payer: Medicare PPO

## 2019-09-26 ENCOUNTER — Ambulatory Visit: Payer: Medicare PPO

## 2019-09-30 ENCOUNTER — Ambulatory Visit: Payer: Medicare PPO

## 2019-10-02 ENCOUNTER — Ambulatory Visit: Payer: Medicare PPO

## 2019-10-03 ENCOUNTER — Ambulatory Visit: Payer: Medicare PPO

## 2019-10-07 ENCOUNTER — Ambulatory Visit: Payer: Medicare PPO

## 2019-11-22 ENCOUNTER — Other Ambulatory Visit (INDEPENDENT_AMBULATORY_CARE_PROVIDER_SITE_OTHER): Payer: Medicare Other

## 2019-11-22 ENCOUNTER — Encounter (INDEPENDENT_AMBULATORY_CARE_PROVIDER_SITE_OTHER): Payer: Medicare Other

## 2021-05-05 IMAGING — CT CT HEAD W/O CM
4 series · 17 of 47 positions shown, 19 images · non-contrast
Comparison: 02/28/2018

CLINICAL DATA: Altered mental status

EXAM:
CT HEAD WITHOUT CONTRAST
TECHNIQUE: Contiguous axial images were obtained from the base of the skull
through the vertex without intravenous contrast.

[Series 2: head wo · axial · 0.42mm/px · z∈[+895,+1015]mm · 7 of 32 slices shown, 9 images]
[im 4/32  brain]
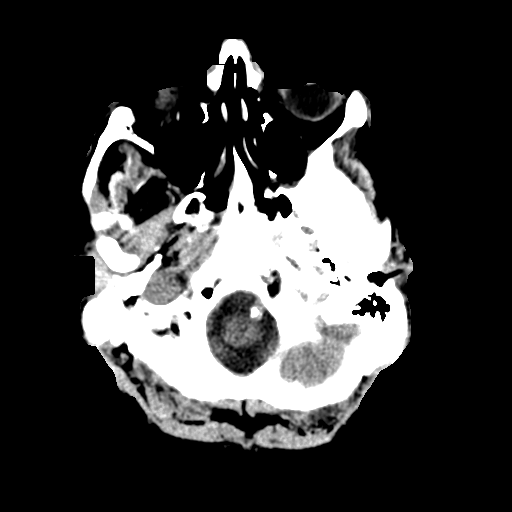
[im 4/32  bone]
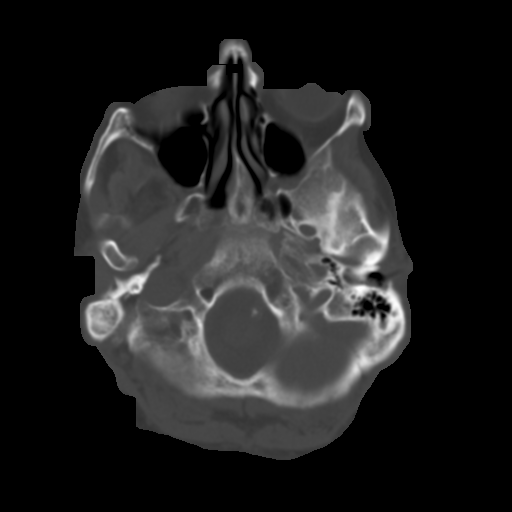
[im 8/32  brain]
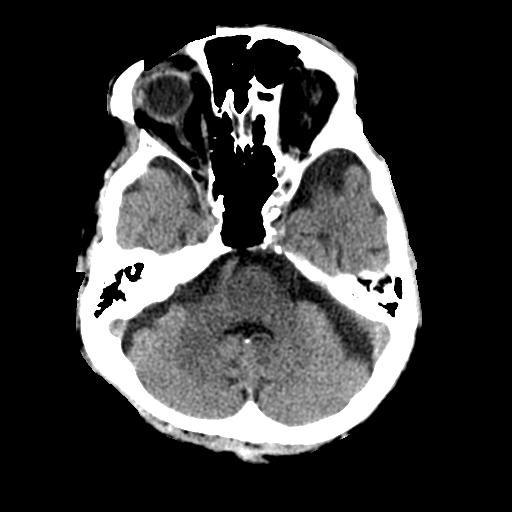
[im 12/32  brain]
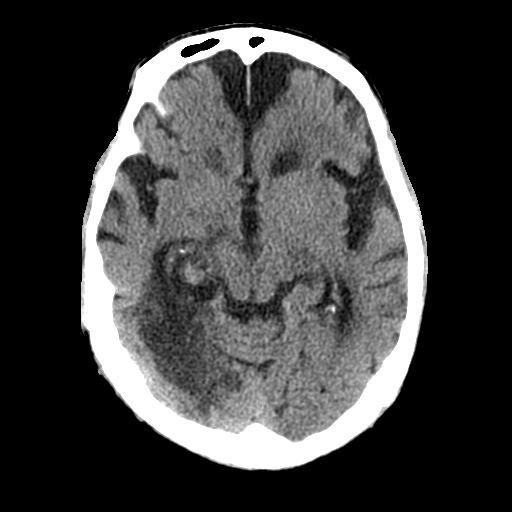
[im 16/32  brain]
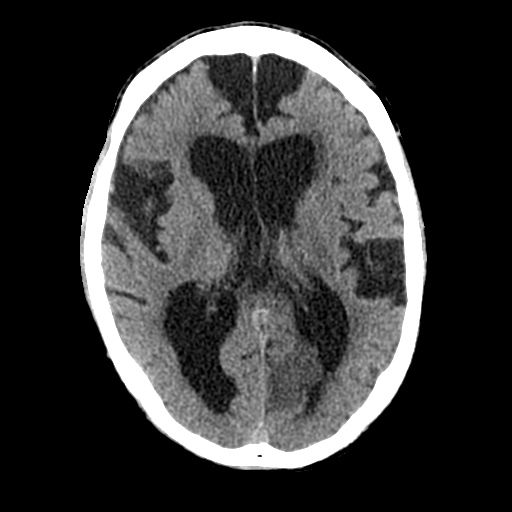
[im 20/32  brain]
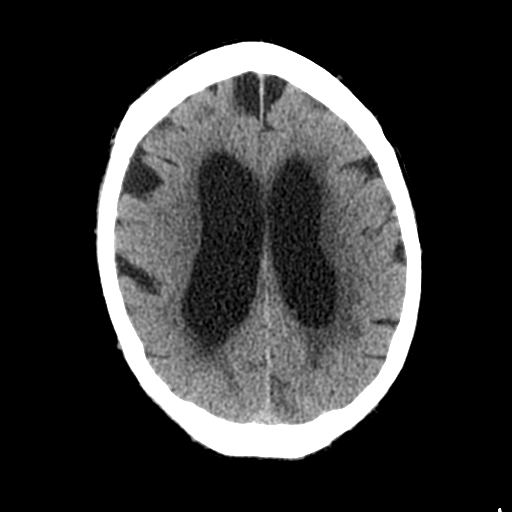
[im 20/32  bone]
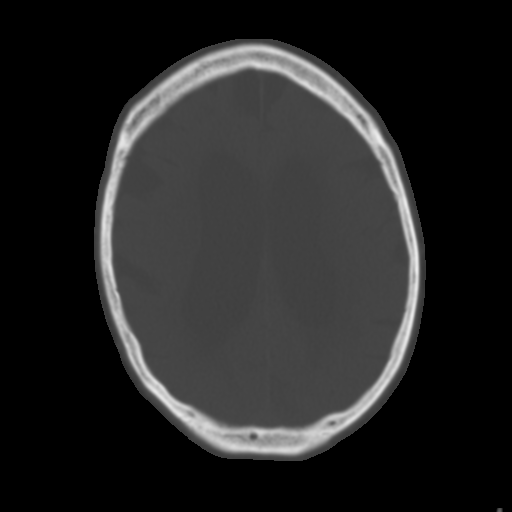
[im 24/32  brain]
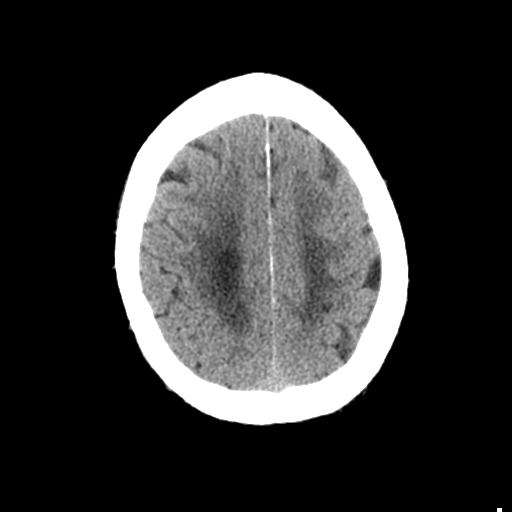
[im 28/32  brain]
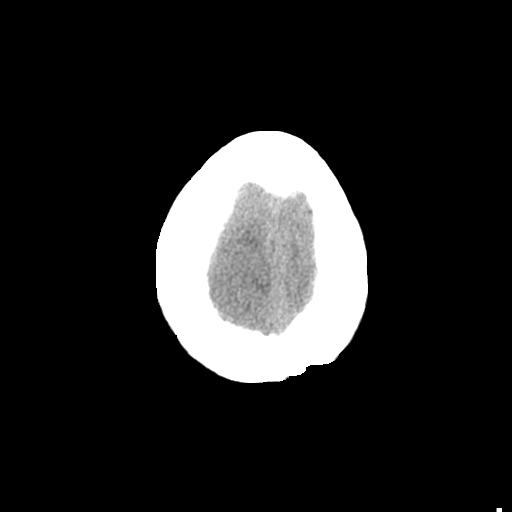

[Series 3: head bone · axial · 0.42mm/px · z∈[+894,+950]mm · 4 of 79 slices shown]
[im 8/79  bone]
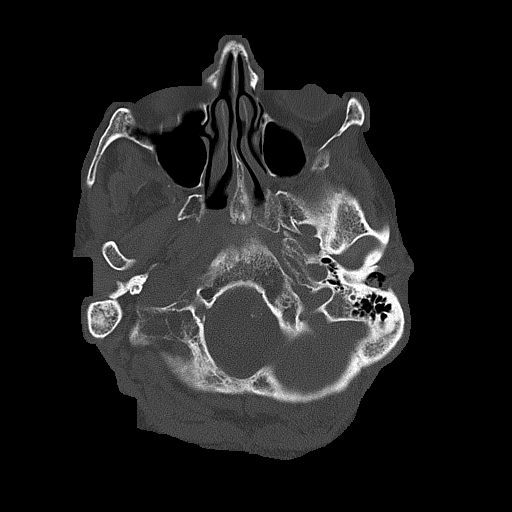
[im 16/79  bone]
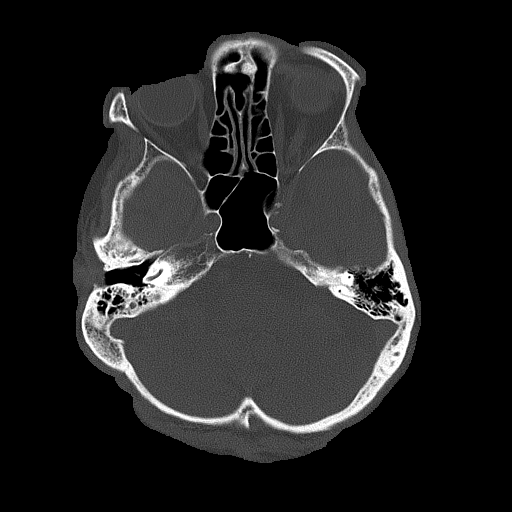
[im 24/79  bone]
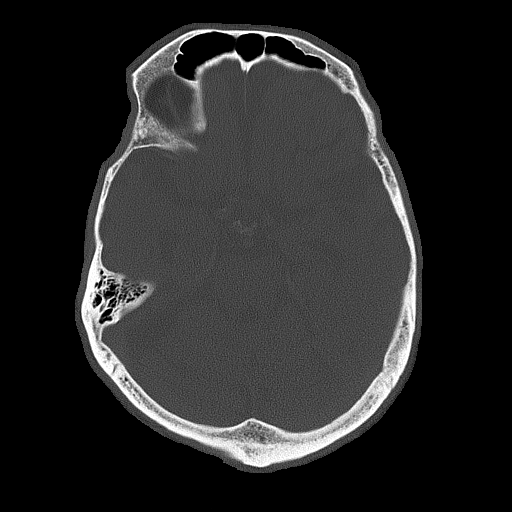
[im 36/79  bone]
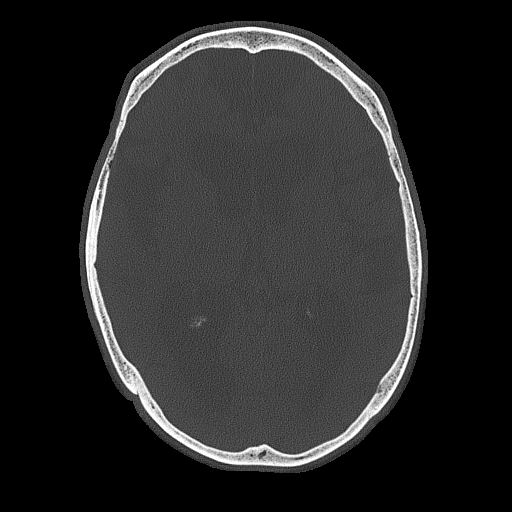

[Series 4: coronal soft tissue · coronal · 0.32mm/px · 3 of 68 slices shown]
[im 23/68  brain]
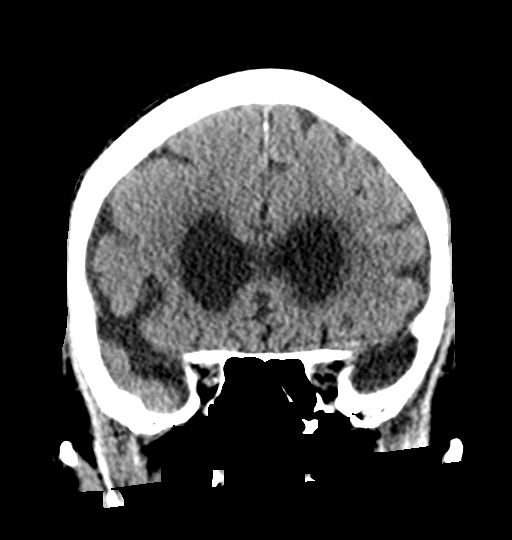
[im 30/68  brain]
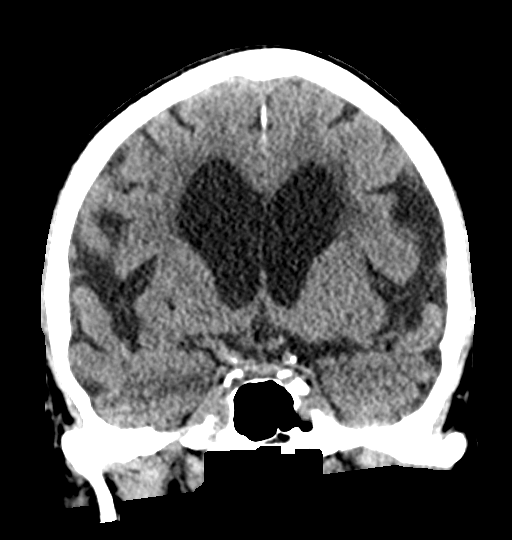
[im 38/68  brain]
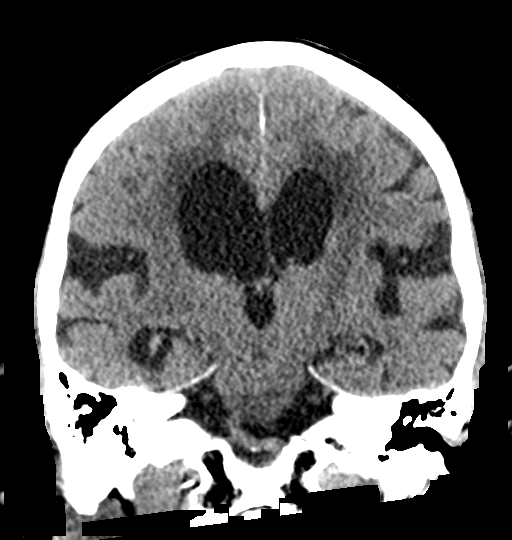

[Series 5: sagittal soft tissue · sagittal · 0.34mm/px · 3 of 55 slices shown]
[im 19/55  brain]
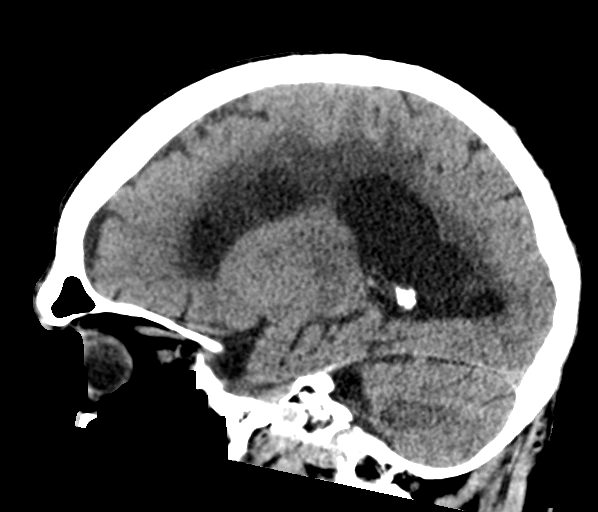
[im 28/55  brain]
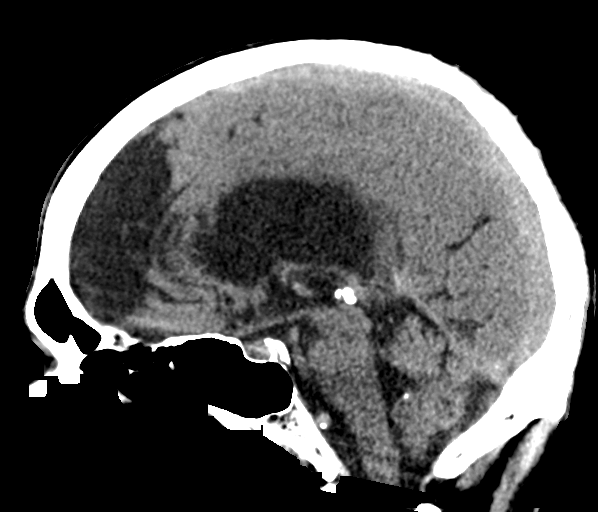
[im 36/55  brain]
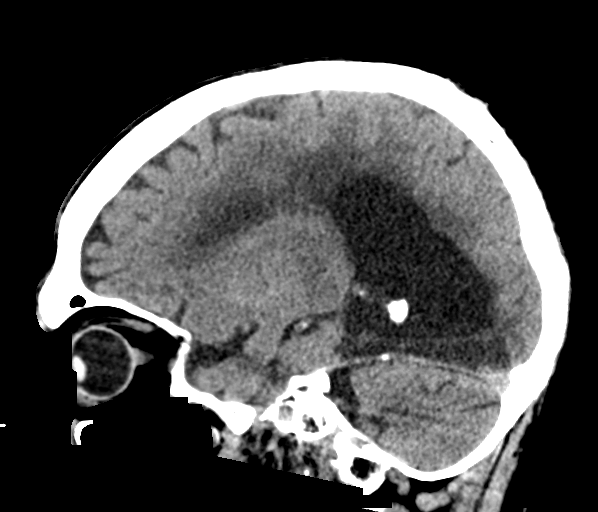

[17 of 47 positions shown; findings below may reference images not displayed]

FINDINGS: Brain: No evidence of acute infarction, hemorrhage, extra-axial
collection, ventriculomegaly, or mass effect. Old right occipital
infarct with encephalomalacia. Generalized cerebral atrophy.
Periventricular white matter low attenuation likely secondary to
microangiopathy.

Vascular: Cerebrovascular atherosclerotic calcifications are noted.

Skull: Negative for fracture or focal lesion.

Sinuses/Orbits: Visualized portions of the orbits are unremarkable.
Visualized portions of the paranasal sinuses and mastoid air cells
are unremarkable.

Other: None.
IMPRESSION: No acute intracranial pathology.
